# Patient Record
Sex: Female | Born: 1990
Health system: Southern US, Community
[De-identification: ages and names within clinical notes are randomized; demographics above are authoritative.]

## PROBLEM LIST (undated history)

## (undated) DIAGNOSIS — F329 Major depressive disorder, single episode, unspecified: Secondary | ICD-10-CM

## (undated) DIAGNOSIS — F32A Depression, unspecified: Secondary | ICD-10-CM

## (undated) DIAGNOSIS — G43909 Migraine, unspecified, not intractable, without status migrainosus: Secondary | ICD-10-CM

## (undated) DIAGNOSIS — R7303 Prediabetes: Secondary | ICD-10-CM

## (undated) DIAGNOSIS — F419 Anxiety disorder, unspecified: Secondary | ICD-10-CM

## (undated) DIAGNOSIS — E669 Obesity, unspecified: Secondary | ICD-10-CM

## (undated) DIAGNOSIS — K3184 Gastroparesis: Secondary | ICD-10-CM

## (undated) DIAGNOSIS — M797 Fibromyalgia: Secondary | ICD-10-CM

## (undated) HISTORY — DX: Gastroparesis: K31.84

## (undated) HISTORY — DX: Migraine, unspecified, not intractable, without status migrainosus: G43.909

## (undated) HISTORY — PX: WISDOM TOOTH EXTRACTION: SHX21

---
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
## 2008-07-13 ENCOUNTER — Other Ambulatory Visit: Admission: RE | Admit: 2008-07-13 | Discharge: 2008-07-13 | Payer: Self-pay | Admitting: Obstetrics and Gynecology

## 2010-09-21 ENCOUNTER — Emergency Department (HOSPITAL_COMMUNITY)
Admission: EM | Admit: 2010-09-21 | Discharge: 2010-09-21 | Disposition: A | Discharge status: Home / Self Care | Payer: BC Managed Care – PPO | Attending: Emergency Medicine | Admitting: Emergency Medicine

## 2010-09-21 DIAGNOSIS — S335XXA Sprain of ligaments of lumbar spine, initial encounter: Secondary | ICD-10-CM | POA: Insufficient documentation

## 2010-09-21 DIAGNOSIS — X58XXXA Exposure to other specified factors, initial encounter: Secondary | ICD-10-CM | POA: Insufficient documentation

## 2010-09-21 DIAGNOSIS — N390 Urinary tract infection, site not specified: Secondary | ICD-10-CM | POA: Insufficient documentation

## 2010-09-21 DIAGNOSIS — Y998 Other external cause status: Secondary | ICD-10-CM | POA: Insufficient documentation

## 2010-09-21 DIAGNOSIS — F17200 Nicotine dependence, unspecified, uncomplicated: Secondary | ICD-10-CM | POA: Insufficient documentation

## 2010-09-21 LAB — URINALYSIS, ROUTINE W REFLEX MICROSCOPIC
Bilirubin Urine: NEGATIVE
Hgb urine dipstick: NEGATIVE
Nitrite: NEGATIVE
Protein, ur: NEGATIVE mg/dL
Specific Gravity, Urine: 1.02 (ref 1.005–1.030)
Urobilinogen, UA: 0.2 mg/dL (ref 0.0–1.0)

## 2010-09-21 LAB — POCT PREGNANCY, URINE: Preg Test, Ur: NEGATIVE

## 2010-09-21 LAB — URINE MICROSCOPIC-ADD ON

## 2011-03-27 ENCOUNTER — Emergency Department (HOSPITAL_COMMUNITY)
Admission: EM | Admit: 2011-03-27 | Discharge: 2011-03-27 | Disposition: A | Discharge status: Home / Self Care | Payer: BC Managed Care – PPO | Attending: Emergency Medicine | Admitting: Emergency Medicine

## 2011-03-27 ENCOUNTER — Encounter: Payer: Self-pay | Admitting: Emergency Medicine

## 2011-03-27 DIAGNOSIS — H6690 Otitis media, unspecified, unspecified ear: Secondary | ICD-10-CM | POA: Insufficient documentation

## 2011-03-27 DIAGNOSIS — J3489 Other specified disorders of nose and nasal sinuses: Secondary | ICD-10-CM | POA: Insufficient documentation

## 2011-03-27 DIAGNOSIS — E669 Obesity, unspecified: Secondary | ICD-10-CM | POA: Insufficient documentation

## 2011-03-27 DIAGNOSIS — R51 Headache: Secondary | ICD-10-CM | POA: Insufficient documentation

## 2011-03-27 DIAGNOSIS — H9209 Otalgia, unspecified ear: Secondary | ICD-10-CM | POA: Insufficient documentation

## 2011-03-27 HISTORY — DX: Obesity, unspecified: E66.9

## 2011-03-27 HISTORY — DX: Anxiety disorder, unspecified: F41.9

## 2011-03-27 HISTORY — DX: Depression, unspecified: F32.A

## 2011-03-27 HISTORY — DX: Major depressive disorder, single episode, unspecified: F32.9

## 2011-03-27 MED ORDER — OXYCODONE-ACETAMINOPHEN 5-325 MG PO TABS
1.0000 | ORAL_TABLET | Freq: Once | ORAL | Status: AC
  Administered 2011-03-27: 1 via ORAL
  Filled 2011-03-27: qty 1

## 2011-03-27 MED ORDER — AMOXICILLIN-POT CLAVULANATE 875-125 MG PO TABS
1.0000 | ORAL_TABLET | ORAL | Status: AC
  Administered 2011-03-27: 1 via ORAL
  Filled 2011-03-27: qty 1

## 2011-03-27 MED ORDER — HYDROCODONE-ACETAMINOPHEN 5-325 MG PO TABS: 2.0000 | ORAL_TABLET | ORAL | Status: AC | PRN

## 2011-03-27 MED ORDER — ANTIPYRINE-BENZOCAINE 5.4-1.4 % OT SOLN
3.0000 [drp] | Freq: Once | OTIC | Status: AC
  Administered 2011-03-27: 4 [drp] via OTIC
  Filled 2011-03-27: qty 10

## 2011-03-27 MED ORDER — AMOXICILLIN-POT CLAVULANATE 875-125 MG PO TABS: 1.0000 | ORAL_TABLET | Freq: Two times a day (BID) | ORAL | Status: AC

## 2011-03-27 NOTE — ED Notes (Signed)
PT. REPORTS EAR ACHE THIS EVENING , NO DRAINAGE / DENIES INJURY , NO FEVER OR CHILLS.

## 2011-03-27 NOTE — ED Provider Notes (Signed)
Medical screening examination/treatment/procedure(s) were performed by non-physician practitioner and as supervising physician I was immediately available for consultation/collaboration.  Jamese Trauger M Corry Storie, MD 03/27/11 1749 

## 2011-03-27 NOTE — ED Notes (Signed)
NP at bedside.

## 2011-03-27 NOTE — ED Provider Notes (Signed)
History     CSN: [id number] Arrival date & time: 03/27/2011 12:58 AM   First MD Initiated Contact with Patient 03/27/11 0110      Chief Complaint  Patient presents with  . Otalgia    (Consider location/radiation/quality/duration/timing/severity/associated sxs/prior treatment) HPI Comments: Per the spouse.  She has chronic ear infections, reports she's had rhinitis for the past week, and about one hour ago, developed severe stabbing right ear pain.  She took 3 ibuprofen prior to arrival without any relief.  Crying in pain rocking holding her head  Patient is a 20 y.o. female presenting with ear pain. The history is provided by the patient.  Otalgia This is a new problem. The current episode started 1 to 2 hours ago. There is pain in the right ear. The problem occurs constantly. The problem has been gradually worsening. There has been no fever. The pain is at a severity of 10/10. The pain is severe. Associated symptoms include headaches and rhinorrhea. Pertinent negatives include no ear discharge, no hearing loss and no sore throat. Her past medical history is significant for chronic ear infection.    Past Medical History  Diagnosis Date  . Anxiety   . Depression   . Obesity     History reviewed. No pertinent past surgical history.  No family history on file.  History  Substance Use Topics  . Smoking status: Current Everyday Smoker  . Smokeless tobacco: Not on file  . Alcohol Use: No    OB History    Grav Para Term Preterm Abortions TAB SAB Ect Mult Living                  Review of Systems  Constitutional: Negative.  Negative for fever.  HENT: Positive for ear pain and rhinorrhea. Negative for hearing loss, sore throat, drooling and ear discharge.   Eyes: Negative.   Respiratory: Negative.   Cardiovascular: Negative.   Genitourinary: Negative.   Neurological: Positive for headaches.  Psychiatric/Behavioral: Negative.     Allergies  Sulfa antibiotics  Home  Medications   Current Outpatient Rx  Name Route Sig Dispense Refill  . BUPROPION HCL ER (XL) 150 MG PO TB24 Oral Take 150 mg by mouth daily.      Marland Kitchen CLONAZEPAM 1 MG PO TABS Oral Take 1 mg by mouth 3 (three) times daily as needed. For anxiety     . FLUOXETINE HCL 20 MG PO CAPS Oral Take 40 mg by mouth daily.      . AMOXICILLIN-POT CLAVULANATE 875-125 MG PO TABS Oral Take 1 tablet by mouth every 12 (twelve) hours. 14 tablet 0  . HYDROCODONE-ACETAMINOPHEN 5-325 MG PO TABS Oral Take 2 tablets by mouth every 4 (four) hours as needed for pain. 10 tablet 0    BP 156/101  Pulse 93  Temp(Src) 97.5 F (36.4 C) (Oral)  Resp 20  SpO2 95%  LMP 03/26/2011  Physical Exam  Constitutional: She is oriented to person, place, and time. She appears well-developed and well-nourished.  HENT:  Right Ear: There is swelling. No drainage or tenderness. No mastoid tenderness. Tympanic membrane is injected. A middle ear effusion is present. No hemotympanum.  Left Ear: Tympanic membrane normal.  Neck: Normal range of motion.  Cardiovascular: Normal rate.   Pulmonary/Chest: Effort normal.  Musculoskeletal: Normal range of motion.  Neurological: She is oriented to person, place, and time.  Skin: Skin is warm and dry.  Psychiatric: She has a normal mood and affect.    ED Course  Procedures (including critical care time)  Labs Reviewed - No data to display No results found.   1. Otitis media       MDM  URI with right middle ear effusion.  Pain.  Will treat with by mouth antibiotics, refer patient to ENT for followup        Arman Filter, NP 03/27/11 0138  Arman Filter, NP 03/27/11 0140

## 2014-09-03 ENCOUNTER — Encounter (INDEPENDENT_AMBULATORY_CARE_PROVIDER_SITE_OTHER): Payer: Self-pay

## 2014-09-03 ENCOUNTER — Encounter: Payer: Self-pay | Admitting: Primary Care

## 2014-09-03 ENCOUNTER — Ambulatory Visit (INDEPENDENT_AMBULATORY_CARE_PROVIDER_SITE_OTHER): Payer: BLUE CROSS/BLUE SHIELD | Admitting: Primary Care

## 2014-09-03 ENCOUNTER — Ambulatory Visit (INDEPENDENT_AMBULATORY_CARE_PROVIDER_SITE_OTHER)
Admission: RE | Admit: 2014-09-03 | Discharge: 2014-09-03 | Disposition: A | Discharge status: Home / Self Care | Payer: BLUE CROSS/BLUE SHIELD | Source: Ambulatory Visit | Attending: Primary Care | Admitting: Primary Care

## 2014-09-03 VITALS — BP 122/72 | HR 81 | Temp 98.5°F | Ht 67.5 in | Wt 190.1 lb

## 2014-09-03 DIAGNOSIS — F419 Anxiety disorder, unspecified: Secondary | ICD-10-CM

## 2014-09-03 DIAGNOSIS — G43001 Migraine without aura, not intractable, with status migrainosus: Secondary | ICD-10-CM

## 2014-09-03 DIAGNOSIS — M545 Low back pain, unspecified: Secondary | ICD-10-CM | POA: Insufficient documentation

## 2014-09-03 DIAGNOSIS — M5442 Lumbago with sciatica, left side: Secondary | ICD-10-CM

## 2014-09-03 DIAGNOSIS — M797 Fibromyalgia: Secondary | ICD-10-CM | POA: Insufficient documentation

## 2014-09-03 DIAGNOSIS — M791 Myalgia, unspecified site: Secondary | ICD-10-CM

## 2014-09-03 DIAGNOSIS — M5441 Lumbago with sciatica, right side: Secondary | ICD-10-CM

## 2014-09-03 DIAGNOSIS — F329 Major depressive disorder, single episode, unspecified: Secondary | ICD-10-CM

## 2014-09-03 DIAGNOSIS — M2550 Pain in unspecified joint: Secondary | ICD-10-CM | POA: Diagnosis not present

## 2014-09-03 DIAGNOSIS — F418 Other specified anxiety disorders: Secondary | ICD-10-CM

## 2014-09-03 DIAGNOSIS — G43809 Other migraine, not intractable, without status migrainosus: Secondary | ICD-10-CM

## 2014-09-03 DIAGNOSIS — G43909 Migraine, unspecified, not intractable, without status migrainosus: Secondary | ICD-10-CM | POA: Insufficient documentation

## 2014-09-03 DIAGNOSIS — F32A Depression, unspecified: Secondary | ICD-10-CM

## 2014-09-03 MED ORDER — AMITRIPTYLINE HCL 25 MG PO TABS: 25.0000 mg | ORAL_TABLET | Freq: Every day | ORAL | Status: DC

## 2014-09-03 MED ORDER — KETOROLAC TROMETHAMINE 60 MG/2ML IM SOLN
60.0000 mg | Freq: Once | INTRAMUSCULAR | Status: AC
  Administered 2014-09-03: 60 mg via INTRAMUSCULAR

## 2014-09-03 NOTE — Progress Notes (Signed)
Subjective:    Patient ID: Melissa Mendez, female    DOB: Nov 26, 1990, 26 y.o.   MRN: [id number]  HPI  Ms. Melissa Mendez is a 24 year old female who presents today to establish care and discuss the problems mentioned below. Will obtain old records.  1) Anxiety and Depression: Diagnosed in 2007. She was taking fluoxetine but has not had for several months. Follows with Dr. Toy Care and has an appointment next month. Denies SI/HI. She does not follow with therapist. Currently taking Xanax every night and sometimes twice-three times daily which is managed by Dr. Toy Care.  2) Migraines: Present for the past 4-5 years. She will get photophobia, phonophobia, and nausea; and will get them once-twice monthly.  She will take excedrin migraine will help if she catches the headache early enough. She Topomax in the past for prevention of headacehs without help, but had not tried TCA's or beta blockers.  3) Elevated blood pressure readings: History of, but lost 85 pounds and has not had any elevated readings since.  4) Joint pain/Muscle pain: Present to bilateral for the past 4-5 years. History of back pain in 2009 with diagnosis of bulging disc, but was not a candidate for surgery. Reports radiculopathy occasionally now, but more frequently when she weighed more. Back pain present from top of neck down to lumbar spine. She was seeing Dr. Nevada Crane in [place] who referred her to an orthopedist. She was last seen at [facility] 1 year ago and had a normal pelvic MRI. She has been taking gabapentin with some relief. Over the years her pain has worsened, especially over past few months. She has multiple pain/tender points present to her neck, back, hips. Denies history of inflammatory disorders, and has also never been tested.  Review of Systems  Constitutional: Positive for fatigue. Negative for unexpected weight change.  HENT: Negative for rhinorrhea.   Respiratory: Negative for cough and shortness of breath.     Cardiovascular: Negative for chest pain.  Gastrointestinal: Negative for diarrhea and constipation.  Genitourinary: Negative for dysuria and frequency.  Skin: Negative for rash.  Allergic/Immunologic: Positive for environmental allergies.  Neurological: Positive for headaches. Negative for dizziness.  Psychiatric/Behavioral:       See HPI       Past Medical History  Diagnosis Date  . Anxiety   . Depression   . Obesity   . Migraines     History   Social History  . Marital Status: Unknown    Spouse Name: N/A  . Number of Children: N/A  . Years of Education: N/A   Occupational History  . Not on file.   Social History Main Topics  . Smoking status: Current Every Day Smoker  . Smokeless tobacco: Not on file  . Alcohol Use: No  . Drug Use: No  . Sexual Activity: Not on file   Other Topics Concern  . Not on file   Social History Narrative   Single.   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   Enjoys painting, being with her friends and boyfriend.          Past Surgical History  Procedure Laterality Date  . Tonsillectomy and adenoidectomy  1993  . Wisdom tooth extraction      Family History  Problem Relation Age of Onset  . Hypertension Father     Allergies  Allergen Reactions  . Sulfa Antibiotics Other (See Comments)    unknown    Current Outpatient  Prescriptions on File Prior to Visit  Medication Sig Dispense Refill  . FLUoxetine (PROZAC) 20 MG capsule Take 40 mg by mouth daily.       No current facility-administered medications on file prior to visit.    BP 122/72 mmHg  Pulse 81  Temp(Src) 98.5 F (36.9 C) (Oral)  Ht 5' 7.5" (1.715 m)  Wt 190 lb 1.9 oz (86.238 kg)  BMI 29.32 kg/m2  SpO2 98%  LMP 08/19/2014    Objective:   Physical Exam  Constitutional: She is oriented to person, place, and time. She appears well-nourished.  HENT:  Right Ear: Tympanic membrane and ear canal normal.  Left Ear: Tympanic  membrane and ear canal normal.  Nose: Nose normal.  Mouth/Throat: Oropharynx is clear and moist.  Eyes: Conjunctivae and EOM are normal. Pupils are equal, round, and reactive to light.  Neck: Neck supple. No thyromegaly present.  Cardiovascular: Normal rate and regular rhythm.   Pulmonary/Chest: Effort normal and breath sounds normal.  Abdominal: Soft. Bowel sounds are normal. There is no tenderness.  Musculoskeletal:       Right hip: She exhibits tenderness.       Left hip: She exhibits tenderness.       Cervical back: She exhibits tenderness.       Thoracic back: She exhibits tenderness.       Lumbar back: She exhibits tenderness.  Multiple pain/tender points present to:  Posterior: neck bilaterally, mid thoracic bilaterally, lower back bilaterally, hips bilaterally. Anterior: hips bilaterally Total 11 painful/tender points.  Lymphadenopathy:    She has no cervical adenopathy.  Neurological: She is alert and oriented to person, place, and time.  Skin: Skin is warm and dry.  Psychiatric: She has a normal mood and affect.          Assessment & Plan:

## 2014-09-03 NOTE — Patient Instructions (Signed)
Complete lab work prior to leaving today. I will notify you of your results. Complete xray(s) prior to leaving today. I will contact you regarding your results. Stop Gabapentin.  Start Amitriptyline for muscle/suspected fibromyalgia pain. Take 1 tablet by mouth at bedtime. Follow up in 4 weeks for re-evaluation. It was a pleasure to meet you today! Please don't hesitate to call me with any questions. Welcome to [facility]!  Fibromyalgia Fibromyalgia is a disorder that is often misunderstood. It is associated with muscular pains and tenderness that comes and goes. It is often associated with fatigue and sleep disturbances. Though it tends to be long-lasting, fibromyalgia is not life-threatening. CAUSES  The exact cause of fibromyalgia is unknown. People with certain gene types are predisposed to developing fibromyalgia and other conditions. Certain factors can play a role as triggers, such as:  Spine disorders.  Arthritis.  Severe injury (trauma) and other physical stressors.  Emotional stressors. SYMPTOMS   The main symptom is pain and stiffness in the muscles and joints, which can vary over time.  Sleep and fatigue problems. Other related symptoms may include:  Bowel and bladder problems.  Headaches.  Visual problems.  Problems with odors and noises.  Depression or mood changes.  Painful periods (dysmenorrhea).  Dryness of the skin or eyes. DIAGNOSIS  There are no specific tests for diagnosing fibromyalgia. Patients can be diagnosed accurately from the specific symptoms they have. The diagnosis is made by determining that nothing else is causing the problems. TREATMENT  There is no cure. Management includes medicines and an active, healthy lifestyle. The goal is to enhance physical fitness, decrease pain, and improve sleep. HOME CARE INSTRUCTIONS   Only take over-the-counter or prescription medicines as directed by your caregiver. Sleeping pills, tranquilizers, and pain  medicines may make your problems worse.  Low-impact aerobic exercise is very important and advised for treatment. At first, it may seem to make pain worse. Gradually increasing your tolerance will overcome this feeling.  Learning relaxation techniques and how to control stress will help you. Biofeedback, visual imagery, hypnosis, muscle relaxation, yoga, and meditation are all options.  Anti-inflammatory medicines and physical therapy may provide short-term help.  Acupuncture or massage treatments may help.  Take muscle relaxant medicines as suggested by your caregiver.  Avoid stressful situations.  Plan a healthy lifestyle. This includes your diet, sleep, rest, exercise, and friends.  Find and practice a hobby you enjoy.  Join a fibromyalgia support group for interaction, ideas, and sharing advice. This may be helpful. SEEK MEDICAL CARE IF:  You are not having good results or improvement from your treatment. FOR MORE INFORMATION  National Fibromyalgia Association: www.fmaware.[place]: www.arthritis.org Document Released: 04/06/2005 Document Revised: 06/29/2011 Document Reviewed: 07/17/2009 [facility] Patient Information 2015 [place], [place]. This information is not intended to replace advice given to you by your health care provider. Make sure you discuss any questions you have with your health care provider.

## 2014-09-03 NOTE — Assessment & Plan Note (Signed)
Denies recent injury or trauma. 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 Will obtain xray of lumbar spine due to presentation of muscle/back pain (amongst other areas of pain) to rule out any changes.

## 2014-09-03 NOTE — Progress Notes (Signed)
Pre visit review using our clinic review tool, if applicable. No additional management support is needed unless otherwise documented below in the visit note. 

## 2014-09-03 NOTE — Assessment & Plan Note (Signed)
Present for the past 4-5 years.  +photobhobia, phonophobia, nausea. Will get once-three times monthly, takes Excedrin migraine with little help. Once trialed on Topamax without help. Start Amitriptyline for suspected fibromyalgia pain and also for migraine prevention. Follow up in 4 weeks.

## 2014-09-03 NOTE — Assessment & Plan Note (Signed)
Follows with Dr. Toy Care. Once on Fluoxitine but does not like medication and has not had for several months. Xanax PRN per Dr. Toy Care and will take 1-3 times daily. Next appointment is in June. Amitriptyline started today for fibromyalgia and migraine prevention.

## 2014-09-03 NOTE — Assessment & Plan Note (Signed)
Suspect fibromyalgia. 11 tender/painful points that qualify for a diagnosis. She's tried Cymbalta in the past which helped her pain, but also came with side effects. Pain continues despite loss of 85 pounds. Start Amitriptyline 25 mg tablets at bedtime for fibromyalgia pain and also migraine prevention.  Follow up in 4 weeks.

## 2014-09-04 LAB — CBC WITH DIFFERENTIAL/PLATELET
BASOS PCT: 0.8 % (ref 0.0–3.0)
Basophils Absolute: 0.1 10*3/uL (ref 0.0–0.1)
EOS PCT: 5.2 % — AB (ref 0.0–5.0)
Eosinophils Absolute: 0.3 10*3/uL (ref 0.0–0.7)
HCT: 40.1 % (ref 36.0–46.0)
Hemoglobin: 13.8 g/dL (ref 12.0–15.0)
LYMPHS PCT: 45.4 % (ref 12.0–46.0)
Lymphs Abs: 3 10*3/uL (ref 0.7–4.0)
MCHC: 34.4 g/dL (ref 30.0–36.0)
MCV: 86.1 fl (ref 78.0–100.0)
MONOS PCT: 6.5 % (ref 3.0–12.0)
Monocytes Absolute: 0.4 10*3/uL (ref 0.1–1.0)
NEUTROS PCT: 42.1 % — AB (ref 43.0–77.0)
Neutro Abs: 2.7 10*3/uL (ref 1.4–7.7)
Platelets: 219 10*3/uL (ref 150.0–400.0)
RBC: 4.65 Mil/uL (ref 3.87–5.11)
RDW: 12.2 % (ref 11.5–15.5)
WBC: 6.5 10*3/uL (ref 4.0–10.5)

## 2014-09-04 LAB — SEDIMENTATION RATE: SED RATE: 6 mm/h (ref 0–22)

## 2014-09-05 ENCOUNTER — Encounter: Payer: Self-pay | Admitting: *Deleted

## 2014-10-01 ENCOUNTER — Ambulatory Visit: Payer: BLUE CROSS/BLUE SHIELD | Admitting: Primary Care

## 2014-10-23 ENCOUNTER — Encounter: Payer: Self-pay | Admitting: Primary Care

## 2014-10-27 NOTE — Telephone Encounter (Signed)
looks like pt has appt on Tues with [place].

## 2014-10-29 ENCOUNTER — Other Ambulatory Visit: Payer: Self-pay | Admitting: Primary Care

## 2014-10-30 ENCOUNTER — Ambulatory Visit: Payer: BLUE CROSS/BLUE SHIELD | Admitting: Primary Care

## 2014-11-07 ENCOUNTER — Telehealth: Payer: Self-pay | Admitting: Primary Care

## 2014-11-07 NOTE — Telephone Encounter (Signed)
Pt called wanting to get a referral to a rheumatologist

## 2014-11-07 NOTE — Telephone Encounter (Signed)
Please notify patient that I'm happy to make the referral, however, I will need to see her in the office to do some preliminary work up. She was scheduled last week but had car trouble. I requested that she re-schedule. Please have her reschedule so we can discuss. Thanks.

## 2014-11-08 NOTE — Telephone Encounter (Signed)
Called and notified patient of Kate's comments. Patient verbalized understanding. Patient will call back and reschedule follow up after she return from the beach.

## 2014-11-28 ENCOUNTER — Encounter: Payer: Self-pay | Admitting: Primary Care

## 2014-11-28 ENCOUNTER — Other Ambulatory Visit: Payer: Self-pay | Admitting: Primary Care

## 2014-11-28 ENCOUNTER — Ambulatory Visit (INDEPENDENT_AMBULATORY_CARE_PROVIDER_SITE_OTHER): Payer: BLUE CROSS/BLUE SHIELD | Admitting: Primary Care

## 2014-11-28 VITALS — BP 116/72 | HR 88 | Temp 98.3°F | Ht 67.5 in | Wt 188.8 lb

## 2014-11-28 DIAGNOSIS — M797 Fibromyalgia: Secondary | ICD-10-CM

## 2014-11-28 DIAGNOSIS — F329 Major depressive disorder, single episode, unspecified: Secondary | ICD-10-CM

## 2014-11-28 DIAGNOSIS — F419 Anxiety disorder, unspecified: Secondary | ICD-10-CM

## 2014-11-28 DIAGNOSIS — F418 Other specified anxiety disorders: Secondary | ICD-10-CM | POA: Diagnosis not present

## 2014-11-28 DIAGNOSIS — M2550 Pain in unspecified joint: Secondary | ICD-10-CM

## 2014-11-28 MED ORDER — METHOCARBAMOL 500 MG PO TABS: 500.0000 mg | ORAL_TABLET | Freq: Three times a day (TID) | ORAL | Status: DC | PRN

## 2014-11-28 NOTE — Assessment & Plan Note (Addendum)
Symptoms continue. Was removed from amitriptyline from psych and re-initiated on Cymbalta again with some improvement. She would like a referral to a specialist for her joint and muscle aches. Will do rheumatoid work up and send her to rheumatology for further eval. Currently managed on neurontin, will add robaxin for pain.

## 2014-11-28 NOTE — Progress Notes (Signed)
Pre visit review using our clinic review tool, if applicable. No additional management support is needed unless otherwise documented below in the visit note. 

## 2014-11-28 NOTE — Patient Instructions (Addendum)
Complete lab work prior to leaving today. I will notify you of your results.  You may take Robaxin three times daily as needed for fibromyalgia pain.  Stop by the front and speak with Rosaria Ferries regarding your referral to rheumatology.  Follow up in 3 months for re-evaluation.  It was a pleasure to see you today!

## 2014-11-28 NOTE — Assessment & Plan Note (Signed)
Managed by psych, improvement in mood with cymbalta.

## 2014-11-28 NOTE — Progress Notes (Signed)
Subjective:    Patient ID: Melissa Mendez, female    DOB: 17-Nov-1990, 24 y.o.   MRN: [id number]  HPI  Ms. Melissa Mendez is a 24 year old female who presents today for follow up of fibromyalgia. Diagnosed with fibromyalgia last visit as she had 11 tender/painful points. She was initiated on amytriptlyine 25 mg at bedtime for fibromyalgia pain last visit and was told to follow up in 1 month for re-evaluation.  Since her last visit the pain and fatigue has caused her to lose her job due to inability to function at work. She will feel tired after a full night's sleep. Her pain is present to her mid back, bilateral lateral trunk, bilateral arms and hips. She was removed from the amitriptyline and placed on Cymbalta 60 mg twice daily by her psychiatrist. She was once managed on cymbalta before but reported during our initial visit that it didn't help. She does notice a small difference since the Cymbalta initiation.   She is requesting referral to a specialist for her fibromyalgia.   Review of Systems  Constitutional: Negative for fever and chills.  Respiratory: Negative for shortness of breath.   Cardiovascular: Negative for chest pain.  Musculoskeletal: Positive for myalgias, back pain and arthralgias. Negative for joint swelling.  Neurological: Negative for numbness.       Past Medical History  Diagnosis Date  . Anxiety   . Depression   . Obesity   . Migraines     Social History   Social History  . Marital Status: Unknown    Spouse Name: N/A  . Number of Children: N/A  . Years of Education: N/A   Occupational History  . Not on file.   Social History Main Topics  . Smoking status: Current Every Day Smoker  . Smokeless tobacco: Not on file  . Alcohol Use: No  . Drug Use: No  . Sexual Activity: Not on file   Other Topics Concern  . Not on file   Social History Narrative   Single.   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   Enjoys painting, being  with her friends and boyfriend.          Past Surgical History  Procedure Laterality Date  . Tonsillectomy and adenoidectomy  1993  . Wisdom tooth extraction      Family History  Problem Relation Age of Onset  . Hypertension Father     Allergies  Allergen Reactions  . Sulfa Antibiotics Other (See Comments)    unknown    Current Outpatient Prescriptions on File Prior to Visit  Medication Sig Dispense Refill  . ALPRAZolam (XANAX) 1 MG tablet Take 1 mg by mouth at bedtime as needed for anxiety.     No current facility-administered medications on file prior to visit.    BP 116/72 mmHg  Pulse 88  Temp(Src) 98.3 F (36.8 C) (Oral)  Ht 5' 7.5" (1.715 m)  Wt 188 lb 12.8 oz (85.639 kg)  BMI 29.12 kg/m2  SpO2 96%  LMP 11/10/2014    Objective:   Physical Exam  Constitutional: She is oriented to person, place, and time. She appears well-nourished.  Cardiovascular: Normal rate and regular rhythm.   Pulmonary/Chest: Effort normal and breath sounds normal.  Musculoskeletal: Normal range of motion.  Tender to thoracic and lumbar back, hips, and arms today.  Neurological: She is alert and oriented to person, place, and time.  Skin: Skin is warm and dry.  Psychiatric: She has a normal mood and affect.          Assessment & Plan:

## 2014-11-29 LAB — C-REACTIVE PROTEIN: CRP: 0.1 mg/dL — ABNORMAL LOW (ref 0.5–20.0)

## 2014-11-29 LAB — SEDIMENTATION RATE: SED RATE: 7 mm/h (ref 0–22)

## 2014-11-29 LAB — ANA: ANA: NEGATIVE

## 2014-11-29 LAB — RHEUMATOID FACTOR: Rhuematoid fact SerPl-aCnc: <10 IU/mL (ref <=14)

## 2015-01-03 ENCOUNTER — Telehealth: Payer: Self-pay | Admitting: Primary Care

## 2015-01-03 NOTE — Telephone Encounter (Signed)
Noted. I am happy to send her to pain management if she's open to that. I know she refused in the past.

## 2015-01-03 NOTE — Telephone Encounter (Signed)
Pt called and wanted to have a referral for fibromyalgia to Dr. Merlene Laughter.  He is with [facility] in [place].  Spoke with Dr. Freddie Apley office mgr Raquel Sarna and she states they are not accepting new patients for fibromyalgia or for pain management.  Dr. Merlene Laughter is only seeing Neurology and Sleep disorders.  Called pt back at [phone] and left message to inform / lt

## 2015-01-18 ENCOUNTER — Other Ambulatory Visit: Payer: Self-pay | Admitting: Primary Care

## 2015-01-18 ENCOUNTER — Encounter: Payer: Self-pay | Admitting: Primary Care

## 2015-01-18 DIAGNOSIS — M797 Fibromyalgia: Secondary | ICD-10-CM

## 2015-03-18 ENCOUNTER — Encounter: Payer: BLUE CROSS/BLUE SHIELD | Admitting: Primary Care

## 2015-03-18 ENCOUNTER — Encounter: Payer: Self-pay | Admitting: Primary Care

## 2015-03-18 ENCOUNTER — Ambulatory Visit (INDEPENDENT_AMBULATORY_CARE_PROVIDER_SITE_OTHER): Payer: BLUE CROSS/BLUE SHIELD | Admitting: Primary Care

## 2015-03-18 VITALS — BP 158/100 | HR 96 | Temp 98.1°F | Ht 67.5 in | Wt 192.4 lb

## 2015-03-18 DIAGNOSIS — R059 Cough, unspecified: Secondary | ICD-10-CM

## 2015-03-18 DIAGNOSIS — R05 Cough: Secondary | ICD-10-CM

## 2015-03-18 MED ORDER — AZITHROMYCIN 250 MG PO TABS: ORAL_TABLET | ORAL | Status: DC

## 2015-03-18 MED ORDER — HYDROCODONE-HOMATROPINE 5-1.5 MG/5ML PO SYRP: 5.0000 mL | ORAL_SOLUTION | Freq: Every evening | ORAL | Status: DC | PRN

## 2015-03-18 NOTE — Patient Instructions (Signed)
Start Azithromycin antibiotics for bronchitis. Take 2 tablets by mouth today, then 1 tablet daily for 4 additional days.   You may take the Hycodan at bedtime as needed for cough and rest. Try taking Delsym during the day for daytime cough.  Increase consumption of fluids. Rest.  It was a pleasure to see you today!

## 2015-03-18 NOTE — Progress Notes (Signed)
Subjective:    Patient ID: Melissa Mendez, female    DOB: [birth year], 24 y.o.   MRN: [id number]  HPI  Melissa Mendez is a 24 year old female who presents today with a chief complaint of cough. She also reports chest congestion, fevers, nasal congestion. Her symptoms have been present since Tuesday November 22nd. She started feeling better for 2 days days, then began coughing and feeling worse on Saturday this past weekend. She's taken Robitussin and Mucinex DM with temporary relief. Her cough is worse at night. Overall she's feeling tired, weak, and is irritated by the cough.  Review of Systems  Constitutional: Positive for fever, chills and fatigue.  HENT: Positive for congestion, ear pain and sore throat. Negative for sinus pressure.   Respiratory: Positive for cough. Negative for shortness of breath.   Cardiovascular: Negative for chest pain.  Gastrointestinal: Negative for nausea.  Musculoskeletal: Positive for myalgias.       Past Medical History  Diagnosis Date  . Anxiety   . Depression   . Obesity   . Migraines     Social History   Social History  . Marital Status: Unknown    Spouse Name: N/A  . Number of Children: N/A  . Years of Education: N/A   Occupational History  . Not on file.   Social History Main Topics  . Smoking status: Current Every Day Smoker  . Smokeless tobacco: Not on file  . Alcohol Use: No  . Drug Use: No  . Sexual Activity: Not on file   Other Topics Concern  . Not on file   Social History Narrative   Single.   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   Enjoys painting, being with her friends and boyfriend.          Past Surgical History  Procedure Laterality Date  . Tonsillectomy and adenoidectomy  1993  . Wisdom tooth extraction      Family History  Problem Relation Age of Onset  . Hypertension Father     Allergies  Allergen Reactions  . Sulfa Antibiotics Other (See Comments)    unknown     Current Outpatient Prescriptions on File Prior to Visit  Medication Sig Dispense Refill  . ALPRAZolam (XANAX) 1 MG tablet Take 1 mg by mouth at bedtime as needed for anxiety.    Marland Kitchen amphetamine-dextroamphetamine (ADDERALL) 20 MG tablet   0  . DULoxetine (CYMBALTA) 60 MG capsule TAKE 2 CAPSULES BY MOUTH EVERY DAY. WE ARE GRADUALLY COMING OFF PROZAC  12  . gabapentin (NEURONTIN) 600 MG tablet Take 600 mg by mouth 2 (two) times daily.   11   No current facility-administered medications on file prior to visit.    BP 158/100 mmHg  Pulse 96  Temp(Src) 98.1 F (36.7 C) (Oral)  Ht 5' 7.5" (1.715 m)  Wt 192 lb 6.4 oz (87.272 kg)  BMI 29.67 kg/m2  SpO2 98%  LMP 03/01/2015    Objective:   Physical Exam  Constitutional: She appears well-nourished. She appears ill.  HENT:  Right Ear: Tympanic membrane and ear canal normal.  Left Ear: Tympanic membrane and ear canal normal.  Nose: Nose normal.  Mouth/Throat: Oropharynx is clear and moist.  Eyes: Conjunctivae are normal. Pupils are equal, round, and reactive to light.  Neck: Neck supple.  Cardiovascular: Normal rate and regular rhythm.   Pulmonary/Chest: Effort normal and breath sounds normal.  Lymphadenopathy:    She has cervical adenopathy.  Skin: Skin is warm and dry.          Assessment & Plan:  Cough:  Present for 1 week, now worse with fatigue, fevers, increased cough. Temporary improvement with OTC's.  Lungs overall clear, ears with redness/irritation without bulging. Will provide Zpak today for symptoms as she appears ill. RX printed for Hycodan cough syrup to use HS prn. Delsym for daytime cough. Fluids, rest. Follow up PRN.

## 2015-03-21 NOTE — Progress Notes (Signed)
erroneous encounter, disregard

## 2015-06-10 ENCOUNTER — Emergency Department (HOSPITAL_COMMUNITY)
Admission: EM | Admit: 2015-06-10 | Discharge: 2015-06-10 | Disposition: A | Discharge status: Home / Self Care | Payer: BLUE CROSS/BLUE SHIELD | Attending: Emergency Medicine | Admitting: Emergency Medicine

## 2015-06-10 ENCOUNTER — Encounter (HOSPITAL_COMMUNITY): Payer: Self-pay | Admitting: Emergency Medicine

## 2015-06-10 DIAGNOSIS — L0201 Cutaneous abscess of face: Secondary | ICD-10-CM | POA: Insufficient documentation

## 2015-06-10 DIAGNOSIS — E669 Obesity, unspecified: Secondary | ICD-10-CM | POA: Diagnosis not present

## 2015-06-10 DIAGNOSIS — F419 Anxiety disorder, unspecified: Secondary | ICD-10-CM | POA: Diagnosis not present

## 2015-06-10 DIAGNOSIS — F329 Major depressive disorder, single episode, unspecified: Secondary | ICD-10-CM | POA: Insufficient documentation

## 2015-06-10 DIAGNOSIS — R51 Headache: Secondary | ICD-10-CM | POA: Diagnosis present

## 2015-06-10 DIAGNOSIS — L539 Erythematous condition, unspecified: Secondary | ICD-10-CM | POA: Insufficient documentation

## 2015-06-10 DIAGNOSIS — Z79899 Other long term (current) drug therapy: Secondary | ICD-10-CM | POA: Diagnosis not present

## 2015-06-10 DIAGNOSIS — F17200 Nicotine dependence, unspecified, uncomplicated: Secondary | ICD-10-CM | POA: Diagnosis not present

## 2015-06-10 DIAGNOSIS — G43909 Migraine, unspecified, not intractable, without status migrainosus: Secondary | ICD-10-CM | POA: Insufficient documentation

## 2015-06-10 MED ORDER — OXYCODONE-ACETAMINOPHEN 5-325 MG PO TABS: 2.0000 | ORAL_TABLET | ORAL | Status: DC | PRN

## 2015-06-10 MED ORDER — CLINDAMYCIN HCL 300 MG PO CAPS: 300.0000 mg | ORAL_CAPSULE | Freq: Three times a day (TID) | ORAL | Status: DC

## 2015-06-10 MED ORDER — HYDROCODONE-ACETAMINOPHEN 5-325 MG PO TABS: 2.0000 | ORAL_TABLET | ORAL | Status: DC | PRN

## 2015-06-10 MED ORDER — PREDNISONE 50 MG PO TABS
60.0000 mg | ORAL_TABLET | Freq: Once | ORAL | Status: AC
  Administered 2015-06-10: 60 mg via ORAL
  Filled 2015-06-10: qty 1

## 2015-06-10 MED ORDER — HYDROCODONE-ACETAMINOPHEN 5-325 MG PO TABS
1.0000 | ORAL_TABLET | Freq: Once | ORAL | Status: AC
  Administered 2015-06-10: 1 via ORAL
  Filled 2015-06-10: qty 1

## 2015-06-10 MED ORDER — CLINDAMYCIN HCL 150 MG PO CAPS
300.0000 mg | ORAL_CAPSULE | Freq: Once | ORAL | Status: AC
  Administered 2015-06-10: 300 mg via ORAL
  Filled 2015-06-10: qty 2

## 2015-06-10 NOTE — ED Provider Notes (Signed)
CSN: [id number]     Arrival date & time 06/10/15  1910 History  By signing my name below, I, Melissa Mendez, attest that this documentation has been prepared under the direction and in the presence of Tanna Furry, MD. Electronically Signed: Doran Mendez, ED Scribe. 06/10/2015. 9:45 PM.   Chief Complaint  Patient presents with  . Facial Pain   The history is provided by the patient. No language interpreter was used.   HPI Comments: Melissa Mendez is a 25 y.o. female with a PMHx of anxiety, depression, obesity and migraines who presents to the Emergency Department complaining of right facial pain, swelling and redness that began 3 days ago. Pt reports that her symptoms started as a painful pimple on her right check. However, this morning she had swelling up to her eye. Pt reports she has reduced swelling while here in the ED. She also states that the pimple had "poped today with green and yellow discharge".  Pt denies any fevers, chills, nausea, vomiting or any other symptoms at this time. Pt is allergic to sulfur.     Past Medical History  Diagnosis Date  . Anxiety   . Depression   . Obesity   . Migraines    Past Surgical History  Procedure Laterality Date  . Tonsillectomy and adenoidectomy  1993  . Wisdom tooth extraction     Family History  Problem Relation Age of Onset  . Hypertension Father    Social History  Substance Use Topics  . Smoking status: Current Every Day Smoker  . Smokeless tobacco: None  . Alcohol Use: No   OB History    No data available     Review of Systems  Constitutional: Negative for fever, chills, diaphoresis, appetite change and fatigue.  HENT: Negative for mouth sores, sore throat and trouble swallowing.   Eyes: Negative for visual disturbance.  Respiratory: Negative for cough, chest tightness, shortness of breath and wheezing.   Cardiovascular: Negative for chest pain.  Gastrointestinal: Negative for nausea, vomiting, abdominal pain, diarrhea  and abdominal distention.  Endocrine: Negative for polydipsia, polyphagia and polyuria.  Genitourinary: Negative for dysuria, frequency and hematuria.  Musculoskeletal: Negative for gait problem.  Skin: Positive for color change and wound. Negative for pallor and rash.  Neurological: Negative for dizziness, syncope, light-headedness and headaches.  Hematological: Does not bruise/bleed easily.  Psychiatric/Behavioral: Negative for behavioral problems and confusion.  All other systems reviewed and are negative.  Allergies  Sulfa antibiotics  Home Medications   Prior to Admission medications   Medication Sig Start Date End Date Taking? Authorizing Provider  ALPRAZolam Duanne Moron) 1 MG tablet Take 1 mg by mouth at bedtime as needed for anxiety.   Yes Historical Provider, MD  amphetamine-dextroamphetamine (ADDERALL) 20 MG tablet Take 20 mg by mouth daily as needed.  11/19/14  Yes Historical Provider, MD  DULoxetine (CYMBALTA) 60 MG capsule TAKE 2 CAPSULES BY MOUTH EVERY DAY. WE ARE GRADUALLY COMING OFF PROZAC 11/07/14  Yes Historical Provider, MD  gabapentin (NEURONTIN) 800 MG tablet Take 800 mg by mouth 4 (four) times daily.   Yes Historical Provider, MD  ibuprofen (ADVIL,MOTRIN) 200 MG tablet Take 800 mg by mouth every 6 (six) hours as needed for mild pain or moderate pain.   Yes Historical Provider, MD  medroxyPROGESTERone (DEPO-PROVERA) 150 MG/ML injection INJECT 1 MILLILITER (150 MG) BY INTRAMUSCULAR ROUTE EVERY 3 MONTHS 05/21/15  Yes Historical Provider, MD  clindamycin (CLEOCIN) 300 MG capsule Take 1 capsule (300 mg total) by mouth  3 (three) times daily. 06/10/15   Tanna Furry, MD  HYDROcodone-acetaminophen (NORCO/VICODIN) 5-325 MG tablet Take 2 tablets by mouth every 4 (four) hours as needed. 06/10/15   Tanna Furry, MD   BP 133/69 mmHg  Pulse 70  Temp(Src) 98.5 F (36.9 C) (Oral)  Resp 18  Ht 5\' 7"  (1.702 m)  Wt 195 lb (88.451 kg)  BMI 30.53 kg/m2  SpO2 100%  LMP 05/19/2015   Physical  Exam  Constitutional: She is oriented to person, place, and time. She appears well-developed and well-nourished. No distress.  HENT:  Head: Normocephalic.  6cm size erythema with central sinus, tense distension of tissue by manual.   Eyes: Conjunctivae are normal. Pupils are equal, round, and reactive to light. No scleral icterus.  Neck: Normal range of motion. Neck supple. No thyromegaly present.  right anterior cervical prominens   Cardiovascular: Normal rate and regular rhythm.  Exam reveals no gallop and no friction rub.   No murmur heard. Pulmonary/Chest: Effort normal and breath sounds normal. No respiratory distress. She has no wheezes. She has no rales.  Abdominal: Soft. Bowel sounds are normal. She exhibits no distension. There is no tenderness. There is no rebound.  Musculoskeletal: Normal range of motion.  Neurological: She is alert and oriented to person, place, and time.  Skin: Skin is warm and dry. No rash noted.  Psychiatric: She has a normal mood and affect. Her behavior is normal.  Nursing note and vitals reviewed.   ED Course  Procedures   DIAGNOSTIC STUDIES: Oxygen Saturation is 99% on room air, normal by my interpretation.    COORDINATION OF CARE: 9:33 PM Will give abx and pain medication. Discussed treatment plan with pt at bedside and pt agreed to plan.  Labs Review Labs Reviewed - No data to display   MDM   Final diagnoses:  Facial abscess   Bedside ultrasound does not show some trace fluid collection. Bimanual exam shows tense tissue, however no expression of fluid. Plan his antibiotics, single dose steroid, warm compresses. Recheck with any failure to improve or worsening.   I personally performed the services described in this documentation, which was scribed in my presence. The recorded information has been reviewed and is accurate.    Tanna Furry, MD 06/10/15 2245

## 2015-06-10 NOTE — ED Notes (Signed)
Pt reports swelling to her face that began as a pimple 3-4 days ago. She has mashed it and now it is reddened and swollen including her lower eyelid and cheek

## 2015-06-10 NOTE — Discharge Instructions (Signed)
Apply moist heat with warm washcloth/heating towel/heating pads 4 times per day. Gentle massage of the area towards the center. Return to ER with any worsening including worsening pain increasing size or worsening pain.    Abscess An abscess is an infected area that contains a collection of pus and debris.It can occur in almost any part of the body. An abscess is also known as a furuncle or boil. CAUSES  An abscess occurs when tissue gets infected. This can occur from blockage of oil or sweat glands, infection of hair follicles, or a minor injury to the skin. As the body tries to fight the infection, pus collects in the area and creates pressure under the skin. This pressure causes pain. People with weakened immune systems have difficulty fighting infections and get certain abscesses more often.  SYMPTOMS Usually an abscess develops on the skin and becomes a painful mass that is red, warm, and tender. If the abscess forms under the skin, you may feel a moveable soft area under the skin. Some abscesses break open (rupture) on their own, but most will continue to get worse without care. The infection can spread deeper into the body and eventually into the bloodstream, causing you to feel ill.  DIAGNOSIS  Your caregiver will take your medical history and perform a physical exam. A sample of fluid may also be taken from the abscess to determine what is causing your infection. TREATMENT  Your caregiver may prescribe antibiotic medicines to fight the infection. However, taking antibiotics alone usually does not cure an abscess. Your caregiver may need to make a small cut (incision) in the abscess to drain the pus. In some cases, gauze is packed into the abscess to reduce pain and to continue draining the area. HOME CARE INSTRUCTIONS   Only take over-the-counter or prescription medicines for pain, discomfort, or fever as directed by your caregiver.  If you were prescribed antibiotics, take them as  directed. Finish them even if you start to feel better.  If gauze is used, follow your caregiver's directions for changing the gauze.  To avoid spreading the infection:  Keep your draining abscess covered with a bandage.  Wash your hands well.  Do not share personal care items, towels, or whirlpools with others.  Avoid skin contact with others.  Keep your skin and clothes clean around the abscess.  Keep all follow-up appointments as directed by your caregiver. SEEK MEDICAL CARE IF:   You have increased pain, swelling, redness, fluid drainage, or bleeding.  You have muscle aches, chills, or a general ill feeling.  You have a fever. MAKE SURE YOU:   Understand these instructions.  Will watch your condition.  Will get help right away if you are not doing well or get worse.   This information is not intended to replace advice given to you by your health care provider. Make sure you discuss any questions you have with your health care provider.   Document Released: 01/14/2005 Document Revised: 10/06/2011 Document Reviewed: 06/19/2011 Elsevier Interactive Patient Education [employer].

## 2015-06-10 NOTE — ED Notes (Signed)
Pt c/o red, swelling and pain area to the rt cheek since 3 days.

## 2015-06-15 ENCOUNTER — Emergency Department (HOSPITAL_COMMUNITY)
Admission: EM | Admit: 2015-06-15 | Discharge: 2015-06-15 | Disposition: A | Discharge status: Home / Self Care | Payer: BLUE CROSS/BLUE SHIELD | Attending: Emergency Medicine | Admitting: Emergency Medicine

## 2015-06-15 ENCOUNTER — Encounter (HOSPITAL_COMMUNITY): Payer: Self-pay

## 2015-06-15 ENCOUNTER — Emergency Department (HOSPITAL_COMMUNITY): Payer: BLUE CROSS/BLUE SHIELD

## 2015-06-15 DIAGNOSIS — Y998 Other external cause status: Secondary | ICD-10-CM | POA: Diagnosis not present

## 2015-06-15 DIAGNOSIS — Z79899 Other long term (current) drug therapy: Secondary | ICD-10-CM | POA: Diagnosis not present

## 2015-06-15 DIAGNOSIS — F329 Major depressive disorder, single episode, unspecified: Secondary | ICD-10-CM | POA: Diagnosis not present

## 2015-06-15 DIAGNOSIS — Y9389 Activity, other specified: Secondary | ICD-10-CM | POA: Diagnosis not present

## 2015-06-15 DIAGNOSIS — S29001A Unspecified injury of muscle and tendon of front wall of thorax, initial encounter: Secondary | ICD-10-CM | POA: Insufficient documentation

## 2015-06-15 DIAGNOSIS — Y92410 Unspecified street and highway as the place of occurrence of the external cause: Secondary | ICD-10-CM | POA: Insufficient documentation

## 2015-06-15 DIAGNOSIS — G43909 Migraine, unspecified, not intractable, without status migrainosus: Secondary | ICD-10-CM | POA: Diagnosis not present

## 2015-06-15 DIAGNOSIS — E669 Obesity, unspecified: Secondary | ICD-10-CM | POA: Insufficient documentation

## 2015-06-15 DIAGNOSIS — S29002A Unspecified injury of muscle and tendon of back wall of thorax, initial encounter: Secondary | ICD-10-CM | POA: Insufficient documentation

## 2015-06-15 DIAGNOSIS — F419 Anxiety disorder, unspecified: Secondary | ICD-10-CM | POA: Insufficient documentation

## 2015-06-15 DIAGNOSIS — Z792 Long term (current) use of antibiotics: Secondary | ICD-10-CM | POA: Insufficient documentation

## 2015-06-15 DIAGNOSIS — S3992XA Unspecified injury of lower back, initial encounter: Secondary | ICD-10-CM | POA: Insufficient documentation

## 2015-06-15 DIAGNOSIS — G8929 Other chronic pain: Secondary | ICD-10-CM | POA: Diagnosis not present

## 2015-06-15 DIAGNOSIS — F17200 Nicotine dependence, unspecified, uncomplicated: Secondary | ICD-10-CM | POA: Insufficient documentation

## 2015-06-15 DIAGNOSIS — M545 Low back pain: Secondary | ICD-10-CM

## 2015-06-15 DIAGNOSIS — R079 Chest pain, unspecified: Secondary | ICD-10-CM
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
## 2015-06-15 MED ORDER — NAPROXEN 500 MG PO TABS: ORAL_TABLET | ORAL | Status: DC

## 2015-06-15 MED ORDER — DIAZEPAM 5 MG/ML IJ SOLN
5.0000 mg | Freq: Once | INTRAMUSCULAR | Status: AC
  Administered 2015-06-15: 5 mg via INTRAMUSCULAR
  Filled 2015-06-15: qty 2

## 2015-06-15 MED ORDER — KETOROLAC TROMETHAMINE 30 MG/ML IJ SOLN
60.0000 mg | Freq: Once | INTRAMUSCULAR | Status: AC
  Administered 2015-06-15: 60 mg via INTRAMUSCULAR
  Filled 2015-06-15: qty 2

## 2015-06-15 MED ORDER — CYCLOBENZAPRINE HCL 10 MG PO TABS: 10.0000 mg | ORAL_TABLET | Freq: Three times a day (TID) | ORAL | Status: DC | PRN
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
## 2015-06-17 MED FILL — AMPHETAMINE SALTS 20 MG TAB: 20 | 30 days supply | Qty: 120 | Fill #0

## 2015-07-17 MED FILL — AMPHETAMINE SALTS 20 MG TAB: 20 | 30 days supply | Qty: 120 | Fill #0

## 2015-11-05 ENCOUNTER — Encounter (HOSPITAL_COMMUNITY): Payer: Self-pay | Admitting: Nurse Practitioner

## 2015-11-05 ENCOUNTER — Emergency Department (HOSPITAL_COMMUNITY): Payer: Self-pay

## 2015-11-05 ENCOUNTER — Emergency Department (HOSPITAL_COMMUNITY)
Admission: EM | Admit: 2015-11-05 | Discharge: 2015-11-05 | Disposition: A | Discharge status: Home / Self Care | Payer: Self-pay | Attending: Emergency Medicine | Admitting: Emergency Medicine

## 2015-11-05 DIAGNOSIS — M7989 Other specified soft tissue disorders: Secondary | ICD-10-CM | POA: Insufficient documentation

## 2015-11-05 DIAGNOSIS — F17200 Nicotine dependence, unspecified, uncomplicated: Secondary | ICD-10-CM | POA: Insufficient documentation

## 2015-11-05 HISTORY — DX: Fibromyalgia: M79.7

## 2015-11-05 LAB — CBC WITH DIFFERENTIAL/PLATELET
BASOS PCT: 1 %
Basophils Absolute: 0.1 10*3/uL (ref 0.0–0.1)
EOS ABS: 0.6 10*3/uL (ref 0.0–0.7)
Eosinophils Relative: 8 %
HEMATOCRIT: 37 % (ref 36.0–46.0)
Hemoglobin: 12.3 g/dL (ref 12.0–15.0)
LYMPHS ABS: 3.3 10*3/uL (ref 0.7–4.0)
Lymphocytes Relative: 46 %
MCH: 29.1 pg (ref 26.0–34.0)
MCHC: 33.2 g/dL (ref 30.0–36.0)
MCV: 87.5 fL (ref 78.0–100.0)
MONO ABS: 0.4 10*3/uL (ref 0.1–1.0)
MONOS PCT: 6 %
NEUTROS ABS: 2.8 10*3/uL (ref 1.7–7.7)
Neutrophils Relative %: 39 %
Platelets: 198 10*3/uL (ref 150–400)
RBC: 4.23 MIL/uL (ref 3.87–5.11)
RDW: 12.1 % (ref 11.5–15.5)
WBC: 7.1 10*3/uL (ref 4.0–10.5)

## 2015-11-05 LAB — COMPREHENSIVE METABOLIC PANEL
ALBUMIN: 3.6 g/dL (ref 3.5–5.0)
ALK PHOS: 40 U/L (ref 38–126)
ALT: 16 U/L (ref 14–54)
ANION GAP: 5 (ref 5–15)
AST: 16 U/L (ref 15–41)
BILIRUBIN TOTAL: 0.5 mg/dL (ref 0.3–1.2)
BUN: 5 mg/dL — ABNORMAL LOW (ref 6–20)
CALCIUM: 8.9 mg/dL (ref 8.9–10.3)
CO2: 26 mmol/L (ref 22–32)
Chloride: 109 mmol/L (ref 101–111)
Creatinine, Ser: 0.96 mg/dL (ref 0.44–1.00)
GLUCOSE: 84 mg/dL (ref 65–99)
POTASSIUM: 4.3 mmol/L (ref 3.5–5.1)
Sodium: 140 mmol/L (ref 135–145)
TOTAL PROTEIN: 5.7 g/dL — AB (ref 6.5–8.1)

## 2015-11-05 LAB — TSH: TSH: 1.707 u[IU]/mL (ref 0.350–4.500)

## 2015-11-05 LAB — D-DIMER, QUANTITATIVE (NOT AT ARMC)

## 2015-11-05 LAB — POC URINE PREG, ED: Preg Test, Ur: NEGATIVE

## 2015-11-05 NOTE — ED Provider Notes (Signed)
CSN: [id number]     Arrival date & time 11/05/15  1705 History   First MD Initiated Contact with Patient 11/05/15 2034     Chief Complaint  Patient presents with  . Leg Swelling     (Consider location/radiation/quality/duration/timing/severity/associated sxs/prior Treatment) HPI Melissa Mendez is a 25 y.o. female with PMH significant for anxiety, depression, migraines, and fibromyalgia who presents with sudden onset, unchanging, constant, moderate lower extremity swelling.  She states she woke up this morning and both of her feet were swollen up just past her ankles.  She states that throughout the day, her left leg has seemed more swollen.  No aggravating or modifying factors.  Associated symptoms include pain.  Denies fever, CP, SOB, DOE, abdominal pain, N/V, numbness, weakness, or color changes.  She has taken ibuprofen with some relief.  Denies injury/trauma.  No hx of DVT/PE.  No OCP or recent immobilization.  No dietary changes.  Patient reports she had  UTI last week (dysuria) and took 4 unknown abx tablets and her symptoms have resolved.  Past Medical History  Diagnosis Date  . Anxiety   . Depression   . Obesity   . Migraines   . Fibromyalgia    Past Surgical History  Procedure Laterality Date  . Tonsillectomy and adenoidectomy  1993  . Wisdom tooth extraction     Family History  Problem Relation Age of Onset  . Hypertension Father    Social History  Substance Use Topics  . Smoking status: Current Every Day Smoker  . Smokeless tobacco: None  . Alcohol Use: No   OB History    No data available     Review of Systems All other systems negative unless otherwise stated in HPI    Allergies  Sulfa antibiotics  Home Medications   Prior to Admission medications   Medication Sig Start Date End Date Taking? Authorizing Provider  ALPRAZolam Duanne Moron) 1 MG tablet Take 1 mg by mouth at bedtime as needed for anxiety.   Yes Historical Provider, MD   amphetamine-dextroamphetamine (ADDERALL) 20 MG tablet Take 20 mg by mouth daily as needed.  11/19/14  Yes Historical Provider, MD  DULoxetine (CYMBALTA) 60 MG capsule TAKE 2 CAPSULES BY MOUTH EVERY DAY. WE ARE GRADUALLY COMING OFF PROZAC 11/07/14  Yes Historical Provider, MD  ibuprofen (ADVIL,MOTRIN) 200 MG tablet Take 800 mg by mouth every 6 (six) hours as needed for mild pain or moderate pain.   Yes Historical Provider, MD  medroxyPROGESTERone (DEPO-PROVERA) 150 MG/ML injection INJECT 1 MILLILITER (150 MG) BY INTRAMUSCULAR ROUTE EVERY 3 MONTHS 05/21/15  Yes Historical Provider, MD  clindamycin (CLEOCIN) 300 MG capsule Take 1 capsule (300 mg total) by mouth 3 (three) times daily. Patient not taking: Reported on 11/05/2015 06/10/15   Tanna Furry, MD  clindamycin (CLEOCIN) 300 MG capsule Take 1 capsule (300 mg total) by mouth 3 (three) times daily. Patient not taking: Reported on 11/05/2015 06/10/15   Tanna Furry, MD  cyclobenzaprine (FLEXERIL) 10 MG tablet Take 1 tablet (10 mg total) by mouth 3 (three) times daily as needed (muscle soreness). Patient not taking: Reported on 11/05/2015 06/15/15   Rolland Porter, MD  HYDROcodone-acetaminophen (NORCO/VICODIN) 5-325 MG tablet Take 2 tablets by mouth every 4 (four) hours as needed. Patient not taking: Reported on 11/05/2015 06/10/15   Tanna Furry, MD  naproxen (NAPROSYN) 500 MG tablet Take 1 po BID with food prn pain Patient not taking: Reported on 11/05/2015 06/15/15   Rolland Porter, MD  oxyCODONE-acetaminophen (PERCOCET/ROXICET) [id number]  MG tablet Take 2 tablets by mouth every 4 (four) hours as needed. Patient not taking: Reported on 11/05/2015 06/10/15   Tanna Furry, MD   BP 130/77 mmHg  Pulse 71  Temp(Src) 98.4 F (36.9 C) (Oral)  Resp 18  SpO2 98%  LMP 10/28/2015 Physical Exam  Constitutional: She is oriented to person, place, and time. She appears well-developed and well-nourished.  Non-toxic appearance. She does not have a sickly appearance. She does not appear ill.   HENT:  Head: Normocephalic and atraumatic.  Mouth/Throat: Oropharynx is clear and moist.  Eyes: Conjunctivae are normal.  Neck: Normal range of motion. Neck supple.  Cardiovascular: Normal rate, regular rhythm, normal heart sounds and intact distal pulses.   Pulses:      Dorsalis pedis pulses are 2+ on the right side, and 2+ on the left side.  Mild-moderate bilateral lower extremity non-pitting edema to mid tibia, L>R.  Capillary refill less than 3 seconds in BLE.   Pulmonary/Chest: Effort normal and breath sounds normal. No accessory muscle usage or stridor. No respiratory distress. She has no wheezes. She has no rhonchi. She has no rales.  Abdominal: Soft. Bowel sounds are normal. She exhibits no distension. There is no tenderness.  Musculoskeletal: Normal range of motion.  Lymphadenopathy:    She has no cervical adenopathy.  Neurological: She is alert and oriented to person, place, and time.  Strength and sensation intact throughout bilateral lower extremities. Gait normal.   Skin: Skin is warm and dry. No rash noted. No erythema.  Psychiatric: She has a normal mood and affect. Her behavior is normal.    ED Course  Procedures (including critical care time) Labs Review Labs Reviewed  COMPREHENSIVE METABOLIC PANEL - Abnormal; Notable for the following:    BUN 5 (*)    Total Protein 5.7 (*)    All other components within normal limits  CBC WITH DIFFERENTIAL/PLATELET  TSH  D-DIMER, QUANTITATIVE (NOT AT [facility])  URINALYSIS, ROUTINE W REFLEX MICROSCOPIC (NOT AT [facility])  POC URINE PREG, ED    Imaging Review Dg Chest 2 View  11/05/2015  CLINICAL DATA:  Lower extremity edema EXAM: CHEST  2 VIEW COMPARISON:  June 15, 2015 FINDINGS: Lungs are clear. Heart size and pulmonary vascularity are normal. No adenopathy. No bone lesions. IMPRESSION: No edema or consolidation. Electronically Signed   By: Lowella Grip III M.D.   On: 11/05/2015 19:53   I have personally reviewed and  evaluated these images and lab results as part of my medical decision-making.   EKG Interpretation None      MDM   Final diagnoses:  Bilateral swelling of feet   Patient presents with bilateral LE edema with L>R.  Neurovascularly intact.  No signs of infection.  No CP, SOB, DOE to suggest PE or CHF.  CXR normal.  DDx include hypothyroid, nephrotic syndrome, cirrhosis, DVT (Low risk Wells' criteria), venous stasis.  Will obtain CBC, CMP, TSH, and d-dimer for further evaluation. Lab work without acute abnormalities.  There does not appear to be an emergent or life threatening cause for the patient symptoms. Recommend plenty of PO hydration and compression stockings.  Follow up PCP.  Return precautions discussed.  Patient agrees and acknowledges the above plan for discharge.      Gloriann Loan, PA-C 11/05/15 2221  Julianne Rice, MD 11/07/15 [phone]

## 2015-11-05 NOTE — ED Notes (Addendum)
Urinalysis and POC urine pregnancy were not collected at 17:25, the pt went in bathroom to collect urine sample but states she can not void at this time, pt has urine cup for sample when she is able

## 2015-11-05 NOTE — ED Notes (Signed)
Pt c/o feet swelling and pain since she woke this morning. Bilateral nonpitting edema noted. No redness. Skin is w/d. She denies cp, sob, abd pain, n/v, fevers, cough. She is alert, breathing easily

## 2015-11-05 NOTE — Discharge Instructions (Signed)
Edema °Edema is an abnormal buildup of fluids in your body tissues. Edema is somewhat dependent on gravity to pull the fluid to the lowest place in your body. That makes the condition more common in the legs and thighs (lower extremities). Painless swelling of the feet and ankles is common and becomes more likely as you get older. It is also common in looser tissues, like around your eyes.  °When the affected area is squeezed, the fluid may move out of that spot and leave a dent for a few moments. This dent is called pitting.  °CAUSES  °There are many possible causes of edema. Eating too much salt and being on your feet or sitting for a long time can cause edema in your legs and ankles. Hot weather may make edema worse. Common medical causes of edema include: °· Heart failure. °· Liver disease. °· Kidney disease. °· Weak blood vessels in your legs. °· Cancer. °· An injury. °· Pregnancy. °· Some medications. °· Obesity.  °SYMPTOMS  °Edema is usually painless. Your skin may look swollen or shiny.  °DIAGNOSIS  °Your health care provider may be able to diagnose edema by asking about your medical history and doing a physical exam. You may need to have tests such as X-rays, an electrocardiogram, or blood tests to check for medical conditions that may cause edema.  °TREATMENT  °Edema treatment depends on the cause. If you have heart, liver, or kidney disease, you need the treatment appropriate for these conditions. General treatment may include: °· Elevation of the affected body part above the level of your heart. °· Compression of the affected body part. Pressure from elastic bandages or support stockings squeezes the tissues and forces fluid back into the blood vessels. This keeps fluid from entering the tissues. °· Restriction of fluid and salt intake. °· Use of a water pill (diuretic). These medications are appropriate only for some types of edema. They pull fluid out of your body and make you urinate more often. This  gets rid of fluid and reduces swelling, but diuretics can have side effects. Only use diuretics as directed by your health care provider. °HOME CARE INSTRUCTIONS  °· Keep the affected body part above the level of your heart when you are lying down.   °· Do not sit still or stand for prolonged periods.   °· Do not put anything directly under your knees when lying down. °· Do not wear constricting clothing or garters on your upper legs.   °· Exercise your legs to work the fluid back into your blood vessels. This may help the swelling go down.   °· Wear elastic bandages or support stockings to reduce ankle swelling as directed by your health care provider.   °· Eat a low-salt diet to reduce fluid if your health care provider recommends it.   °· Only take medicines as directed by your health care provider.  °SEEK MEDICAL CARE IF:  °· Your edema is not responding to treatment. °· You have heart, liver, or kidney disease and notice symptoms of edema. °· You have edema in your legs that does not improve after elevating them.   °· You have sudden and unexplained weight gain. °SEEK IMMEDIATE MEDICAL CARE IF:  °· You develop shortness of breath or chest pain.   °· You cannot breathe when you lie down. °· You develop pain, redness, or warmth in the swollen areas.   °· You have heart, liver, or kidney disease and suddenly get edema. °· You have a fever and your symptoms suddenly get worse. °MAKE SURE YOU:  °·   Understand these instructions. °· Will watch your condition. °· Will get help right away if you are not doing well or get worse. °  °This information is not intended to replace advice given to you by your health care provider. Make sure you discuss any questions you have with your health care provider. °  °Document Released: 04/06/2005 Document Revised: 04/27/2014 Document Reviewed: 01/27/2013 °Elsevier Interactive Patient Education ©2016 Elsevier Inc[employer] ° °

## 2016-02-17 ENCOUNTER — Ambulatory Visit
Admission: EM | Admit: 2016-02-17 | Discharge: 2016-02-17 | Disposition: A | Discharge status: Home / Self Care | Payer: BLUE CROSS/BLUE SHIELD | Attending: Family Medicine | Admitting: Family Medicine

## 2016-02-17 DIAGNOSIS — K120 Recurrent oral aphthae: Secondary | ICD-10-CM

## 2016-02-17 MED ORDER — DOXYCYCLINE HYCLATE 100 MG PO TABS: 100.0000 mg | ORAL_TABLET | Freq: Two times a day (BID) | ORAL | 0 refills | Status: DC

## 2016-02-17 NOTE — ED Triage Notes (Signed)
Pt c/o sore on her upper inside lip. She says it has been raw for the last 4 days and its swollen.

## 2016-02-17 NOTE — ED Provider Notes (Signed)
[facility]    CSN: [id number] Arrival date & time: 02/17/16  1149     History   Chief Complaint Chief Complaint  Patient presents with  . Mouth Lesions    HPI Melissa Mendez is a 25 y.o. female.   25 yo female with a 1 week h/o sore inside the upper lip which has worsened over the last 4 days. Denies any fevers, chills.    The history is provided by the patient.  Mouth Lesions    Past Medical History:  Diagnosis Date  . Anxiety   . Depression   . Fibromyalgia   . Fibromyalgia   . Migraines   . Obesity     Patient Active Problem List   Diagnosis Date Noted  . Fibromyalgia 09/03/2014  . Lumbago 09/03/2014  . Migraines 09/03/2014  . Anxiety and depression 09/03/2014    Past Surgical History:  Procedure Laterality Date  . TONSILLECTOMY AND ADENOIDECTOMY  1993  . WISDOM TOOTH EXTRACTION      OB History    No data available       Home Medications    Prior to Admission medications   Medication Sig Start Date End Date Taking? Authorizing Provider  ibuprofen (ADVIL,MOTRIN) 200 MG tablet Take 800 mg by mouth every 6 (six) hours as needed for mild pain or moderate pain.   Yes Historical Provider, MD  oxyCODONE-acetaminophen (PERCOCET/ROXICET) 5-325 MG tablet Take 2 tablets by mouth every 4 (four) hours as needed. 06/10/15  Yes Tanna Furry, MD  clindamycin (CLEOCIN) 300 MG capsule Take 1 capsule (300 mg total) by mouth 3 (three) times daily. Patient not taking: Reported on 11/05/2015 06/10/15   Tanna Furry, MD  clindamycin (CLEOCIN) 300 MG capsule Take 1 capsule (300 mg total) by mouth 3 (three) times daily. Patient not taking: Reported on 11/05/2015 06/10/15   Tanna Furry, MD  doxycycline (VIBRA-TABS) 100 MG tablet Take 1 tablet (100 mg total) by mouth 2 (two) times daily. 02/17/16   Norval Gable, MD  medroxyPROGESTERone (DEPO-PROVERA) 150 MG/ML injection INJECT 1 MILLILITER (150 MG) BY INTRAMUSCULAR ROUTE EVERY 3 MONTHS 05/21/15   Historical Provider, MD      Family History Family History  Problem Relation Age of Onset  . Hypertension Father     Social History Social History  Substance Use Topics  . Smoking status: Current Every Day Smoker  . Smokeless tobacco: Never Used  . Alcohol use No     Allergies   Sulfa antibiotics   Review of Systems Review of Systems  HENT: Positive for mouth sores.      Physical Exam Triage Vital Signs ED Triage Vitals  Enc Vitals Group     BP 02/17/16 1242 (!) 142/98     Pulse Rate 02/17/16 1242 67     Resp 02/17/16 1242 18     Temp 02/17/16 1242 98.2 F (36.8 C)     Temp Source 02/17/16 1242 Oral     SpO2 02/17/16 1242 100 %     Weight 02/17/16 1242 200 lb (90.7 kg)     Height 02/17/16 1242 5\' 8"  (1.727 m)     Head Circumference --      Peak Flow --      Pain Score 02/17/16 1246 9     Pain Loc --      Pain Edu? --      Excl. in [place]? --    No data found.   Updated Vital Signs BP (!) 142/98 (BP  Location: Left Arm)   Pulse 67   Temp 98.2 F (36.8 C) (Oral)   Resp 18   Ht 5\' 8"  (1.727 m)   Wt 200 lb (90.7 kg)   LMP 01/27/2016   SpO2 100%   BMI 30.41 kg/m   Visual Acuity Right Eye Distance:   Left Eye Distance:   Bilateral Distance:    Right Eye Near:   Left Eye Near:    Bilateral Near:     Physical Exam  Constitutional: She appears well-developed and well-nourished. No distress.  HENT:  Mouth/Throat: Oral lesions (inside upper lip superficial ulceration with slight drainage) present.  Skin: She is not diaphoretic.  Nursing note and vitals reviewed.    UC Treatments / Results  Labs (all labs ordered are listed, but only abnormal results are displayed) Labs Reviewed - No data to display  EKG  EKG Interpretation None       Radiology No results found.  Procedures Procedures (including critical care time)  Medications Ordered in UC Medications - No data to display   Initial Impression / Assessment and Plan / UC Course  I have reviewed the triage  vital signs and the nursing notes.  Pertinent labs & imaging results that were available during my care of the patient were reviewed by me and considered in my medical decision making (see chart for details).  Clinical Course      Final Clinical Impressions(s) / UC Diagnoses   Final diagnoses:  Oral aphthous ulcer   (with secondary infection)   New Prescriptions Discharge Medication List as of 02/17/2016  1:37 PM    START taking these medications   Details  doxycycline (VIBRA-TABS) 100 MG tablet Take 1 tablet (100 mg total) by mouth 2 (two) times daily., Starting Mon 02/17/2016, Normal       1. diagnosis reviewed with patient 2. rx as per orders above; reviewed possible side effects, interactions, risks and benefits  3. Recommend supportive treatment with otc mouthwash  4. Follow-up prn if symptoms worsen or don't improve   Norval Gable, MD 02/17/16 [phone]

## 2016-07-02 ENCOUNTER — Ambulatory Visit (INDEPENDENT_AMBULATORY_CARE_PROVIDER_SITE_OTHER): Payer: Medicaid Other | Admitting: Obstetrics and Gynecology

## 2016-07-02 VITALS — BP 142/77 | HR 90 | Ht 68.0 in | Wt 214.0 lb

## 2016-07-02 DIAGNOSIS — Z3A10 10 weeks gestation of pregnancy: Secondary | ICD-10-CM

## 2016-07-02 NOTE — Progress Notes (Signed)
Melissa Mendez presents for NOB nurse interview visit. Pregnancy confirmation done @ [facility].   G- 1.  P-0 . Pregnancy education material explained and given. ___ cats in the home. NOB labs ordered,. HIV labs and Drug screen were explained optional and she did not decline. Drug screen ordered. PNV encouraged. Genetic screening options discussed. Genetic testing. Pt may discuss with provider. Pt. To follow up with provider in _2_ weeks for NOB physical.  All questions answered.

## 2016-07-03 ENCOUNTER — Ambulatory Visit (INDEPENDENT_AMBULATORY_CARE_PROVIDER_SITE_OTHER): Payer: Medicaid Other

## 2016-07-03 ENCOUNTER — Telehealth: Payer: Self-pay

## 2016-07-03 ENCOUNTER — Ambulatory Visit (INDEPENDENT_AMBULATORY_CARE_PROVIDER_SITE_OTHER): Payer: Medicaid Other | Admitting: Certified Nurse Midwife

## 2016-07-03 ENCOUNTER — Other Ambulatory Visit: Payer: Self-pay | Admitting: Certified Nurse Midwife

## 2016-07-03 ENCOUNTER — Encounter: Payer: Self-pay | Admitting: Certified Nurse Midwife

## 2016-07-03 ENCOUNTER — Ambulatory Visit: Payer: Medicaid Other

## 2016-07-03 VITALS — BP 166/98 | HR 95 | Wt 214.1 lb

## 2016-07-03 DIAGNOSIS — Z3491 Encounter for supervision of normal pregnancy, unspecified, first trimester: Secondary | ICD-10-CM | POA: Diagnosis not present

## 2016-07-03 DIAGNOSIS — O209 Hemorrhage in early pregnancy, unspecified: Secondary | ICD-10-CM

## 2016-07-03 LAB — POCT URINALYSIS DIPSTICK
Bilirubin, UA: NEGATIVE
Glucose, UA: NEGATIVE
Ketones, UA: NEGATIVE
Leukocytes, UA: NEGATIVE
Nitrite, UA: NEGATIVE
PH UA: 5 (ref 5.0–8.0)
PROTEIN UA: NEGATIVE
SPEC GRAV UA: 1.025 (ref 1.030–1.035)
Urobilinogen, UA: NEGATIVE (ref ?–2.0)

## 2016-07-03 LAB — GC/CHLAMYDIA PROBE AMP
CHLAMYDIA, DNA PROBE: NEGATIVE
NEISSERIA GONORRHOEAE BY PCR: NEGATIVE

## 2016-07-03 NOTE — Patient Instructions (Signed)
Threatened Miscarriage A threatened miscarriage is when you have vaginal bleeding during your first 20 weeks of pregnancy but the pregnancy has not ended. Your doctor will do tests to make sure you are still pregnant. The cause of the bleeding may not be known. This condition does not mean your pregnancy will end. It does increase the risk of it ending (complete miscarriage). Follow these instructions at home:  Make sure you keep all your doctor visits for prenatal care.  Get plenty of rest.  Do not have sex or use tampons if you have vaginal bleeding.  Do not douche.  Do not smoke or use drugs.  Do not drink alcohol.  Avoid caffeine. Contact a doctor if:  You have light bleeding from your vagina.  You have belly pain or cramping.  You have a fever. Get help right away if:  You have heavy bleeding from your vagina.  You have clots of blood coming from your vagina.  You have bad pain or cramps in your low back or belly.  You have fever, chills, and bad belly pain. This information is not intended to replace advice given to you by your health care provider. Make sure you discuss any questions you have with your health care provider. Document Released: 03/19/2008 Document Revised: 09/12/2015 Document Reviewed: 01/31/2013 Elsevier Interactive Patient Education  2017 [employer].

## 2016-07-03 NOTE — Progress Notes (Signed)
Pt is here with c/o vag bleeding that is becoming more red in color. Has some slight cramping.

## 2016-07-03 NOTE — Progress Notes (Signed)
Subjective:   Melissa Mendez is a 26 y.o. G1P0 [redacted]w[redacted]d being seen today for work in probleml visit.  Patient reports vaginal bleeding since 07/01/2016.   On 07/01/2016, she noticed brown spotting with stringy clots when wiping. A Beta HCG level was added to her New OB lab work.   This morning when she went to the bathroom the bleeding from her vagina "turned the water red". Since that episode she reports darkish red blood with wiping and " a little cramping". There is no blood on her pad or in her underwear.   She endorses breast tenderness, but denies nausea, vomiting, diarrhea, and constipation.   [place] was seen yesterday for her New OB intake. Her pregnancy was confirmed by [facility]. She previously was on Depo for pregnancy prevention and is uncertain of her LMP.   Denies difficulty breathing or respiratory distress, chest pain, abdominal pain, and leg pain or swelling.   The following portions of the patient's history were reviewed and updated as appropriate: allergies, current medications, past family history, past medical history, past social history, past surgical history and problem list.   Objective:  BP (!) 166/98   Pulse 95   Wt 214 lb 1 oz (97.1 kg)   LMP 04/20/2016   BMI 32.55 kg/m    Alert and oriented x 4   FHT:  Not present   Uterine Size:  Unable to palpate    Abdomen:  soft, round,non-tender  Vaginal:  Discharge, bloody    Cervix: Visually closed   Results for orders placed or performed in visit on 07/03/16 (from the past 24 hour(s))  POCT urinalysis dipstick     Status: None   Collection Time: 07/03/16 11:15 AM  Result Value Ref Range   Color, UA reddish    Clarity, UA dark    Glucose, UA neg    Bilirubin, UA neg    Ketones, UA neg    Spec Grav, UA 1.025 1.030 - 1.035   Blood, UA large    pH, UA 5.0 5.0 - 8.0   Protein, UA neg    Urobilinogen, UA negative Negative - 2.0   Nitrite, UA neg    Leukocytes, UA Negative Negative   ULTRASOUND  REPORT  Location: [facility] Date of Service: 07/03/16  Indications: Dating and Viability for unknown LMP and vaginal bleeding Findings:  No fetal pole or obvious gestational sac is seen at this time. Beta HCG drawn yesterday was 3,957. Uterus appears WNL. Thickened endometrium. Dark red blood on EV probe at end of exam.  FHR: Not visualized.   Right Ovary measures 2.9 x 2.4 x 2.1 cm, and appears WNL. Left Ovary is only seen transabdominally and measures 4.5 x 1.6 x 2.2 cm. There is a cpl cyst noted. There is evidence of a corpus luteal cyst in the left ovary. Survey of the adnexa demonstrates no adnexal masses. There is no free peritoneal fluid in the cul de sac.  Impression: 1. No gestational sac or fetal pole is seen at this time. Patient was on Depo and is unsure of her last menstrual cycle. Beta HcG yesterday was 3,957. Both adnexa were visualized and surveyed and appear WNL. Both ovaries were seen and other than a probable left corpus luteal cyst, were normal. No obvious evidence of an ectopic pregnancy.  Assessment:   1. Prenatal care in first trimester  - POCT urinalysis dipstick - Beta HCG, Quant - US OB Comp Less 14 Wks; Future - US OB Transvaginal;  Future  2. Bleeding in early pregnancy - Beta HCG, Quant - US OB Comp Less 14 Wks; Future - US OB Transvaginal; Future - Beta HCG, Quant; Future  Plan:   Ultrasound for dating and viability (completed, results above)  Ultrasound findings reviewed with pt and condolences offered  Reviewed red flag symptoms and when to call  Repeat Beta HCG on Monday   Diona Fanti, CNM  A total of 30 minutes were spent face-to-face with the patient during the encounter with greater than 50% dealing with counseling and coordination of care.

## 2016-07-03 NOTE — Telephone Encounter (Signed)
Pt called stating that she was seen for her NOB intake yesterday. LMP: 04/20/2016 (unsure), EGA: 10.4 wks., pt states she had some dark discharge/bleeding yesterday but this am after going to [facility] pt noted commode filled with blood. She is having some "less than" period like cramping.  Pt appt today at 10:45am to see provider. Unable to come earlier.

## 2016-07-04 LAB — CBC WITH DIFFERENTIAL/PLATELET
BASOS: 1 %
Basophils Absolute: 0.1 10*3/uL (ref 0.0–0.2)
EOS (ABSOLUTE): 0.3 10*3/uL (ref 0.0–0.4)
EOS: 6 %
HEMATOCRIT: 41.5 % (ref 34.0–46.6)
HEMOGLOBIN: 13.8 g/dL (ref 11.1–15.9)
IMMATURE GRANS (ABS): 0 10*3/uL (ref 0.0–0.1)
Immature Granulocytes: 0 %
Lymphocytes Absolute: 2.1 10*3/uL (ref 0.7–3.1)
Lymphs: 36 %
MCH: 28.6 pg (ref 26.6–33.0)
MCHC: 33.3 g/dL (ref 31.5–35.7)
MCV: 86 fL (ref 79–97)
MONOCYTES: 5 %
Monocytes Absolute: 0.3 10*3/uL (ref 0.1–0.9)
NEUTROS ABS: 3 10*3/uL (ref 1.4–7.0)
Neutrophils: 52 %
Platelets: 272 10*3/uL (ref 150–379)
RBC: 4.82 x10E6/uL (ref 3.77–5.28)
RDW: 12.7 % (ref 12.3–15.4)
WBC: 5.7 10*3/uL (ref 3.4–10.8)

## 2016-07-04 LAB — ANTIBODY SCREEN: Antibody Screen: NEGATIVE

## 2016-07-04 LAB — ABO AND RH: RH TYPE: POSITIVE

## 2016-07-04 LAB — HIV ANTIBODY (ROUTINE TESTING W REFLEX): HIV SCREEN 4TH GENERATION: NONREACTIVE

## 2016-07-04 LAB — VARICELLA ZOSTER ANTIBODY, IGG: Varicella zoster IgG: 1040 index (ref >165)

## 2016-07-04 LAB — URINE CULTURE

## 2016-07-04 LAB — BETA HCG QUANT (REF LAB): hCG Quant: 3957 m[IU]/mL

## 2016-07-04 LAB — RPR: RPR: NONREACTIVE

## 2016-07-04 LAB — HEPATITIS B SURFACE ANTIGEN: Hepatitis B Surface Ag: NEGATIVE

## 2016-07-04 LAB — RUBELLA SCREEN: RUBELLA: 1.81 {index} (ref >0.99)

## 2016-07-09 ENCOUNTER — Other Ambulatory Visit: Payer: Self-pay | Admitting: Obstetrics and Gynecology

## 2016-07-09 DIAGNOSIS — O99320 Drug use complicating pregnancy, unspecified trimester: Principal | ICD-10-CM

## 2016-07-09 DIAGNOSIS — F1910 Other psychoactive substance abuse, uncomplicated: Secondary | ICD-10-CM | POA: Insufficient documentation

## 2016-07-09 LAB — MICROSCOPIC EXAMINATION: CASTS: NONE SEEN /LPF

## 2016-07-09 LAB — MONITOR DRUG PROFILE 14(MW)
Amphetamine Scrn, Ur: NEGATIVE ng/mL
BARBITURATE SCREEN URINE: NEGATIVE ng/mL
BENZODIAZEPINE SCREEN, URINE: NEGATIVE ng/mL
CREATININE(CRT), U: 263.3 mg/dL (ref 20.0–300.0)
Cocaine (Metab) Scrn, Ur: NEGATIVE ng/mL
Fentanyl, Urine: NEGATIVE pg/mL
MEPERIDINE SCREEN, URINE: NEGATIVE ng/mL
METHADONE SCREEN, URINE: NEGATIVE ng/mL
Ph of Urine: 5.8 (ref 4.5–8.9)
Phencyclidine Qn, Ur: NEGATIVE ng/mL
Propoxyphene Scrn, Ur: NEGATIVE ng/mL
SPECIFIC GRAVITY: 1.018
Tramadol Screen, Urine: NEGATIVE ng/mL

## 2016-07-09 LAB — URINALYSIS, ROUTINE W REFLEX MICROSCOPIC
Bilirubin, UA: NEGATIVE
GLUCOSE, UA: NEGATIVE
KETONES UA: NEGATIVE
Leukocytes, UA: NEGATIVE
NITRITE UA: NEGATIVE
Protein, UA: NEGATIVE
SPEC GRAV UA: 1.025 (ref 1.005–1.030)
UUROB: 0.2 mg/dL (ref 0.2–1.0)
pH, UA: 6 (ref 5.0–7.5)

## 2016-07-09 LAB — CANNABINOID (GC/MS), URINE: CANNABINOID UR: POSITIVE — AB

## 2016-07-09 LAB — OXYCODONE/OXYMORPHONE CONFIRM
OXYCODONE/OXYMORPH: POSITIVE — AB
OXYCODONE: 3490 ng/mL
OXYCODONE: POSITIVE — AB
OXYMORPHONE CONFIRM: 3470 ng/mL
OXYMORPHONE: POSITIVE — AB

## 2016-07-09 LAB — OPIATES CONFIRMATION, URINE: Opiates: NEGATIVE ng/mL

## 2016-07-09 LAB — BUPRENORPHINE CONFIRM, URINE
BUPRENORPHINE: POSITIVE — AB
Buprenorphine Confirm: 20 ng/mL
Buprenorphine: POSITIVE — AB
NORBUPRENORPHINE CONFIRM: 44 ng/mL
NORBUPRENORPHINE: POSITIVE — AB

## 2016-07-21 ENCOUNTER — Encounter: Payer: Self-pay | Admitting: Obstetrics and Gynecology

## 2016-08-11 MED FILL — SUBOXONE 8 MG-2 MG SL FILM: 8-2 | 7 days supply | Qty: 14 | Fill #0

## 2016-09-01 MED FILL — SUBOXONE 8 MG-2 MG SL FILM: 8-2 | 14 days supply | Qty: 35 | Fill #0

## 2016-09-15 MED FILL — SUBOXONE 8 MG-2 MG SL FILM: 8-2 | 7 days supply | Qty: 21 | Fill #0

## 2016-09-22 MED FILL — SUBOXONE 8 MG-2 MG SL FILM: 8-2 | 7 days supply | Qty: 21 | Fill #0

## 2016-09-29 MED FILL — SUBOXONE 8 MG-2 MG SL FILM: 8-2 | 14 days supply | Qty: 42 | Fill #0

## 2016-10-13 MED FILL — SUBOXONE 8 MG-2 MG SL FILM: 8-2 | 28 days supply | Qty: 84 | Fill #0

## 2016-11-10 MED FILL — SUBOXONE 8 MG-2 MG SL FILM: 8-2 | 14 days supply | Qty: 42 | Fill #0

## 2016-11-24 MED FILL — SUBOXONE 8 MG-2 MG SL FILM: 8-2 | 21 days supply | Qty: 63 | Fill #0

## 2017-04-19 ENCOUNTER — Encounter (HOSPITAL_COMMUNITY): Payer: Self-pay

## 2018-12-05 ENCOUNTER — Ambulatory Visit: Payer: Self-pay | Admitting: Maternal Newborn

## 2018-12-15 ENCOUNTER — Encounter: Payer: Self-pay | Admitting: Maternal Newborn

## 2018-12-15 ENCOUNTER — Ambulatory Visit (INDEPENDENT_AMBULATORY_CARE_PROVIDER_SITE_OTHER): Payer: Medicaid Other | Admitting: Maternal Newborn

## 2018-12-15 ENCOUNTER — Other Ambulatory Visit: Payer: Self-pay

## 2018-12-15 ENCOUNTER — Other Ambulatory Visit (HOSPITAL_COMMUNITY)
Admission: RE | Admit: 2018-12-15 | Discharge: 2018-12-15 | Disposition: A | Discharge status: Home / Self Care | Payer: Medicaid Other | Source: Ambulatory Visit | Attending: Maternal Newborn | Admitting: Maternal Newborn

## 2018-12-15 VITALS — BP 130/78 | HR 78 | Ht 68.0 in | Wt 210.8 lb

## 2018-12-15 DIAGNOSIS — Z1151 Encounter for screening for human papillomavirus (HPV): Secondary | ICD-10-CM | POA: Insufficient documentation

## 2018-12-15 DIAGNOSIS — R87610 Atypical squamous cells of undetermined significance on cytologic smear of cervix (ASC-US): Secondary | ICD-10-CM | POA: Insufficient documentation

## 2018-12-15 DIAGNOSIS — R8290 Unspecified abnormal findings in urine: Secondary | ICD-10-CM | POA: Diagnosis not present

## 2018-12-15 DIAGNOSIS — Z01419 Encounter for gynecological examination (general) (routine) without abnormal findings: Secondary | ICD-10-CM | POA: Diagnosis not present

## 2018-12-15 DIAGNOSIS — Z113 Encounter for screening for infections with a predominantly sexual mode of transmission: Secondary | ICD-10-CM | POA: Insufficient documentation

## 2018-12-15 DIAGNOSIS — Z716 Tobacco abuse counseling: Secondary | ICD-10-CM

## 2018-12-15 DIAGNOSIS — Z124 Encounter for screening for malignant neoplasm of cervix: Secondary | ICD-10-CM

## 2018-12-15 DIAGNOSIS — F17209 Nicotine dependence, unspecified, with unspecified nicotine-induced disorders: Secondary | ICD-10-CM

## 2018-12-15 LAB — POCT URINALYSIS DIPSTICK
Bilirubin, UA: NEGATIVE
Blood, UA: NEGATIVE
Glucose, UA: NEGATIVE
Leukocytes, UA: NEGATIVE
Nitrite, UA: POSITIVE
Protein, UA: POSITIVE — AB
Spec Grav, UA: 1.02 (ref 1.010–1.025)
Urobilinogen, UA: NEGATIVE E.U./dL — AB
pH, UA: 6 (ref 5.0–8.0)

## 2018-12-15 MED ORDER — CHANTIX STARTING MONTH PAK 0.5 MG X 11 & 1 MG X 42 PO TABS: ORAL_TABLET | ORAL | 0 refills | Status: DC

## 2018-12-15 NOTE — Patient Instructions (Signed)
Therapists/Counselors/Psychologists   Karen [place], [place]    [phone]        [place], [place] [address]        Peggye Ley, CSW [id number] [place], [place] [address]  Lady Deutscher, [place]        [phone]        [id number]      [place], [place] [address]        Bettey Mare, [place] [phone] [address] [place], [place] [address]   Dimas Aguas, LMFT       [phone]        [address] [place], [place] [address]        Miguel Dibble [phone] [address] [place], [place] [address]  Rhona Raider        [phone]        [address]       [place], [place] [address]        Sena Hitch [phone] [address] [place], [place] [address]  Lestine Box, [place]       [phone]        [address]       [place], [place] [address]        Marjie Skiff, [place] [phone] [address]  [place], [place] [address]   Einar Gip        [phone]        [address] [place], [place] [address]         [place] [phone] [email or web address]   Sation Konchella       [phone]        [address] [place], [place] [address]        [place] [phone] [email or web address]   ADD/ADHD Testing:   Haig Prophet, PhD [address] [place], [place] [address] [phone] [facility] in [place], [place], [place], [place] [address] Phone: [phone] Fax: [phone] [place] , [place], Medicaid   [employer] [place], [place] [address] [phone]    [place] [facility] in [place], [place]   ----------------------------------------------------------   Neuropsych Testing Dr Maryellen Pile [address] [place], [place]: [phone] F: [phone] [email or web address]    [facility]  [place] Therapist: [phone]     Eating Disorders   [facility]  [email or web address] [phone]  [facility] [phone], ext [phone]   Substance Abuse Treatment   [facility] [place], [place], [place], [place], [place], [place], [place], [place], [place], [place] Program:  [email or web address]

## 2018-12-15 NOTE — Progress Notes (Signed)
Gynecology Annual Exam  PCP: Pleas Koch, NP  Chief Complaint:  Chief Complaint  Patient presents with  . Gynecologic Exam    trying to conceive for 2 1/92yrs; random bruises; chantix rx; pee smells terrible; fibromyalgia - neurotins or ref; ref for derm.    History of Present Illness: Patient is a 28 y.o. G1P0 presenting for an annual exam.  LMP: Patient's last menstrual period was 11/28/2018 (approximate). Average Interval: regular, 30 days Duration of flow: several days Heavy Menses: no Clots: no Intermenstrual Bleeding: no Postcoital Bleeding: no Dysmenorrhea: mild cramping  The patient is sexually active. She currently uses none for contraception. She does not have dyspareunia.  The patient does perform self breast exams.  There is no notable family history of breast or ovarian cancer in her family.  The patient wears seatbelts: yes.   The patient has regular exercise: no.    The patient reports current symptoms of depression.  She states that this is related to her inability to conceive. She would like to a receive a list of therapists.  She reports that she has fibromyalgia and would like to start Neurontin again to help her symptoms.  Reports frequent bad odor of urine, especially at first morning void. Drinks a lot of coffee/tea and not much water.  She would like to quit smoking and requests a prescription for Chantix.  Domestic violence screening is negative.  Review of Systems  Constitutional: Negative.   HENT: Negative.   Eyes: Negative.   Respiratory: Negative for cough, shortness of breath and wheezing.   Cardiovascular: Negative for chest pain and palpitations.  Gastrointestinal: Negative for abdominal pain, constipation and diarrhea.  Genitourinary: Negative for dysuria and flank pain.       Frequent malodorous urine  Musculoskeletal: Positive for joint pain and myalgias.  Skin: Negative.   Neurological: Negative.   Endo/Heme/Allergies:  Bruises/bleeds easily.  Psychiatric/Behavioral: Positive for depression. The patient is nervous/anxious.   All other systems reviewed and are negative.   Past Medical History:  Past Medical History:  Diagnosis Date  . Anxiety   . Depression   . Fibromyalgia   . Fibromyalgia   . Migraines   . Obesity     Past Surgical History:  Past Surgical History:  Procedure Laterality Date  . TONSILLECTOMY AND ADENOIDECTOMY  1993  . WISDOM TOOTH EXTRACTION      Gynecologic History:  Patient's last menstrual period was 11/28/2018 (approximate). Contraception: none Last Pap: Several years ago. Results were: Normal per patient   Obstetric History: G1P0  Family History:  Family History  Problem Relation Age of Onset  . Hypertension Father     Social History:  Social History   Socioeconomic History  . Marital status: Single    Spouse name: Not on file  . Number of children: Not on file  . Years of education: Not on file  . Highest education level: Not on file  Occupational History  . Not on file  Social Needs  . Financial resource strain: Not on file  . Food insecurity    Worry: Not on file    Inability: Not on file  . Transportation needs    Medical: Not on file    Non-medical: Not on file  Tobacco Use  . Smoking status: Current Every Day Smoker  . Smokeless tobacco: Never Used  Substance and Sexual Activity  . Alcohol use: No    Alcohol/week: 0.0 standard drinks  . Drug use: No  .  Sexual activity: Yes    Birth control/protection: None  Lifestyle  . Physical activity    Days per week: Not on file    Minutes per session: Not on file  . Stress: Not on file  Relationships  . Social Herbalist on phone: Not on file    Gets together: Not on file    Attends religious service: Not on file    Active member of club or organization: Not on file    Attends meetings of clubs or organizations: Not on file    Relationship status: Not on file  . Intimate partner  violence    Fear of current or ex partner: Not on file    Emotionally abused: Not on file    Physically abused: Not on file    Forced sexual activity: Not on file  Other Topics Concern  . Not on file  Social History Narrative   Single.   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   Enjoys painting, being with her friends and boyfriend.       Allergies:  Allergies  Allergen Reactions  . Sulfa Antibiotics Other (See Comments)    Was told as a child that she is allergic to sulfa drugs; reaction unknown    Medications: Prior to Admission medications   Medication Sig Start Date End Date Taking? Authorizing Provider  Prenatal Vit-Fe Fumarate-FA (PRENATAL MULTIVITAMIN) TABS tablet Take 1 tablet by mouth daily at 12 noon.    [provider]    Physical Exam Vitals: Blood pressure 130/78, pulse 78, height 5\' 8"  (1.727 m), weight 210 lb 12.8 oz (95.6 kg), last menstrual period 11/28/2018, not currently breastfeeding.  General: NAD HEENT: normocephalic, anicteric Thyroid: no enlargement Pulmonary: No increased work of breathing, CTAB Cardiovascular: RRR, no murmurs, rubs, or gallops Breast: Breasts symmetrical, no tenderness, no palpable nodules or masses, no skin or nipple retraction present, no nipple discharge.  No axillary or supraclavicular lymphadenopathy. Abdomen: Soft, non-tender, non-distended.  Umbilicus without lesions.  No hepatomegaly, splenomegaly or masses palpable. No evidence of hernia  Genitourinary:  External: Normal external female genitalia.  Normal urethral  meatus, normal Bartholin's and Skene's glands.    Vagina: Normal vaginal mucosa, no evidence of prolapse.    Cervix: Grossly normal in appearance, no bleeding  Uterus: Non-enlarged, mobile, normal contour.  No CMT  Adnexa: ovaries non-enlarged, no adnexal masses  Rectal: deferred  Lymphatic: no evidence of inguinal lymphadenopathy Extremities: no edema, erythema, or  tenderness Neurologic: Grossly intact Psychiatric: mood appropriate, affect full  Assessment: 28 y.o. G1P0 annual exam  Plan: Problem List Items Addressed This Visit    None    Visit Diagnoses    Bad odor of urine    -  Primary   Relevant Orders   POCT urinalysis dipstick (Completed)   Women's annual routine gynecological examination       Abnormal finding on urinalysis       Relevant Orders   Urine Culture   Pap smear for cervical cancer screening       Relevant Orders   Cytology - PAP   Routine screening for STI (sexually transmitted infection)       Relevant Orders   Cytology - PAP   Encounter for smoking cessation counseling       Relevant Medications   varenicline (CHANTIX STARTING MONTH PAK) 0.5 MG X 11 & 1 MG X 42 tablet      1) STI  screening was offered and accepted  2) ASCCP guidelines and rationale discussed.  Patient opts for every 3 year screening interval  3) Contraception - She is trying to conceive. Had a pregnancy two years ago that ended in an early miscarriage. She asked about infertility workup/testing; discussed having partner go to his provider first and she will schedule a visit as needed following results  4) Routine healthcare maintenance including cholesterol, diabetes screening discussed: will have done at PCP  5) Advised seeing PCP for fibromyalgia symptoms/medication.  6) Encouraged smoking cessation. Rx sent for Chantix, refill as needed.  7) Nitrites on UA; urine culture sent. Advised increasing hydration with water.  8) List of therapists given, does not have current coverage under insurance but encouraged her to call to see if provider will accept self-pay with discount.  9) Follow up 1 year for an annual exam.  Avel Sensor, CNM 12/16/2018  3:25 PM

## 2018-12-17 LAB — URINE CULTURE

## 2018-12-21 ENCOUNTER — Other Ambulatory Visit: Payer: Self-pay | Admitting: Maternal Newborn

## 2018-12-21 DIAGNOSIS — N390 Urinary tract infection, site not specified: Secondary | ICD-10-CM

## 2018-12-21 LAB — CYTOLOGY - PAP
Chlamydia: NEGATIVE
Diagnosis: UNDETERMINED — AB
HPV: NOT DETECTED
Neisseria Gonorrhea: NEGATIVE
Trichomonas: NEGATIVE

## 2018-12-21 MED ORDER — NITROFURANTOIN MONOHYD MACRO 100 MG PO CAPS: 100.0000 mg | ORAL_CAPSULE | Freq: Two times a day (BID) | ORAL | 0 refills | Status: DC

## 2018-12-21 NOTE — Progress Notes (Signed)
Rx for Macrobid sent to [facility] in [place].

## 2019-03-03 ENCOUNTER — Other Ambulatory Visit (HOSPITAL_COMMUNITY)
Admission: RE | Admit: 2019-03-03 | Discharge: 2019-03-03 | Disposition: A | Discharge status: Home / Self Care | Payer: Medicaid Other | Source: Ambulatory Visit | Attending: Obstetrics & Gynecology | Admitting: Obstetrics & Gynecology

## 2019-03-03 ENCOUNTER — Other Ambulatory Visit: Payer: Medicaid Other

## 2019-03-03 ENCOUNTER — Ambulatory Visit (INDEPENDENT_AMBULATORY_CARE_PROVIDER_SITE_OTHER): Payer: Medicaid Other | Admitting: Obstetrics & Gynecology

## 2019-03-03 ENCOUNTER — Other Ambulatory Visit: Payer: Self-pay

## 2019-03-03 ENCOUNTER — Encounter: Payer: Self-pay | Admitting: Obstetrics & Gynecology

## 2019-03-03 ENCOUNTER — Encounter

## 2019-03-03 VITALS — BP 138/80 | Wt 198.0 lb

## 2019-03-03 DIAGNOSIS — Z3201 Encounter for pregnancy test, result positive: Secondary | ICD-10-CM

## 2019-03-03 DIAGNOSIS — Z113 Encounter for screening for infections with a predominantly sexual mode of transmission: Secondary | ICD-10-CM

## 2019-03-03 DIAGNOSIS — Z3481 Encounter for supervision of other normal pregnancy, first trimester: Secondary | ICD-10-CM

## 2019-03-03 DIAGNOSIS — N926 Irregular menstruation, unspecified: Secondary | ICD-10-CM

## 2019-03-03 DIAGNOSIS — Z3A08 8 weeks gestation of pregnancy: Secondary | ICD-10-CM

## 2019-03-03 LAB — POCT URINALYSIS DIPSTICK OB
Glucose, UA: NEGATIVE
POC,PROTEIN,UA: NEGATIVE

## 2019-03-03 LAB — POCT URINE PREGNANCY: Preg Test, Ur: POSITIVE — AB

## 2019-03-03 NOTE — Patient Instructions (Signed)
Commonly Asked Questions During Pregnancy  Cats: A parasite can be excreted in cat feces.  To avoid exposure you need to have another person empty the little box.  If you must empty the litter box you will need to wear gloves.  Wash your hands after handling your cat.  This parasite can also be found in raw or undercooked meat so this should also be avoided.  Colds, Sore Throats, Flu: Please check your medication sheet to see what you can take for symptoms.  If your symptoms are unrelieved by these medications please call the office.  Dental Work: Most any dental work your dentist recommends is permitted.  X-rays should only be taken during the first trimester if absolutely necessary.  Your abdomen should be shielded with a lead apron during all x-rays.  Please notify your provider prior to receiving any x-rays.  Novocaine is fine; gas is not recommended.  If your dentist requires a note from us prior to dental work please call the office and we will provide one for you.  Exercise: Exercise is an important part of staying healthy during your pregnancy.  You may continue most exercises you were accustomed to prior to pregnancy.  Later in your pregnancy you will most likely notice you have difficulty with activities requiring balance like riding a bicycle.  It is important that you listen to your body and avoid activities that put you at a higher risk of falling.  Adequate rest and staying well hydrated are a must!  If you have questions about the safety of specific activities ask your provider.    Exposure to Children with illness: Try to avoid obvious exposure; report any symptoms to us when noted,  If you have chicken pos, red measles or mumps, you should be immune to these diseases.   Please do not take any vaccines while pregnant unless you have checked with your OB provider.  Fetal Movement: After 28 weeks we recommend you do "kick counts" twice daily.  Lie or sit down in a calm quiet environment and  count your baby movements "kicks".  You should feel your baby at least 10 times per hour.  If you have not felt 10 kicks within the first hour get up, walk around and have something sweet to eat or drink then repeat for an additional hour.  If count remains less than 10 per hour notify your provider.  Fumigating: Follow your pest control agent's advice as to how long to stay out of your home.  Ventilate the area well before re-entering.  Hemorrhoids:   Most over-the-counter preparations can be used during pregnancy.  Check your medication to see what is safe to use.  It is important to use a stool softener or fiber in your diet and to drink lots of liquids.  If hemorrhoids seem to be getting worse please call the office.   Hot Tubs:  Hot tubs Jacuzzis and saunas are not recommended while pregnant.  These increase your internal body temperature and should be avoided.  Intercourse:  Sexual intercourse is safe during pregnancy as long as you are comfortable, unless otherwise advised by your provider.  Spotting may occur after intercourse; report any bright red bleeding that is heavier than spotting.  Labor:  If you know that you are in labor, please go to the hospital.  If you are unsure, please call the office and let us help you decide what to do.  Lifting, straining, etc:  If your job requires heavy   lifting or straining please check with your provider for any limitations.  Generally, you should not lift items heavier than that you can lift simply with your hands and arms (no back muscles)  Painting:  Paint fumes do not harm your pregnancy, but may make you ill and should be avoided if possible.  Latex or water based paints have less odor than oils.  Use adequate ventilation while painting.  Permanents & Hair Color:  Chemicals in hair dyes are not recommended as they cause increase hair dryness which can increase hair loss during pregnancy.  " Highlighting" and permanents are allowed.  Dye may be  absorbed differently and permanents may not hold as well during pregnancy.  Sunbathing:  Use a sunscreen, as skin burns easily during pregnancy.  Drink plenty of fluids; avoid over heating.  Tanning Beds:  Because their possible side effects are still unknown, tanning beds are not recommended.  Ultrasound Scans:  Routine ultrasounds are performed at approximately 20 weeks.  You will be able to see your baby's general anatomy an if you would like to know the gender this can usually be determined as well.  If it is questionable when you conceived you may also receive an ultrasound early in your pregnancy for dating purposes.  Otherwise ultrasound exams are not routinely performed unless there is a medical necessity.  Although you can request a scan we ask that you pay for it when conducted because insurance does not cover " patient request" scans.  Work: If your pregnancy proceeds without complications you may work until your due date, unless your physician or employer advises otherwise.  Round Ligament Pain/Pelvic Discomfort:  Sharp, shooting pains not associated with bleeding are fairly common, usually occurring in the second trimester of pregnancy.  They tend to be worse when standing up or when you remain standing for long periods of time.  These are the result of pressure of certain pelvic ligaments called "round ligaments".  Rest, Tylenol and heat seem to be the most effective relief.  As the womb and fetus grow, they rise out of the pelvis and the discomfort improves.  Please notify the office if your pain seems different than that described.  It may represent a more serious condition.  First Trimester of Pregnancy The first trimester of pregnancy is from week 1 until the end of week 13 (months 1 through 3). A week after a sperm fertilizes an egg, the egg will implant on the wall of the uterus. This embryo will begin to develop into a baby. Genes from you and your partner will form the baby. The  female genes will determine whether the baby will be a boy or a girl. At 6-8 weeks, the eyes and face will be formed, and the heartbeat can be seen on ultrasound. At the end of 12 weeks, all the baby's organs will be formed. Now that you are pregnant, you will want to do everything you can to have a healthy baby. Two of the most important things are to get good prenatal care and to follow your health care provider's instructions. Prenatal care is all the medical care you receive before the baby's birth. This care will help prevent, find, and treat any problems during the pregnancy and childbirth. Body changes during your first trimester Your body goes through many changes during pregnancy. The changes vary from woman to woman.  You may gain or lose a couple of pounds at first.  You may feel sick to your stomach (  nauseous) and you may throw up (vomit). If the vomiting is uncontrollable, call your health care provider.  You may tire easily.  You may develop headaches that can be relieved by medicines. All medicines should be approved by your health care provider.  You may urinate more often. Painful urination may mean you have a bladder infection.  You may develop heartburn as a result of your pregnancy.  You may develop constipation because certain hormones are causing the muscles that push stool through your intestines to slow down.  You may develop hemorrhoids or swollen veins (varicose veins).  Your breasts may begin to grow larger and become tender. Your nipples may stick out more, and the tissue that surrounds them (areola) may become darker.  Your gums may bleed and may be sensitive to brushing and flossing.  Dark spots or blotches (chloasma, mask of pregnancy) may develop on your face. This will likely fade after the baby is born.  Your menstrual periods will stop.  You may have a loss of appetite.  You may develop cravings for certain kinds of food.  You may have changes in your  emotions from day to day, such as being excited to be pregnant or being concerned that something may go wrong with the pregnancy and baby.  You may have more vivid and strange dreams.  You may have changes in your hair. These can include thickening of your hair, rapid growth, and changes in texture. Some women also have hair loss during or after pregnancy, or hair that feels dry or thin. Your hair will most likely return to normal after your baby is born. What to expect at prenatal visits During a routine prenatal visit:  You will be weighed to make sure you and the baby are growing normally.  Your blood pressure will be taken.  Your abdomen will be measured to track your baby's growth.  The fetal heartbeat will be listened to between weeks 10 and 14 of your pregnancy.  Test results from any previous visits will be discussed. Your health care provider may ask you:  How you are feeling.  If you are feeling the baby move.  If you have had any abnormal symptoms, such as leaking fluid, bleeding, severe headaches, or abdominal cramping.  If you are using any tobacco products, including cigarettes, chewing tobacco, and electronic cigarettes.  If you have any questions. Other tests that may be performed during your first trimester include:  Blood tests to find your blood type and to check for the presence of any previous infections. The tests will also be used to check for low iron levels (anemia) and protein on red blood cells (Rh antibodies). Depending on your risk factors, or if you previously had diabetes during pregnancy, you may have tests to check for high blood sugar that affects pregnant women (gestational diabetes).  Urine tests to check for infections, diabetes, or protein in the urine.  An ultrasound to confirm the proper growth and development of the baby.  Fetal screens for spinal cord problems (spina bifida) and Down syndrome.  HIV (human immunodeficiency virus) testing.  Routine prenatal testing includes screening for HIV, unless you choose not to have this test.  You may need other tests to make sure you and the baby are doing well. Follow these instructions at home: Medicines  Follow your health care provider's instructions regarding medicine use. Specific medicines may be either safe or unsafe to take during pregnancy.  Take a prenatal vitamin that contains at  least 600 micrograms (mcg) of folic acid.  If you develop constipation, try taking a stool softener if your health care provider approves. Eating and drinking   Eat a balanced diet that includes fresh fruits and vegetables, whole grains, good sources of protein such as meat, eggs, or tofu, and low-fat dairy. Your health care provider will help you determine the amount of weight gain that is right for you.  Avoid raw meat and uncooked cheese. These carry germs that can cause birth defects in the baby.  Eating four or five small meals rather than three large meals a day may help relieve nausea and vomiting. If you start to feel nauseous, eating a few soda crackers can be helpful. Drinking liquids between meals, instead of during meals, also seems to help ease nausea and vomiting.  Limit foods that are high in fat and processed sugars, such as fried and sweet foods.  To prevent constipation: ? Eat foods that are high in fiber, such as fresh fruits and vegetables, whole grains, and beans. ? Drink enough fluid to keep your urine clear or pale yellow. Activity  Exercise only as directed by your health care provider. Most women can continue their usual exercise routine during pregnancy. Try to exercise for 30 minutes at least 5 days a week. Exercising will help you: ? Control your weight. ? Stay in shape. ? Be prepared for labor and delivery.  Experiencing pain or cramping in the lower abdomen or lower back is a good sign that you should stop exercising. Check with your health care provider before  continuing with normal exercises.  Try to avoid standing for long periods of time. Move your legs often if you must stand in one place for a long time.  Avoid heavy lifting.  Wear low-heeled shoes and practice good posture.  You may continue to have sex unless your health care provider tells you not to. Relieving pain and discomfort  Wear a good support bra to relieve breast tenderness.  Take warm sitz baths to soothe any pain or discomfort caused by hemorrhoids. Use hemorrhoid cream if your health care provider approves.  Rest with your legs elevated if you have leg cramps or low back pain.  If you develop varicose veins in your legs, wear support hose. Elevate your feet for 15 minutes, 3-4 times a day. Limit salt in your diet. Prenatal care  Schedule your prenatal visits by the twelfth week of pregnancy. They are usually scheduled monthly at first, then more often in the last 2 months before delivery.  Write down your questions. Take them to your prenatal visits.  Keep all your prenatal visits as told by your health care provider. This is important. Safety  Wear your seat belt at all times when driving.  Make a list of emergency phone numbers, including numbers for family, friends, the hospital, and police and fire departments. General instructions  Ask your health care provider for a referral to a local prenatal education class. Begin classes no later than the beginning of month 6 of your pregnancy.  Ask for help if you have counseling or nutritional needs during pregnancy. Your health care provider can offer advice or refer you to specialists for help with various needs.  Do not use hot tubs, steam rooms, or saunas.  Do not douche or use tampons or scented sanitary pads.  Do not cross your legs for long periods of time.  Avoid cat litter boxes and soil used by cats. These carry germs  that can cause birth defects in the baby and possibly loss of the fetus by miscarriage  or stillbirth.  Avoid all smoking, herbs, alcohol, and medicines not prescribed by your health care provider. Chemicals in these products affect the formation and growth of the baby.  Do not use any products that contain nicotine or tobacco, such as cigarettes and e-cigarettes. If you need help quitting, ask your health care provider. You may receive counseling support and other resources to help you quit.  Schedule a dentist appointment. At home, brush your teeth with a soft toothbrush and be gentle when you floss. Contact a health care provider if:  You have dizziness.  You have mild pelvic cramps, pelvic pressure, or nagging pain in the abdominal area.  You have persistent nausea, vomiting, or diarrhea.  You have a bad smelling vaginal discharge.  You have pain when you urinate.  You notice increased swelling in your face, hands, legs, or ankles.  You are exposed to fifth disease or chickenpox.  You are exposed to [place] measles (rubella) and have never had it. Get help right away if:  You have a fever.  You are leaking fluid from your vagina.  You have spotting or bleeding from your vagina.  You have severe abdominal cramping or pain.  You have rapid weight gain or loss.  You vomit blood or material that looks like coffee grounds.  You develop a severe headache.  You have shortness of breath.  You have any kind of trauma, such as from a fall or a car accident. Summary  The first trimester of pregnancy is from week 1 until the end of week 13 (months 1 through 3).  Your body goes through many changes during pregnancy. The changes vary from woman to woman.  You will have routine prenatal visits. During those visits, your health care provider will examine you, discuss any test results you may have, and talk with you about how you are feeling. This information is not intended to replace advice given to you by your health care provider. Make sure you discuss any  questions you have with your health care provider. Document Released: 03/31/2001 Document Revised: 03/19/2017 Document Reviewed: 03/18/2016 Elsevier Patient Education  2020 [employer].

## 2019-03-03 NOTE — Progress Notes (Signed)
03/03/2019   Chief Complaint: Missed period  Transfer of Care Patient: no  History of Present Illness: Melissa Mendez is a 28 y.o. G2P0010 [redacted]w[redacted]d based on Patient's last menstrual period was 01/02/2019. with an Estimated Date of Delivery: 10/09/19, with the above CC.   Her periods were: missed period and does not recall well when her periods were She was using no method when she conceived.  She has Positive signs or symptoms of nausea/vomiting of pregnancy. She has Negative signs or symptoms of miscarriage or preterm labor She identifies Negative Zika risk factors for her and her partner On any different medications around the time she conceived/early pregnancy: No  History of varicella: Yes   ROS: A 12-point review of systems was performed and negative, except as stated in the above HPI.  OBGYN History: As per HPI. OB History  Gravida Para Term Preterm AB Living  2       1    SAB TAB Ectopic Multiple Live Births  1            # Outcome Date GA Lbr Len/2nd Weight Sex Delivery Anes PTL Lv  2 Current           1 SAB             Any issues with any prior pregnancies: no Any prior children are healthy, doing well, without any problems or issues: no History of pap smears: Yes. Last pap smear 11/2018. Abnormal: yes ASCUS, NEG HPV History of STIs: No   Past Medical History: Past Medical History:  Diagnosis Date  . Anxiety   . Depression   . Fibromyalgia   . Fibromyalgia   . Migraines   . Obesity     Past Surgical History: Past Surgical History:  Procedure Laterality Date  . TONSILLECTOMY AND ADENOIDECTOMY  1993  . WISDOM TOOTH EXTRACTION      Family History:  Family History  Problem Relation Age of Onset  . Hypertension Father    She denies any female cancers, bleeding or blood clotting disorders.  She denies any history of mental retardation, birth defects or genetic disorders in her or the FOB's history  Social History:  Social History   Socioeconomic History  .  Marital status: Single    Spouse name: Not on file  . Number of children: Not on file  . Years of education: Not on file  . Highest education level: Not on file  Occupational History  . Not on file  Social Needs  . Financial resource strain: Not on file  . Food insecurity    Worry: Not on file    Inability: Not on file  . Transportation needs    Medical: Not on file    Non-medical: Not on file  Tobacco Use  . Smoking status: Current Every Day Smoker  . Smokeless tobacco: Never Used  Substance and Sexual Activity  . Alcohol use: No    Alcohol/week: 0.0 standard drinks  . Drug use: No  . Sexual activity: Yes    Birth control/protection: None  Lifestyle  . Physical activity    Days per week: Not on file    Minutes per session: Not on file  . Stress: Not on file  Relationships  . Social Herbalist on phone: Not on file    Gets together: Not on file    Attends religious service: Not on file    Active member of club or organization: Not on file  Attends meetings of clubs or organizations: Not on file    Relationship status: Not on file  . Intimate partner violence    Fear of current or ex partner: Not on file    Emotionally abused: Not on file    Physically abused: Not on file    Forced sexual activity: Not on file  Other Topics Concern  . Not on file  Social History Narrative   Single.   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   Enjoys painting, being with her friends and boyfriend.      Any pets in the household: no  Allergy: Allergies  Allergen Reactions  . Sulfa Antibiotics Other (See Comments)    Was told as a child that she is allergic to sulfa drugs; reaction unknown    Current Outpatient Medications:  Current Outpatient Medications:  .  Prenatal Vit-Fe Fumarate-FA (PRENATAL MULTIVITAMIN) TABS tablet, Take 1 tablet by mouth daily at 12 noon., Disp: , Rfl:  .  nitrofurantoin, macrocrystal-monohydrate,  (MACROBID) 100 MG capsule, Take 1 capsule (100 mg total) by mouth 2 (two) times daily. (Patient not taking: Reported on 03/03/2019), Disp: 14 capsule, Rfl: 0 .  varenicline (CHANTIX STARTING MONTH PAK) 0.5 MG X 11 & 1 MG X 42 tablet, Take one 0.5 mg tablet by mouth once daily for 3 days, then increase to one 0.5 mg tablet twice daily for 4 days, then increase to one 1 mg tablet twice daily. (Patient not taking: Reported on 03/03/2019), Disp: 53 tablet, Rfl: 0   Physical Exam:   BP 138/80   Wt 198 lb (89.8 kg)   LMP 01/02/2019   BMI 30.11 kg/m  Body mass index is 30.11 kg/m. Constitutional: Well nourished, well developed female in no acute distress.  Neck:  Supple, normal appearance, and no thyromegaly  Cardiovascular: S1, S2 normal, no murmur, rub or gallop, regular rate and rhythm Respiratory:  Clear to auscultation bilateral. Normal respiratory effort Abdomen: positive bowel sounds and no masses, hernias; diffusely non tender to palpation, non distended Breasts: breasts appear normal, no suspicious masses, no skin or nipple changes or axillary nodes. Neuro/Psych:  Normal mood and affect.  Skin:  Warm and dry.  Lymphatic:  No inguinal lymphadenopathy.   Pelvic exam: is not limited by body habitus EGBUS: within normal limits, Vagina: within normal limits and with no blood in the vault, Cervix: normal appearing cervix without discharge or lesions, closed/long/high, Uterus:  enlarged: 6 weeks, and Adnexa:  normal adnexa  Assessment: Melissa Mendez is a 28 y.o. G2P0010 [redacted]w[redacted]d based on Patient's last menstrual period was 01/02/2019. with an Estimated Date of Delivery: 10/09/19,  for prenatal care.  Plan:  1) Avoid alcoholic beverages. 2) Patient encouraged not to smoke.  3) Discontinue the use of all non-medicinal drugs and chemicals.  4) Take prenatal vitamins daily.  5) Seatbelt use advised 6) Nutrition, food safety (fish, cheese advisories, and high nitrite foods) and exercise discussed. 7)  Hospital and practice style delivering at [facility] discussed  8) Patient is asked about travel to areas at risk for the [place] virus, and counseled to avoid travel and exposure to mosquitoes or sexual partners who may have themselves been exposed to the virus. Testing is discussed, and will be ordered as appropriate.  9) Childbirth classes at [facility] advised 10) Genetic Screening, such as with 1st Trimester Screening, cell free fetal DNA, AFP testing, and Ultrasound, as well as with amniocentesis and [facility] as appropriate, is discussed  with patient. She plans to consider genetic testing this pregnancy.  Labs and [place] nv as does not have ins/medicaid yet Desires paternity testing as FOB is concerned although she is not (only one partner).  Counseled this would need to be postpartum  [place] for dating as has uncertain LMP  Problem list reviewed and updated.  Barnett Applebaum, MD, [facility], [facility] 03/03/2019  2:36 PM

## 2019-03-04 LAB — BETA HCG QUANT (REF LAB): hCG Quant: 30013 m[IU]/mL

## 2019-03-06 ENCOUNTER — Telehealth: Payer: Self-pay | Admitting: Obstetrics & Gynecology

## 2019-03-06 NOTE — Telephone Encounter (Signed)
Patient states she missed a call from her provider regarding results. Same cb

## 2019-03-07 LAB — GC/CHLAMYDIA PROBE AMP (~~LOC~~) NOT AT ARMC
Chlamydia: NEGATIVE
Comment: NEGATIVE
Comment: NORMAL
Neisseria Gonorrhea: NEGATIVE

## 2019-03-07 MED FILL — BUPRENORPHINE HCL 8 MG SUBL: 8 | 7 days supply | Qty: 14 | Fill #0

## 2019-03-08 ENCOUNTER — Other Ambulatory Visit: Payer: Self-pay | Admitting: Obstetrics & Gynecology

## 2019-03-08 DIAGNOSIS — N390 Urinary tract infection, site not specified: Secondary | ICD-10-CM

## 2019-03-08 LAB — URINE CULTURE

## 2019-03-08 MED ORDER — NITROFURANTOIN MONOHYD MACRO 100 MG PO CAPS: 100.0000 mg | ORAL_CAPSULE | Freq: Two times a day (BID) | ORAL | 0 refills | Status: DC

## 2019-03-08 NOTE — Progress Notes (Signed)
Let her know UTI found on UA at NOB visit.  ABX called in

## 2019-03-08 NOTE — Progress Notes (Signed)
Left voice message to advise pt to pick up rx for uti

## 2019-03-10 ENCOUNTER — Telehealth: Payer: Self-pay

## 2019-03-10 NOTE — Telephone Encounter (Signed)
Unable to leave message. VM not set up yet. SMS notification sent with [facility] #. Message sent in My chart.

## 2019-03-10 NOTE — Telephone Encounter (Signed)
Patient reports she has had quite a bit of morning sickness, but this morning it was black. She is inquiring if this is normal. [phone]

## 2019-03-10 NOTE — Telephone Encounter (Signed)
Spoke w/patient. She states she doesn't vomit every day. Only every other or every few days. This morning she vomitted green the first time then black. She hasn't vomited since. She will monitor and report to ED for eval if it continues. She doesn't desire any n/v meds at this time as they made her too sleepy in the past. Advised can try OTC B6 and Unisom. She will contact us back if she has any further problems/questions/concerns.

## 2019-03-13 ENCOUNTER — Ambulatory Visit (INDEPENDENT_AMBULATORY_CARE_PROVIDER_SITE_OTHER): Payer: Self-pay

## 2019-03-13 ENCOUNTER — Other Ambulatory Visit: Payer: Self-pay

## 2019-03-13 ENCOUNTER — Ambulatory Visit (INDEPENDENT_AMBULATORY_CARE_PROVIDER_SITE_OTHER): Payer: Self-pay | Admitting: Certified Nurse Midwife

## 2019-03-13 VITALS — BP 150/90 | Wt 201.0 lb

## 2019-03-13 DIAGNOSIS — Z3687 Encounter for antenatal screening for uncertain dates: Secondary | ICD-10-CM

## 2019-03-13 DIAGNOSIS — N926 Irregular menstruation, unspecified: Secondary | ICD-10-CM

## 2019-03-13 DIAGNOSIS — O3680X0 Pregnancy with inconclusive fetal viability, not applicable or unspecified: Secondary | ICD-10-CM

## 2019-03-13 DIAGNOSIS — O0990 Supervision of high risk pregnancy, unspecified, unspecified trimester: Secondary | ICD-10-CM

## 2019-03-13 NOTE — Progress Notes (Signed)
Pt understandably upset at u/s results.  All questions were not asked to update the chart.rj

## 2019-03-16 ENCOUNTER — Encounter: Payer: Self-pay | Admitting: Certified Nurse Midwife

## 2019-03-16 DIAGNOSIS — Z3687 Encounter for antenatal screening for uncertain dates: Secondary | ICD-10-CM | POA: Insufficient documentation

## 2019-03-16 DIAGNOSIS — O0990 Supervision of high risk pregnancy, unspecified, unspecified trimester: Secondary | ICD-10-CM | POA: Insufficient documentation

## 2019-03-16 NOTE — Progress Notes (Signed)
ROB/dating scan today. : Very upset, was crying because no fetus seen on ultrasound today. Uncertain LMP. No vaginal bleeding or cramping  Ultrasound today: There is a single gestational sac seen within the uterus. There is a large yolk sac. No fetal pole nor heartbeat is seen today.  GS: 20.1 mm YS: 12.0 mm  Right Ovary is normal in appearance. Left Ovary is normal appearance. Corpus luteal cyst:  is not visualized Survey of the adnexa demonstrates no adnexal masses. There is no free peritoneal fluid in the cul de sac.  Impression: 1. There is a single 62mm gestational sac seen within the uterus. A Large yolk sac is seen. No fetal pole is seen. 2. Normal uterus and ovaries.   Recommendations: 1.Clinical correlation with the patient's History and Physical Exam. 2. These findings are suspicious for, but not diagnostic of, pregnancy failure.* 3.  Recommend follow up ultrasound in 7-11 days to reassess for increased size of yolk sac (> 25 mm would be diagnostic of pregnancy failure) or no fetal pole with cardiac activity 11 days after visualization of a gestational sac with a yolk sac.*   Discussed the above findings with the patient and advised that will need to repeat ultrasound in about 10 days  If there is a viable pregnancy, will need to get NOB labs at that time.   Dalia Heading, CNM

## 2019-03-21 MED FILL — BUPRENORPHINE HCL 8 MG SUBL: 8 | 7 days supply | Qty: 14 | Fill #0

## 2019-03-27 ENCOUNTER — Other Ambulatory Visit: Payer: Self-pay

## 2019-03-27 ENCOUNTER — Ambulatory Visit (INDEPENDENT_AMBULATORY_CARE_PROVIDER_SITE_OTHER): Payer: Medicaid Other

## 2019-03-27 ENCOUNTER — Ambulatory Visit (INDEPENDENT_AMBULATORY_CARE_PROVIDER_SITE_OTHER): Payer: Medicaid Other | Admitting: Obstetrics and Gynecology

## 2019-03-27 ENCOUNTER — Encounter: Payer: Self-pay | Admitting: Obstetrics and Gynecology

## 2019-03-27 VITALS — BP 148/94 | Wt 206.0 lb

## 2019-03-27 DIAGNOSIS — O3481 Maternal care for other abnormalities of pelvic organs, first trimester: Secondary | ICD-10-CM | POA: Diagnosis not present

## 2019-03-27 DIAGNOSIS — O021 Missed abortion: Secondary | ICD-10-CM

## 2019-03-27 DIAGNOSIS — Z3A01 Less than 8 weeks gestation of pregnancy: Secondary | ICD-10-CM

## 2019-03-27 DIAGNOSIS — Z3687 Encounter for antenatal screening for uncertain dates: Secondary | ICD-10-CM

## 2019-03-27 DIAGNOSIS — N8312 Corpus luteum cyst of left ovary: Secondary | ICD-10-CM | POA: Diagnosis not present

## 2019-03-27 MED ORDER — ONDANSETRON 4 MG PO TBDP: 4.0000 mg | ORAL_TABLET | Freq: Four times a day (QID) | ORAL | 0 refills | Status: DC | PRN

## 2019-03-27 MED ORDER — MISOPROSTOL 200 MCG PO TABS: 800.0000 ug | ORAL_TABLET | Freq: Once | ORAL | 0 refills | Status: DC

## 2019-03-27 MED ORDER — HYDROCODONE-ACETAMINOPHEN 5-325 MG PO TABS: 1.0000 | ORAL_TABLET | Freq: Four times a day (QID) | ORAL | 0 refills | Status: DC | PRN

## 2019-03-27 NOTE — Patient Instructions (Signed)
Misoprostol for Nonviable Pregnancy- Instructions for Patients  You have opted to use medical management in the treatment of your miscarriage. Other options incllude surgical evacuation of your uterus and expectant management. Medical management is 80-90% effective when used correctly. Studies have shown that women who use medical management are highly satisfied because they feel that they have some control of the process. They are able to avoid a surgical procedure, and they can experience this difficult time in private presumably your provider has already explained your options and the procedure to you. The information below is meant to clarify any questions that you may have, provide instructions on when you should seek help, and give you contact information. At some point during this process your provider will have checked your bloodtype and you should receive medication if your blood type is negative.  24-hour contact information All hours: Call [phone]  Immediate emergency: Call 911 in cases of very heavy bleeding or other immediate emergency.  When to call the clinic / doctor on call . If you bleed heavily and you soak more than 2 maxi-pads in an hour for 2 hours in a row, please call the clinic at any hour of the day or night. This does not mean you will definitely need to come to the hospital. . If you have fever or chills more than 24 hours after misoprostol. Low grade fevers can be normal only in the hours after using misoprostol. . If you have foul smelling vaginal discharge or increasing abdominal pain after you pass the pregnancy  . 24-72 hours after misoprostol to report your symptoms and schedule any necessary follow-up before your final appointment in 2 weeks. . Make sure that you clarify your blood type with your provider so that you can receive theappropriate medication if your blood type is negative.   When to go to an emergency room If think you are bleeding very  heavily and/or you are feeling dizzy, weak or light headed. If you are bleeding heavily but are not sure if it is too much, you may call the emergency telephone numbers above and discuss it with a health professional. You might be directed to an emergency room. If you cannot reach a doctor and you are concerned, go to the emergency room. The day you use misoprostol As the provider or the clinic staff explained in the clinic, you will place the misoprostol pills in your vagina at home.  Preparing for your misoprostol insertion 1. You should have a phone, a bathroom and your support person nearby for the day. You also should have your misoprostol tablets, pain medication, and maxi-pads. 2. If you are hungry, eat a light meal before you start and drink plenty of water during the day. 3. Since the process may take 5-7 hours or more, we recommend placing the tablets in the morning or early afternoon.  How to place the misoprostol in your vagina Wash your hands. While lying down or standing, hold 1 pill between your first and second fingers (your point and your middle finger) and push the pill high up into your vagina with one of the fingers.  Repeat this procedure with all 4 pills. You may do regular activities around the house after placing the misoprostol.  Do not put anything in your vagina from the time you put in the misoprostol untill you stop bleeding. (This means no sex or tampons, until you stop bleeding which may take 2 weeks or more.)  If you have no response  to the initial dose of misoprostol, you may repeat it 24 hours after your initial dose. You should follow the same instructions as with the first dose. If you have no response to the misoprostol after 2 doses, call your provider who will then schedule an evacuation procedure.  What to expect after the misoprostol Abdominal cramping and pain and moderate to heavy bleeding are normal parts of the process during the hours when your uterus  passes the pregnancy. Most women pass blood clots.  1. Cramping usually starts between 30 minutes to 7 hours after you place the misoprostol in your vagina. 2. Bleeding usually starts between 30 minutes to 10 hours after you place the misoprostol in your vagina. Although most women bleed for about 2 weeks, some bleed for a shorter period, and others bleed for a longer time (about 10% of women bleed lightly for one month or more). These variations are normal. Some women will have a "second wave" of heavier bleeding after the bleeding slows down, but bleeding should not be heavier than a period for more than a few days.  3. Heavy bleeding and strong cramps usually last from 1 to 4 hours. Call if you soak through more than 2 large maxi pads per hour for 2 hours in a row. 4. For pain: Resting and using a heating pad or hot water bottle may help. Ibuprofen usually helps with the pain. If your pain is not manageable with ibuprofen, use a stronger medication (Vicodin or Tylenol with codeine) as prescribed. Ibuprofen (Motrin, Advil, etc.) you may take 800 mg of ibuprofen (4 over-the-counter tablets) every 6 to 8 hours. You may take the first pills as soon as you feel you need something for pain. Ibuprofen usually does not cause side effects such as sleepiness, nausea or dizziness. 5. Other misoprostol side effects: These side effects are common and usually only last 1 to 4 hours. If any of these side effects make you very uncomfortable, you may treat your symptoms with overthe-counter medicines. Call if over-the-counter medications do not relieve your symptoms.  a. Nausea or vomiting: Over the counter treatment: Benadryl 25 mg to 50 mg every 6 hours as needed, or Dramamine          25-50 mg every 6 hours as needed.  b. Diarrhea: Over the counter treatment: Imodium 2 tablets (4 mg) once.  c. Fever, chills in the first 24 hours: Over the counter treatment: Tylenol 500 to 1000 mg every 4 hours, Ibuprofen up 800   mg  (4 over-the-counter tablets) every 6 hours. If you have already taken Tylenol of Ibuprofen for pain, do not take   more of the same medication for fever or chills.  Call the clinic if your fever is higher than 101 F (or 39 C) after you take Tylenol or Ibuprofen.  How to tell if you passed the pregnancy Most women have bleeding and painful cramping. As you pass the pregnancy the bleeding is usually heavy and the cramping very strong. This usually lasts 1-4 hours. Most women pass some blood clots into the toilet, and often the pregnancy is in one of those clots. After the pregnancy passes, the cramps decrease and the bleeding slows down quite a bit. After passing the pregnancy, within a few hours, cramps and bleeding should be much improved. The last part of the process is to confirm with a health professional within 24-72 hours that you passed the pregnancy. You will be instructed regarding a follow-up appointment as needed.**  **  There is often a small amount of pregnancy tissue (mostly uterine lining) that continues to pass as long as you have bleeding. You may even have a "second wave" of heavy bleeding after your final visit. You may use the same criteria to call the clinic. Some of the "failures" with misoprostol are when a woman passes the pregnancy but needs still needs a uterine evacuation due to continued bleeding.

## 2019-03-27 NOTE — Progress Notes (Signed)
Routine Prenatal Care Visit  Subjective  Melissa Mendez is a 28 y.o. G2P0010 at [redacted]w[redacted]d being seen today for ongoing prenatal care.  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  ----------------------------------------------------------------------------------- Patient reports some vaginal spotting. Denies cramping.    . Vag. Bleeding: None.   . Leaking Fluid denies.  U/S shows gestational sac with MSD 25.6 mm, yolk sac is 16 mm. No fetal pole visualized. ----------------------------------------------------------------------------------- The following portions of the patient's history were reviewed and updated as appropriate: allergies, current medications, past family history, past medical history, past social history, past surgical history and problem list. Problem list updated.  Objective  Blood pressure (!) 148/94, weight 206 lb (93.4 kg), last menstrual period 01/02/2019. Pregravid weight 198 lb (89.8 kg) Total Weight Gain 8 lb (3.629 kg) Urinalysis: Urine Protein    Urine Glucose    Fetal Status:           General:  Alert, oriented and cooperative. Patient is in no acute distress.  Skin: Skin is warm and dry. No rash noted.   Cardiovascular: Normal heart rate noted  Respiratory: Normal respiratory effort, no problems with respiration noted  Abdomen: Soft, gravid, appropriate for gestational age. Pain/Pressure: Absent     Pelvic:  Cervical exam deferred        Extremities: Normal range of motion.     Mental Status: Normal mood and affect. Normal behavior. Normal judgment and thought content.   Imaging Results US Ob Comp Less 14 Wks  Result Date: 03/27/2019 Patient Name: ROHA POND DOB: 1990-08-23 MRN:  [id number] ULTRASOUND REPORT Location: [facility] Date of Service: 03/27/2019 Indications:dating Findings: Nelda [place]se[place] intrauterine pregnancy is visualized with a by gestational sac measurement is consistent with [redacted]w[redacted]d gestation, giving an (U/S) EDD of 11/16/2019. Mean sac diameter is 25.6 mm. Yolk sac is visualized and appears abnormal (enlarged at 16 mm). Amnion: not visualized Right Ovary is normal in appearance. Left Ovary is normal appearance. Corpus luteal cyst:  Left ovary Survey of the adnexa demonstrates no adnexal masses. There is no free peritoneal fluid in the cul de sac. Impression: 1. [redacted]w[redacted]d by gestational sac size only that confirms a Intrauterine pregnancy by U/S. 2. No fetal pole seen at this time. 3 .Minimal change from  prior U/S that was preformed on 03/13/2019. 4. Findings meet criteria for Pregnancy Failure.* Jenine M. Alessi     RDMS *Criteria from the Society of Radiologists in [place] on Early First Trimester Diagnosis of Miscarriage and Exclusion of a Viable Intrauterine Pregnancy, October 2012. The ultrasound images and findings were reviewed by me and I agree with the above report. Prentice Docker, MD, [facility], [facility] 03/27/2019 9:26 AM       Assessment   28 y.o. G2P0010 at [redacted]w[redacted]d by  10/09/2019, by Last Menstrual Period presenting for routine prenatal visit  Plan     ICD-10-CM   1. Missed abortion  O02.1 misoprostol (CYTOTEC) 200 MCG tablet    ondansetron (ZOFRAN ODT) 4 MG disintegrating tablet    HYDROcodone-acetaminophen (NORCO/VICODIN) 5-325 MG tablet   Condolences were offered to the patient.  I stressed that while emotionally difficult, that this did not occur because of an actions or inactions by the patient.  Somewhere between  10-20% of identified first trimester pregnancies will unfortunately end in miscarriage.  Given this relatively high incidence rate, further diagnostic testing such as chromosome  analysis is generally not clinically relevant nor recommended.  Although the chromosomal abnormalities have been implicated at rates as high as 70% in some studies, these are generally random and do not infer and increased risk of recurrence with subsequent pregnancies.  However, 3 or more consecutive first trimester losses are relatively uncommon, and these patient generally do benefit from additional work up to determine a potential modifiable etiology.   We briefly discussed management options including expectant management, medical management, and surgical management as well as their relative success rates and complications. Approximately 80% of first trimester miscarriages will pass successfully but may require a time frame of up to 8 weeks ([place] 2000 May 2015 "Early Pregnancy Loss").  Medical management using 852mcg of misoprostil administered every 3-hrs as needed for up to 2 doses speeds up the time frame to completion significantly, has literature supporting its use up to 63 days or [redacted]w[redacted]d gestation and results in a passage rate of 84-85% (ACOG Practice Bulletin 143 March 2014 "Medical Management of First-Trimester Abortion").  Dilation and curettage has the highest rate of uterine evacuation, but carries with is operative cost, surgical and anesthetic risk.  While these risk are relatively small they nevertheless include infection, bleeding, uterine perforation, formation of uterine synechia, and in rare cases death.   We discussed repeat ultrasound and or trending HCG levels if the patient wishes to pursue these prior to making her decision.  Clinically I am confident of the diagnosis, but I do not want any doubts in the patient's mind regarding the plan of management she chooses to adopt.  She did not want time to digest this information,as she already thought this would be the outcome of the ultrasound. She elects medication management.  Rx for misoprostol and instructions for use  provided.  She was instructed to let me know, if no tissue passed after second dosing of misoprostol.  She was given precautions regarding too much bleeding (filling > 2 pads per hour for > 2 hours).  Will discuss workup for recurrent pregnancy loss once this has resolved. I arranged follow up for 2 weeks to ensure she completes passage of all POC.   The patient is Rh positive and rhogam is therefore no indicated.      Return in about 2 weeks (around 04/10/2019) for as needed, if still have positive pregnancy test.  Prentice Docker, MD, [place], [facility] 03/27/2019 4:49 PM

## 2019-03-29 ENCOUNTER — Telehealth: Payer: Self-pay

## 2019-03-29 NOTE — Telephone Encounter (Signed)
Pt calling triage crying in pain, she took the medication to help pass the fetus, she had a u/s and there was no heartbeat.  today she is in the worst pain she's ever felt, she's out of the pain med's. Wanting to know if she can have a refill. She's aware SDJ is not in the office. I would need to ask another provider,

## 2019-03-29 NOTE — Telephone Encounter (Signed)
Can you call pt and let her know we cannot send in any pain meds and if shes in that much pain she can go to the ER. Please advise since SDJ not in the office today

## 2019-03-29 NOTE — Telephone Encounter (Signed)
Pt aware via vm 

## 2019-03-29 NOTE — Telephone Encounter (Signed)
no

## 2019-08-08 ENCOUNTER — Ambulatory Visit: Payer: Self-pay | Admitting: Family Medicine

## 2019-08-17 ENCOUNTER — Encounter: Payer: Self-pay | Admitting: Family Medicine

## 2019-08-17 ENCOUNTER — Ambulatory Visit (INDEPENDENT_AMBULATORY_CARE_PROVIDER_SITE_OTHER): Payer: PRIVATE HEALTH INSURANCE | Admitting: Family Medicine

## 2019-08-17 ENCOUNTER — Other Ambulatory Visit: Payer: Self-pay

## 2019-08-17 VITALS — BP 116/84 | HR 103 | Temp 98.9°F | Ht 67.5 in | Wt 231.0 lb

## 2019-08-17 DIAGNOSIS — R252 Cramp and spasm: Secondary | ICD-10-CM | POA: Diagnosis not present

## 2019-08-17 DIAGNOSIS — F419 Anxiety disorder, unspecified: Secondary | ICD-10-CM

## 2019-08-17 DIAGNOSIS — M797 Fibromyalgia: Secondary | ICD-10-CM

## 2019-08-17 DIAGNOSIS — R1013 Epigastric pain: Secondary | ICD-10-CM | POA: Diagnosis not present

## 2019-08-17 DIAGNOSIS — Z7689 Persons encountering health services in other specified circumstances: Secondary | ICD-10-CM

## 2019-08-17 DIAGNOSIS — F329 Major depressive disorder, single episode, unspecified: Secondary | ICD-10-CM

## 2019-08-17 MED ORDER — SUCRALFATE 1 G PO TABS: 1.0000 g | ORAL_TABLET | Freq: Three times a day (TID) | ORAL | 0 refills | Status: DC

## 2019-08-17 MED ORDER — CYCLOBENZAPRINE HCL 10 MG PO TABS
10.0000 mg | ORAL_TABLET | Freq: Three times a day (TID) | ORAL | 0 refills | Status: DC | PRN
Start: 2019-08-17 — End: 2019-10-18

## 2019-08-17 MED ORDER — GABAPENTIN 600 MG PO TABS
600.0000 mg | ORAL_TABLET | Freq: Three times a day (TID) | ORAL | 2 refills | Status: DC
Start: 2019-08-17 — End: 2019-11-09

## 2019-08-17 MED ORDER — PANTOPRAZOLE SODIUM 40 MG PO TBEC: 40.0000 mg | DELAYED_RELEASE_TABLET | Freq: Every day | ORAL | 1 refills | Status: DC

## 2019-08-17 NOTE — Progress Notes (Signed)
BP 116/84   Pulse (!) 103   Temp 98.9 F (37.2 C) (Oral)   Ht 5' 7.5" (1.715 m)   Wt 231 lb (104.8 kg)   LMP 01/02/2019   SpO2 98%   Breastfeeding Unknown   BMI 35.65 kg/m    Subjective:    Patient ID: Melissa Mendez, female    DOB: March 15, 1991, 29 y.o.   MRN: [id number]  HPI: Melissa Mendez is a 29 y.o. female  Chief Complaint  Patient presents with  . Establish Care    pt wants to discuss about body cramps  . Emesis    x the past 2-3 weeks. not eating well  . Gastroesophageal Reflux  . Fibromyalgia   Here today to establish care.   Esophageal burning the past few weeks, N/V at times. States she's always dealt with reflux and sometimes will wake up throwing up and then go back to sleep. Belching often as well. Doesn't seem to be better or worse with eating. Denies melena, hematemesis. Does admit to taking ibuprofen 3-4 times daily for her chronic pain sxs.   Fibromyalgia, diagnosed about 7-8 years ago. Was on pain medication for quite some time but became dependent and is now off of them and does not wish to be back  - used to be on gabapentin and did very well on that regimen. Currently also dealing with muscle cramping in addition to her joint pains. Tries to be well hydrated and eat healthy.   Taking cymablta for depression and fibromyalgia, tolerating well.   Depression screen PHQ 2/9 08/17/2019  Decreased Interest 1  Down, Depressed, Hopeless 1  PHQ - 2 Score 2  Altered sleeping 2  Tired, decreased energy 2  Change in appetite 2  Feeling bad or failure about yourself  0  Trouble concentrating 0  Moving slowly or fidgety/restless 0  Suicidal thoughts 1  PHQ-9 Score 9   GAD 7 : Generalized Anxiety Score 08/17/2019  Nervous, Anxious, on Edge 1  Control/stop worrying 0  Worry too much - different things 0  Trouble relaxing 0  Restless 0  Easily annoyed or irritable 0  Afraid - awful might happen 0  Total GAD 7 Score 1     Relevant past medical, surgical,  family and social history reviewed and updated as indicated. Interim medical history since our last visit reviewed. Allergies and medications reviewed and updated.  Review of Systems  Per HPI unless specifically indicated above     Objective:    BP 116/84   Pulse (!) 103   Temp 98.9 F (37.2 C) (Oral)   Ht 5' 7.5" (1.715 m)   Wt 231 lb (104.8 kg)   LMP 01/02/2019   SpO2 98%   Breastfeeding Unknown   BMI 35.65 kg/m   Wt Readings from Last 3 Encounters:  08/17/19 231 lb (104.8 kg)  03/27/19 206 lb (93.4 kg)  03/13/19 201 lb (91.2 kg)    Physical Exam Vitals and nursing note reviewed.  Constitutional:      Appearance: Normal appearance. She is not ill-appearing.  HENT:     Head: Atraumatic.  Eyes:     Extraocular Movements: Extraocular movements intact.     Conjunctiva/sclera: Conjunctivae normal.  Cardiovascular:     Rate and Rhythm: Normal rate and regular rhythm.     Heart sounds: Normal heart sounds.  Pulmonary:     Effort: Pulmonary effort is normal.     Breath sounds: Normal breath sounds.  Abdominal:  General: Bowel sounds are normal. There is no distension.     Palpations: Abdomen is soft.     Tenderness: There is abdominal tenderness (epigastric ttp). There is no right CVA tenderness, left CVA tenderness or guarding.  Musculoskeletal:        General: Normal range of motion.     Cervical back: Normal range of motion and neck supple.  Skin:    General: Skin is warm and dry.  Neurological:     Mental Status: She is alert and oriented to person, place, and time.  Psychiatric:        Mood and Affect: Mood normal.        Thought Content: Thought content normal.        Judgment: Judgment normal.     Results for orders placed or performed in visit on 08/17/19  Comprehensive metabolic panel  Result Value Ref Range   Glucose 87 65 - 99 mg/dL   BUN 7 6 - 20 mg/dL   Creatinine, Ser 0.89 0.57 - 1.00 mg/dL   GFR calc non Af Amer 87 >59 mL/min/1.73   GFR  calc Af Amer 101 >59 mL/min/1.73   BUN/Creatinine Ratio 8 (L) 9 - 23   Sodium 139 134 - 144 mmol/L   Potassium 4.5 3.5 - 5.2 mmol/L   Chloride 105 96 - 106 mmol/L   CO2 23 20 - 29 mmol/L   Calcium 8.8 8.7 - 10.2 mg/dL   Total Protein 6.6 6.0 - 8.5 g/dL   Albumin 4.0 3.9 - 5.0 g/dL   Globulin, Total 2.6 1.5 - 4.5 g/dL   Albumin/Globulin Ratio 1.5 1.2 - 2.2   Bilirubin Total 0.3 0.0 - 1.2 mg/dL   Alkaline Phosphatase 54 39 - 117 IU/L   AST 16 0 - 40 IU/L   ALT 16 0 - 32 IU/L  Magnesium  Result Value Ref Range   Magnesium 1.9 1.6 - 2.3 mg/dL      Assessment & Plan:   Problem List Items Addressed This Visit      Other   Fibromyalgia    Restart gabapentin, flexeril prn. Continue to monitor for better pain control      Relevant Medications   DULoxetine (CYMBALTA) 30 MG capsule   ibuprofen (ADVIL) 400 MG tablet   gabapentin (NEURONTIN) 600 MG tablet   cyclobenzaprine (FLEXERIL) 10 MG tablet   Anxiety and depression    Stable and under fairly good control, continue current regimen      Relevant Medications   DULoxetine (CYMBALTA) 30 MG capsule    Other Visit Diagnoses    Epigastric abdominal pain    -  Primary   Suspect gastritis from frequent NSAID use and stress. Tx with protonix, carafate, and d/c of NSAIDs. F/u if sxs not improving   Encounter to establish care       Leg cramp       Relevant Orders   Comprehensive metabolic panel (Completed)   Magnesium (Completed)       Follow up plan: Return in about 3 months (around 11/16/2019) for CPE.

## 2019-08-18 LAB — COMPREHENSIVE METABOLIC PANEL
ALT: 16 IU/L (ref 0–32)
AST: 16 IU/L (ref 0–40)
Albumin/Globulin Ratio: 1.5 (ref 1.2–2.2)
Albumin: 4 g/dL (ref 3.9–5.0)
Alkaline Phosphatase: 54 IU/L (ref 39–117)
BUN/Creatinine Ratio: 8 — ABNORMAL LOW (ref 9–23)
BUN: 7 mg/dL (ref 6–20)
Bilirubin Total: 0.3 mg/dL (ref 0.0–1.2)
CO2: 23 mmol/L (ref 20–29)
Calcium: 8.8 mg/dL (ref 8.7–10.2)
Chloride: 105 mmol/L (ref 96–106)
Creatinine, Ser: 0.89 mg/dL (ref 0.57–1.00)
GFR calc Af Amer: 101 mL/min/{1.73_m2} (ref >59)
GFR calc non Af Amer: 87 mL/min/{1.73_m2} (ref >59)
Globulin, Total: 2.6 g/dL (ref 1.5–4.5)
Glucose: 87 mg/dL (ref 65–99)
Potassium: 4.5 mmol/L (ref 3.5–5.2)
Sodium: 139 mmol/L (ref 134–144)
Total Protein: 6.6 g/dL (ref 6.0–8.5)

## 2019-08-18 LAB — MAGNESIUM: Magnesium: 1.9 mg/dL (ref 1.6–2.3)

## 2019-08-18 NOTE — Assessment & Plan Note (Signed)
Restart gabapentin, flexeril prn. Continue to monitor for better pain control

## 2019-08-18 NOTE — Assessment & Plan Note (Signed)
Stable and under fairly good control, continue current regimen

## 2019-09-12 ENCOUNTER — Telehealth: Payer: Self-pay | Admitting: Family Medicine

## 2019-09-12 NOTE — Telephone Encounter (Signed)
Copied from [place] [id number]. Topic: General - Other >> Sep 12, 2019  2:31 PM Celene Kras wrote: Reason for CRM: Pt called stating that she has been getting sick lately. Pt states that she is needing to have a note for work saying that she is having episodes of vomiting and an autoimmune disease. Pt is needing to have proof to be excused from work due to her incidents. Please advise.

## 2019-09-13 NOTE — Telephone Encounter (Signed)
Called pt to schedule appt, no answer, left vm 

## 2019-09-13 NOTE — Telephone Encounter (Signed)
Needs appt

## 2019-09-15 ENCOUNTER — Ambulatory Visit (INDEPENDENT_AMBULATORY_CARE_PROVIDER_SITE_OTHER): Payer: PRIVATE HEALTH INSURANCE | Admitting: Family Medicine

## 2019-09-15 ENCOUNTER — Encounter: Payer: Self-pay | Admitting: Family Medicine

## 2019-09-15 VITALS — Wt 230.0 lb

## 2019-09-15 DIAGNOSIS — R1013 Epigastric pain: Secondary | ICD-10-CM | POA: Diagnosis not present

## 2019-09-15 MED ORDER — PANTOPRAZOLE SODIUM 40 MG PO TBEC
40.0000 mg | DELAYED_RELEASE_TABLET | Freq: Two times a day (BID) | ORAL | 2 refills | Status: DC
Start: 2019-09-15 — End: 2020-01-08

## 2019-09-15 NOTE — Progress Notes (Signed)
Wt 230 lb (104.3 kg)   LMP 01/02/2019   BMI 35.49 kg/m    Subjective:    Patient ID: Melissa Mendez, female    DOB: 13-May-1990, 29 y.o.   MRN: [id number]  HPI: Melissa Mendez is a 29 y.o. female  Chief Complaint  Patient presents with  . Nausea    x a few months  . Emesis     . This visit was completed via telephone due to the restrictions of the COVID-19 pandemic. All issues as above were discussed and addressed. Physical exam was done as above through visual confirmation on telephone. If it was felt that the patient should be evaluated in the office, they were directed there. The patient verbally consented to this visit. . Location of the patient: home . Location of the provider: work . Those involved with this call:  . Provider: Merrie Roof, PA-C . CMA: Lesle Chris, [place] . Front Desk/Registration: Jill Side  . Time spent on call: 22 minutes on the phone discussing health concerns. 5 minutes total spent in review of patient's record and preparation of their chart.  I verified patient identity using two factors (patient name and date of birth). Patient consents verbally to being seen via telemedicine visit today.   Doing some better with her epigastric pain and reflux sxs with the carafate, protonix regimen and not taking NSAIDs on empty stomach but still having several days per week of N/V and pain that keeps her from working. Denies bloody emesis, stool changes including dark tarry stools, melena, fevers, unintended weight loss, chills. Has not tried keeping food log or eliminating certain foods from diet. Needing note for work stating she can return without restrictions.   Relevant past medical, surgical, family and social history reviewed and updated as indicated. Interim medical history since our last visit reviewed. Allergies and medications reviewed and updated.  Review of Systems  Per HPI unless specifically indicated above     Objective:    Wt 230 lb (104.3  kg)   LMP 01/02/2019   BMI 35.49 kg/m   Wt Readings from Last 3 Encounters:  09/15/19 230 lb (104.3 kg)  08/17/19 231 lb (104.8 kg)  03/27/19 206 lb (93.4 kg)    Physical Exam  Unable to perform PE due to patient lack of access to video technology for visit.   Results for orders placed or performed in visit on 08/17/19  Comprehensive metabolic panel  Result Value Ref Range   Glucose 87 65 - 99 mg/dL   BUN 7 6 - 20 mg/dL   Creatinine, Ser 0.89 0.57 - 1.00 mg/dL   GFR calc non Af Amer 87 >59 mL/min/1.73   GFR calc Af Amer 101 >59 mL/min/1.73   BUN/Creatinine Ratio 8 (L) 9 - 23   Sodium 139 134 - 144 mmol/L   Potassium 4.5 3.5 - 5.2 mmol/L   Chloride 105 96 - 106 mmol/L   CO2 23 20 - 29 mmol/L   Calcium 8.8 8.7 - 10.2 mg/dL   Total Protein 6.6 6.0 - 8.5 g/dL   Albumin 4.0 3.9 - 5.0 g/dL   Globulin, Total 2.6 1.5 - 4.5 g/dL   Albumin/Globulin Ratio 1.5 1.2 - 2.2   Bilirubin Total 0.3 0.0 - 1.2 mg/dL   Alkaline Phosphatase 54 39 - 117 IU/L   AST 16 0 - 40 IU/L   ALT 16 0 - 32 IU/L  Magnesium  Result Value Ref Range   Magnesium 1.9 1.6 - 2.3  mg/dL      Assessment & Plan:   Problem List Items Addressed This Visit      Other   Epigastric abdominal pain - Primary    Work note provided stating she can return to work, referral placed to GI for further eval given ongoing sxs. In meantime will increase protonix to BID, work on diet elimination trials for dairy, gluten, etc and continue avoidance of NSAIDs. Return precautions reviewed      Relevant Orders   Ambulatory referral to Gastroenterology       Follow up plan: No follow-ups on file.

## 2019-09-15 NOTE — Assessment & Plan Note (Signed)
Work note provided stating she can return to work, referral placed to GI for further eval given ongoing sxs. In meantime will increase protonix to BID, work on diet elimination trials for dairy, gluten, etc and continue avoidance of NSAIDs. Return precautions reviewed

## 2019-10-17 ENCOUNTER — Other Ambulatory Visit: Payer: Self-pay | Admitting: Family Medicine

## 2019-10-17 NOTE — Telephone Encounter (Signed)
Requested medications are due for refill today?  Yes  Requested medications are on active medication list? Yes  Last Refill:   08/17/2019  # 30 with no refills   Future visit scheduled?  No   Notes to Clinic:  This medication refill cannot be delegated.

## 2019-10-18 NOTE — Telephone Encounter (Signed)
LOV 09/15/19

## 2019-11-09 ENCOUNTER — Other Ambulatory Visit: Payer: Self-pay | Admitting: Family Medicine

## 2019-11-09 NOTE — Telephone Encounter (Signed)
Requested  medications are  due for refill today yes  Requested medications are on the active medication list yes  Last refill 6/24  Last visit 5/28 (recheck from 4/29)  Future visit scheduled NO, office note states return around 7/29 to assess pain control  Notes to clinic Unsure if this was to be  continued.

## 2019-11-22 ENCOUNTER — Ambulatory Visit: Payer: PRIVATE HEALTH INSURANCE | Admitting: Family Medicine

## 2019-11-23 ENCOUNTER — Ambulatory Visit (INDEPENDENT_AMBULATORY_CARE_PROVIDER_SITE_OTHER): Payer: PRIVATE HEALTH INSURANCE | Admitting: Unknown Physician Specialty

## 2019-11-23 ENCOUNTER — Other Ambulatory Visit: Payer: Self-pay

## 2019-11-23 ENCOUNTER — Ambulatory Visit: Payer: PRIVATE HEALTH INSURANCE | Admitting: Unknown Physician Specialty

## 2019-11-23 ENCOUNTER — Encounter: Payer: Self-pay | Admitting: Family Medicine

## 2019-11-23 ENCOUNTER — Encounter: Payer: Self-pay | Admitting: Unknown Physician Specialty

## 2019-11-23 VITALS — BP 139/95 | HR 84 | Temp 99.3°F | Wt 234.2 lb

## 2019-11-23 DIAGNOSIS — G4710 Hypersomnia, unspecified: Secondary | ICD-10-CM

## 2019-11-23 DIAGNOSIS — F329 Major depressive disorder, single episode, unspecified: Secondary | ICD-10-CM

## 2019-11-23 DIAGNOSIS — R5382 Chronic fatigue, unspecified: Secondary | ICD-10-CM | POA: Diagnosis not present

## 2019-11-23 DIAGNOSIS — F419 Anxiety disorder, unspecified: Secondary | ICD-10-CM | POA: Diagnosis not present

## 2019-11-23 DIAGNOSIS — M797 Fibromyalgia: Secondary | ICD-10-CM

## 2019-11-23 DIAGNOSIS — R6889 Other general symptoms and signs: Secondary | ICD-10-CM

## 2019-11-23 DIAGNOSIS — E875 Hyperkalemia: Secondary | ICD-10-CM

## 2019-11-23 MED ORDER — DULOXETINE HCL 30 MG PO CPEP
60.0000 mg | ORAL_CAPSULE | Freq: Every day | ORAL | 3 refills | Status: DC
Start: 2019-11-23 — End: 2020-07-15

## 2019-11-23 NOTE — Assessment & Plan Note (Signed)
Will restart Cymbalta at 30 mg for 2 weeks then increase to 60 mg.

## 2019-11-23 NOTE — Assessment & Plan Note (Signed)
Pt with worsening symptoms.  Has had fertility issues and miscarriages.

## 2019-11-23 NOTE — Progress Notes (Signed)
BP (!) 139/95 (BP Location: Left Arm, Patient Position: Sitting, Cuff Size: Normal)   Pulse 84   Temp 99.3 F (37.4 C) (Oral)   Wt 234 lb 3.2 oz (106.2 kg)   LMP 11/19/2019   SpO2 100%   BMI 36.14 kg/m    Subjective:    Patient ID: Melissa Mendez, female    DOB: 1990/05/28, 29 y.o.   MRN: [id number]  HPI: Melissa Mendez is a 29 y.o. female  Chief Complaint  Patient presents with  . Referral    sleep study  . Depression   Depression Pt states she has wanted to do nothing but sleep.  She was taking Cymbalta and had done better but ran out 2 weeks ago.  States she sleeps 12-14 hours and will fall asleep during the day given a short break.  She does not feel rested.   Does not snore but sighs a lot at night.  She had been getting Subutex and Cymbalta in [place].  She will have to stop ging to him.     Depression screen [facility] 2/9 11/23/2019 08/17/2019  Decreased Interest 2 1  Down, Depressed, Hopeless 2 1  PHQ - 2 Score 4 2  Altered sleeping 2 2  Tired, decreased energy 2 2  Change in appetite 2 2  Feeling bad or failure about yourself  2 0  Trouble concentrating - 0  Moving slowly or fidgety/restless 1 0  Suicidal thoughts 1 1  PHQ-9 Score 14 9   Temperature changes Pt states she has frequent body temperature changes where she is hot and cold all the time.  This keeps her from being comfortable much of the time.    ADHD Was on Adderall for years and planning on going back to school.  She is nervous about getting through classes.    Relevant past medical, surgical, family and social history reviewed and updated as indicated. Interim medical history since our last visit reviewed. Allergies and medications reviewed and updated.  Review of Systems  Per HPI unless specifically indicated above     Objective:    BP (!) 139/95 (BP Location: Left Arm, Patient Position: Sitting, Cuff Size: Normal)   Pulse 84   Temp 99.3 F (37.4 C) (Oral)   Wt 234 lb 3.2 oz (106.2 kg)    LMP 11/19/2019   SpO2 100%   BMI 36.14 kg/m   Wt Readings from Last 3 Encounters:  11/23/19 234 lb 3.2 oz (106.2 kg)  09/15/19 230 lb (104.3 kg)  08/17/19 231 lb (104.8 kg)    Physical Exam Constitutional:      General: She is not in acute distress.    Appearance: Normal appearance. She is well-developed.  HENT:     Head: Normocephalic and atraumatic.  Eyes:     General: Lids are normal. No scleral icterus.       Right eye: No discharge.        Left eye: No discharge.     Conjunctiva/sclera: Conjunctivae normal.  Cardiovascular:     Rate and Rhythm: Normal rate.  Pulmonary:     Effort: Pulmonary effort is normal.  Abdominal:     Palpations: There is no hepatomegaly or splenomegaly.  Musculoskeletal:        General: Normal range of motion.  Skin:    Coloration: Skin is not pale.     Findings: No rash.  Neurological:     Mental Status: She is alert and oriented to person, place, and  time.  Psychiatric:        Behavior: Behavior normal.        Thought Content: Thought content normal.        Judgment: Judgment normal.     Results for orders placed or performed in visit on 08/17/19  Comprehensive metabolic panel  Result Value Ref Range   Glucose 87 65 - 99 mg/dL   BUN 7 6 - 20 mg/dL   Creatinine, Ser 0.89 0.57 - 1.00 mg/dL   GFR calc non Af Amer 87 >59 mL/min/1.73   GFR calc Af Amer 101 >59 mL/min/1.73   BUN/Creatinine Ratio 8 (L) 9 - 23   Sodium 139 134 - 144 mmol/L   Potassium 4.5 3.5 - 5.2 mmol/L   Chloride 105 96 - 106 mmol/L   CO2 23 20 - 29 mmol/L   Calcium 8.8 8.7 - 10.2 mg/dL   Total Protein 6.6 6.0 - 8.5 g/dL   Albumin 4.0 3.9 - 5.0 g/dL   Globulin, Total 2.6 1.5 - 4.5 g/dL   Albumin/Globulin Ratio 1.5 1.2 - 2.2   Bilirubin Total 0.3 0.0 - 1.2 mg/dL   Alkaline Phosphatase 54 39 - 117 IU/L   AST 16 0 - 40 IU/L   ALT 16 0 - 32 IU/L  Magnesium  Result Value Ref Range   Magnesium 1.9 1.6 - 2.3 mg/dL      Assessment & Plan:   Problem List Items  Addressed This Visit      Unprioritized   Anxiety and depression    Will restart Cymbalta at 30 mg for 2 weeks then increase to 60 mg.        Relevant Medications   DULoxetine (CYMBALTA) 30 MG capsule   Other Relevant Orders   TSH   Fibromyalgia    Pt with worsening symptoms.  Has had fertility issues and miscarriages.        Relevant Medications   buprenorphine (SUBUTEX) 8 MG SUBL SL tablet   DULoxetine (CYMBALTA) 30 MG capsule   Other Relevant Orders   ANA w/Reflex    Other Visit Diagnoses    Hypersomnia    -  Primary   Relevant Orders   PSG Sleep Study   Chronic fatigue       Relevant Orders   Comprehensive metabolic panel   CBC with Differential/Platelet   Physiologic disturbance of temperature regulation       Probably related to stopping Cymbalta   Relevant Orders   C-reactive protein   Sed Rate (ESR)       Follow up plan: Return in about 4 weeks (around 12/21/2019), or if symptoms worsen or fail to improve.

## 2019-11-24 ENCOUNTER — Telehealth: Payer: Self-pay | Admitting: Family Medicine

## 2019-11-24 LAB — COMPREHENSIVE METABOLIC PANEL
ALT: 11 IU/L (ref 0–32)
AST: 14 IU/L (ref 0–40)
Albumin/Globulin Ratio: 1.8 (ref 1.2–2.2)
Albumin: 4.9 g/dL (ref 3.9–5.0)
Alkaline Phosphatase: 65 IU/L (ref 48–121)
BUN/Creatinine Ratio: 10 (ref 9–23)
BUN: 8 mg/dL (ref 6–20)
Bilirubin Total: 0.3 mg/dL (ref 0.0–1.2)
CO2: 22 mmol/L (ref 20–29)
Calcium: 10.4 mg/dL — ABNORMAL HIGH (ref 8.7–10.2)
Chloride: 103 mmol/L (ref 96–106)
Creatinine, Ser: 0.83 mg/dL (ref 0.57–1.00)
GFR calc Af Amer: 109 mL/min/{1.73_m2} (ref >59)
GFR calc non Af Amer: 95 mL/min/{1.73_m2} (ref >59)
Globulin, Total: 2.7 g/dL (ref 1.5–4.5)
Glucose: 98 mg/dL (ref 65–99)
Potassium: 6.2 mmol/L — ABNORMAL HIGH (ref 3.5–5.2)
Sodium: 139 mmol/L (ref 134–144)
Total Protein: 7.6 g/dL (ref 6.0–8.5)

## 2019-11-24 LAB — CBC WITH DIFFERENTIAL/PLATELET
Basophils Absolute: 0.1 10*3/uL (ref 0.0–0.2)
Basos: 1 %
EOS (ABSOLUTE): 0.3 10*3/uL (ref 0.0–0.4)
Eos: 5 %
Hematocrit: 44.2 % (ref 34.0–46.6)
Hemoglobin: 14.2 g/dL (ref 11.1–15.9)
Immature Grans (Abs): 0 10*3/uL (ref 0.0–0.1)
Immature Granulocytes: 0 %
Lymphocytes Absolute: 1.9 10*3/uL (ref 0.7–3.1)
Lymphs: 29 %
MCH: 27.6 pg (ref 26.6–33.0)
MCHC: 32.1 g/dL (ref 31.5–35.7)
MCV: 86 fL (ref 79–97)
Monocytes Absolute: 0.3 10*3/uL (ref 0.1–0.9)
Monocytes: 5 %
Neutrophils Absolute: 3.8 10*3/uL (ref 1.4–7.0)
Neutrophils: 60 %
Platelets: 309 10*3/uL (ref 150–450)
RBC: 5.14 x10E6/uL (ref 3.77–5.28)
RDW: 13 % (ref 11.7–15.4)
WBC: 6.4 10*3/uL (ref 3.4–10.8)

## 2019-11-24 LAB — ANA W/REFLEX: Anti Nuclear Antibody (ANA): NEGATIVE

## 2019-11-24 LAB — C-REACTIVE PROTEIN: CRP: 2 mg/L (ref 0–10)

## 2019-11-24 LAB — SEDIMENTATION RATE: Sed Rate: 13 mm/hr (ref 0–32)

## 2019-11-24 LAB — TSH: TSH: 0.573 u[IU]/mL (ref 0.450–4.500)

## 2019-11-24 NOTE — Addendum Note (Signed)
Addended by: Kathrine Haddock on: 11/24/2019 09:32 AM   Modules accepted: Orders

## 2019-11-24 NOTE — Telephone Encounter (Signed)
Copied from [place] [phone]. Topic: General - Other >> Nov 24, 2019 10:43 AM Leward Quan A wrote: Reason for CRM: Patient called to say that she would like a call back when test results are ready also states that she need the referral for the sleep study by the end of this month but may not be able to do it because insurance will not cover it. States that her mom have a machine which she may be able to use would like to discuss more with Dr please call Ph# [phone]

## 2019-11-27 NOTE — Telephone Encounter (Signed)
Looks like Malachy Mood saw patient last and messaged with results, can you relay this message to patient from [employer] are normal except K and CA is high.  Can you come in today and have redrawn for just a lab visit? Order is entered".  Also looks like Malachy Mood entered a sleep study can we check on this please.  Thank you.

## 2019-11-28 NOTE — Telephone Encounter (Signed)
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 The order that was entered for the sleep study was not entered correctly. Can you resend in the referral for the patient to have a sleep study please?

## 2019-11-29 ENCOUNTER — Telehealth: Payer: Self-pay | Admitting: Family Medicine

## 2019-11-29 NOTE — Telephone Encounter (Signed)
Copied from [place] [id number]. Topic: General - Inquiry >> Nov 29, 2019 11:22 AM Gillis Ends D wrote: Reason for CRM: patient called and wants somebody to call her back and give her the test results and go over them with her. She sees them on my chart but wasn't sure what it meant and needs some clarity. When you call if no answer please leave a message with the results per patient. She's at work and may not be able to answer. Please advise

## 2019-11-29 NOTE — Telephone Encounter (Signed)
See other phone encounter on this.

## 2019-11-29 NOTE — Telephone Encounter (Signed)
Called and notified patient of results. States she will plan to come by in the morning for repeat labs. Patient states that for the sleep study, she would like to do an in home sleep study as she suspects this will be more affordable for her.

## 2019-11-30 NOTE — Telephone Encounter (Signed)
I am happy to order the home sleep study.  Can someone let me know who we are referring to for the home tests?  Also, please let me know how I ordered the sleep study incorrectly?

## 2019-12-01 ENCOUNTER — Other Ambulatory Visit: Payer: PRIVATE HEALTH INSURANCE

## 2019-12-01 ENCOUNTER — Other Ambulatory Visit: Payer: Self-pay

## 2019-12-01 DIAGNOSIS — E875 Hyperkalemia: Secondary | ICD-10-CM

## 2019-12-02 LAB — PTH, INTACT AND CALCIUM: PTH: 20 pg/mL (ref 15–65)

## 2019-12-02 LAB — COMPREHENSIVE METABOLIC PANEL
ALT: 10 IU/L (ref 0–32)
AST: 17 IU/L (ref 0–40)
Albumin/Globulin Ratio: 1.7 (ref 1.2–2.2)
Albumin: 4.4 g/dL (ref 3.9–5.0)
Alkaline Phosphatase: 57 IU/L (ref 48–121)
BUN/Creatinine Ratio: 9 (ref 9–23)
BUN: 9 mg/dL (ref 6–20)
Bilirubin Total: <0.2 mg/dL (ref 0.0–1.2)
CO2: 23 mmol/L (ref 20–29)
Calcium: 9.4 mg/dL (ref 8.7–10.2)
Chloride: 105 mmol/L (ref 96–106)
Creatinine, Ser: 0.98 mg/dL (ref 0.57–1.00)
GFR calc Af Amer: 90 mL/min/{1.73_m2} (ref >59)
GFR calc non Af Amer: 78 mL/min/{1.73_m2} (ref >59)
Globulin, Total: 2.6 g/dL (ref 1.5–4.5)
Glucose: 91 mg/dL (ref 65–99)
Potassium: 5.7 mmol/L — ABNORMAL HIGH (ref 3.5–5.2)
Sodium: 139 mmol/L (ref 134–144)
Total Protein: 7 g/dL (ref 6.0–8.5)

## 2019-12-04 ENCOUNTER — Other Ambulatory Visit: Payer: Self-pay | Admitting: Nurse Practitioner

## 2019-12-04 DIAGNOSIS — E875 Hyperkalemia: Secondary | ICD-10-CM

## 2019-12-04 NOTE — Progress Notes (Signed)
Contacted via MyChart  Good afternoon Melissa Mendez.  Your labs have returned: - PTH is within normal range and calcium has trended down to normal limit - Potassium remains slightly elevated, although improved from previous check.  Recommend heavy focus on limiting potassium rich foods in diet over next few weeks, these include bananas, sweet potatoes, black beans, white beans, watermelon, spinach, potatoes, butternut squash, beets, and even tomato paste.  Would like to recheck this level in a few weeks via outpatient labs to ensure it continues to trend down.  Also hold all supplements you may take and increase water intake.  Any questions?  Please schedule outpatient lab visit with front office staff.  Thank you:)

## 2019-12-04 NOTE — Telephone Encounter (Signed)
If you order it under referral sleep studies.  We refer to [facility] or [facility] locally.  Whoever insurance will cover.

## 2019-12-05 ENCOUNTER — Encounter: Payer: Self-pay | Admitting: Unknown Physician Specialty

## 2019-12-07 ENCOUNTER — Other Ambulatory Visit: Payer: Self-pay | Admitting: Unknown Physician Specialty

## 2019-12-07 DIAGNOSIS — R5382 Chronic fatigue, unspecified: Secondary | ICD-10-CM

## 2019-12-21 ENCOUNTER — Ambulatory Visit: Payer: PRIVATE HEALTH INSURANCE | Admitting: Unknown Physician Specialty

## 2019-12-21 ENCOUNTER — Encounter: Payer: Self-pay | Admitting: Unknown Physician Specialty

## 2019-12-21 ENCOUNTER — Telehealth (INDEPENDENT_AMBULATORY_CARE_PROVIDER_SITE_OTHER): Payer: PRIVATE HEALTH INSURANCE | Admitting: Unknown Physician Specialty

## 2019-12-21 DIAGNOSIS — E875 Hyperkalemia: Secondary | ICD-10-CM

## 2019-12-21 DIAGNOSIS — M797 Fibromyalgia: Secondary | ICD-10-CM | POA: Diagnosis not present

## 2019-12-21 DIAGNOSIS — F329 Major depressive disorder, single episode, unspecified: Secondary | ICD-10-CM

## 2019-12-21 DIAGNOSIS — F1290 Cannabis use, unspecified, uncomplicated: Secondary | ICD-10-CM | POA: Insufficient documentation

## 2019-12-21 DIAGNOSIS — F419 Anxiety disorder, unspecified: Secondary | ICD-10-CM

## 2019-12-21 MED ORDER — GABAPENTIN 800 MG PO TABS
800.0000 mg | ORAL_TABLET | Freq: Three times a day (TID) | ORAL | 1 refills | Status: DC
Start: 2019-12-21 — End: 2020-02-22

## 2019-12-21 NOTE — Assessment & Plan Note (Signed)
May be related to intermittent hyperemesis

## 2019-12-21 NOTE — Assessment & Plan Note (Addendum)
Increase Gabapentin to 800 mg TID.  Start Vit D3 at 2,000 IUs/day.  Awaiting sleep study

## 2019-12-21 NOTE — Assessment & Plan Note (Signed)
Mild improvement at 20-30%.  May not have achieved maximum improvement.  Will Continue Cymbalta at 60 mg and recheck in 4 week.

## 2019-12-21 NOTE — Progress Notes (Addendum)
LMP 01/02/2019    Subjective:    Patient ID: Melissa Mendez, female    DOB: Jun 05, 1990, 29 y.o.   MRN: [id number]  HPI: Melissa Mendez is a 29 y.o. female  Chief Complaint  Patient presents with  . Anxiety  . Depression  . Emesis    off and on for about a month   This visit was completed via telephone due to the restrictions of the COVID-19 pandemic. All issues as above were discussed and addressed but no physical exam was performed. If it was felt that the patient should be evaluated in the office, they were directed there. The patient verbally consented to this visit. Patient was unable to complete an audio/visual visit due to Lack of equipment. . Location of the patient: home . Location of the provider: work . Those involved with this call:  . Provider: Kathrine Haddock, DNP . CMA: Yvonna Alanis, CMA . Front Desk/Registration: Jill Side  . Time spent on call: 15 minutes on the phone discussing health concerns. 20 minutes total spent in review of patient's record and preparation of their chart.  I verified patient identity using two factors (patient name and date of birth). Patient consents verbally to being seen via telemedicine visit today.    Following up on starting Cymbalta last visit.  She is having trouble with unrestful sleep, fatigue, depression, and anxiety.  States she feels depression is 30% improved with Cymbalta. Unable to do a PHQ9 over the phone.   Titrated up to 60 mg for the last 2 weeks.  Sleep study is pending.  Fibromyalgia: Muscle pain and fatigue better but maybe because of job change. Planning on going back to work.     Continuing to have intermittent vomiting.  This is not new and ongoing.  Pt wondering if it is related to thyroid.  No change in BMs and no nausea.  Does smoke Marijuana on a daily basis.  Doesn't notice any relation.     Hyperkalemia: Denies SOB or palpitations.  Denies K rich diet.    Relevant past medical, surgical, family and  social history reviewed and updated as indicated. Interim medical history since our last visit reviewed. Allergies and medications reviewed and updated.  Review of Systems  Per HPI unless specifically indicated above     Objective:    LMP 01/02/2019   Wt Readings from Last 3 Encounters:  11/23/19 234 lb 3.2 oz (106.2 kg)  09/15/19 230 lb (104.3 kg)  08/17/19 231 lb (104.8 kg)    Physical Exam Neurological:     Mental Status: She is alert.  Psychiatric:        Mood and Affect: Mood normal.     Results for orders placed or performed in visit on 12/01/19  PTH, Intact and Calcium  Result Value Ref Range   PTH 20 15 - 65 pg/mL   PTH Interp Comment   Comprehensive metabolic panel  Result Value Ref Range   Glucose 91 65 - 99 mg/dL   BUN 9 6 - 20 mg/dL   Creatinine, Ser 0.98 0.57 - 1.00 mg/dL   GFR calc non Af Amer 78 >59 mL/min/1.73   GFR calc Af Amer 90 >59 mL/min/1.73   BUN/Creatinine Ratio 9 9 - 23   Sodium 139 134 - 144 mmol/L   Potassium 5.7 (H) 3.5 - 5.2 mmol/L   Chloride 105 96 - 106 mmol/L   CO2 23 20 - 29 mmol/L   Calcium 9.4 8.7 - 10.2  mg/dL   Total Protein 7.0 6.0 - 8.5 g/dL   Albumin 4.4 3.9 - 5.0 g/dL   Globulin, Total 2.6 1.5 - 4.5 g/dL   Albumin/Globulin Ratio 1.7 1.2 - 2.2   Bilirubin Total <0.2 0.0 - 1.2 mg/dL   Alkaline Phosphatase 57 48 - 121 IU/L   AST 17 0 - 40 IU/L   ALT 10 0 - 32 IU/L      Assessment & Plan:   Problem List Items Addressed This Visit      Unprioritized   Anxiety and depression    Mild improvement at 20-30%.  May not have achieved maximum improvement.  Will Continue Cymbalta at 60 mg and recheck in 4 week.        Fibromyalgia    Increase Gabapentin to 800 mg TID.  Start Vit D3 at 2,000 IUs/day.  Awaiting sleep study      Relevant Medications   gabapentin (NEURONTIN) 800 MG tablet   Marijuana smoker    May be related to intermittent hyperemesis       Other Visit Diagnoses    Hyperkalemia    -  Primary   Trending  down, but last visit it was 5.7.  Order entered to recheck   Relevant Orders   Basic Metabolic Panel (BMET)        Follow up plan: Return in about 4 weeks (around 01/18/2020).

## 2020-01-08 ENCOUNTER — Other Ambulatory Visit: Payer: Self-pay | Admitting: Family Medicine

## 2020-01-26 ENCOUNTER — Telehealth: Payer: Self-pay

## 2020-01-26 NOTE — Telephone Encounter (Signed)
Called pt to schedule lab appt, no answer, no vm

## 2020-01-26 NOTE — Telephone Encounter (Signed)
-----   Message from [place], [place] sent at 01/25/2020  4:33 PM EDT ----- Regarding: FW: BMP  ----- Message ----- From: Kathrine Haddock, NP Sent: 01/25/2020   8:10 AM EDT To: [facility] Subject: BMP                                            Pt needs BMP which she never came in for

## 2020-01-30 NOTE — Telephone Encounter (Signed)
Called pt no answer no vm 

## 2020-02-01 NOTE — Telephone Encounter (Signed)
Called pt no answer left vm 

## 2020-02-15 ENCOUNTER — Encounter: Payer: Self-pay | Admitting: Unknown Physician Specialty

## 2020-02-15 ENCOUNTER — Other Ambulatory Visit: Payer: Self-pay

## 2020-02-15 ENCOUNTER — Ambulatory Visit (INDEPENDENT_AMBULATORY_CARE_PROVIDER_SITE_OTHER): Payer: PRIVATE HEALTH INSURANCE | Admitting: Unknown Physician Specialty

## 2020-02-15 VITALS — BP 151/102 | HR 68 | Temp 98.1°F | Resp 16 | Wt 243.0 lb

## 2020-02-15 DIAGNOSIS — F419 Anxiety disorder, unspecified: Secondary | ICD-10-CM | POA: Diagnosis not present

## 2020-02-15 DIAGNOSIS — F32A Depression, unspecified: Secondary | ICD-10-CM

## 2020-02-15 DIAGNOSIS — K121 Other forms of stomatitis: Secondary | ICD-10-CM | POA: Diagnosis not present

## 2020-02-15 DIAGNOSIS — Z7251 High risk heterosexual behavior: Secondary | ICD-10-CM | POA: Diagnosis not present

## 2020-02-15 MED ORDER — VALACYCLOVIR HCL 1 G PO TABS
2000.0000 mg | ORAL_TABLET | Freq: Two times a day (BID) | ORAL | 0 refills | Status: AC
Start: 2020-02-15 — End: 2020-02-17

## 2020-02-15 NOTE — Progress Notes (Signed)
BP (!) 151/102   Pulse 68   Temp 98.1 F (36.7 C) (Oral)   Resp 16   Wt 243 lb (110.2 kg)   LMP 02/07/2020 (Exact Date)   SpO2 96%   BMI 37.50 kg/m    Subjective:    Patient ID: Melissa Mendez, female    DOB: [birth year], 29 y.o.   MRN: [id number]  HPI: Melissa Mendez is a 29 y.o. female  Chief Complaint  Patient presents with   . Anxiety    Presents with 4 week f/u. Denies anxious mood. Verbalizes symptoms are improved. Using medications without difficulty.   . Depression    Patient returns to clinic today for 4 week follow up last office visit was 12/21/19, at last visit patient report mild symptom relief and was advised to continue Cymbalta 60mg . Patient reports good compliance and symptom control on medication. Patient denies depressed mood or difficulty concentrating, she does report symptoms still of fatigue.  . Exposure to STD    Patient would like to be screened for sexualy transmitted disease she states that she was with a new partner a month ago. Patient reports finding two bumps near her labia she describes them as painful and denies that it has grown in size  . Oral Pain    Patent reports white film on the inner of her lips that has been present for over one year but she states that it keeps reappearing and describes it as a burning sensation, she describes skin as peeling.                              Depression screen [facility] 2/9 02/15/2020 12/21/2019 11/23/2019 08/17/2019  Decreased Interest 3 2 2 1   Down, Depressed, Hopeless 1 2 2 1   PHQ - 2 Score 4 4 4 2   Altered sleeping 3 2 2 2   Tired, decreased energy 3 2 2 2   Change in appetite 0 1 2 2   Feeling bad or failure about yourself  1 1 2  0  Trouble concentrating 2 2 - 0  Moving slowly or fidgety/restless 0 0 1 0  Suicidal thoughts 0 1 1 1   PHQ-9 Score 13 13 14 9   Difficult doing work/chores Somewhat difficult Somewhat difficult - -   GAD 7 : Generalized Anxiety Score 02/15/2020 12/21/2019 08/17/2019  Nervous,  Anxious, on Edge 1 0 1  Control/stop worrying 1 1 0  Worry too much - different things 0 1 0  Trouble relaxing 0 0 0  Restless 0 0 0  Easily annoyed or irritable 1 1 0  Afraid - awful might happen 0 0 0  Total GAD 7 Score 3 3 1   Anxiety Difficulty Somewhat difficult Somewhat difficult -    Relevant past medical, surgical, family and social history reviewed and updated as indicated. Interim medical history since our last visit reviewed. Allergies and medications reviewed and updated.  Review of Systems  Per HPI unless specifically indicated above     Objective:    BP (!) 151/102   Pulse 68   Temp 98.1 F (36.7 C) (Oral)   Resp 16   Wt 243 lb (110.2 kg)   LMP 02/07/2020 (Exact Date)   SpO2 96%   BMI 37.50 kg/m   Wt Readings from Last 3 Encounters:  02/15/20 243 lb (110.2 kg)  11/23/19 234 lb 3.2 oz (106.2 kg)  09/15/19 230 lb (104.3 kg)    Physical Exam  Constitutional:      General: She is not in acute distress.    Appearance: Normal appearance. She is well-developed.  HENT:     Head: Normocephalic and atraumatic.  Eyes:     General: Lids are normal. No scleral icterus.       Right eye: No discharge.        Left eye: No discharge.     Conjunctiva/sclera: Conjunctivae normal.  Neck:     Vascular: No carotid bruit or JVD.  Cardiovascular:     Rate and Rhythm: Normal rate and regular rhythm.     Heart sounds: Normal heart sounds.  Pulmonary:     Effort: Pulmonary effort is normal.     Breath sounds: Normal breath sounds.  Abdominal:     Palpations: There is no hepatomegaly or splenomegaly.  Genitourinary:    Comments: Lower labia with healing blister type lesions Musculoskeletal:        General: Normal range of motion.     Cervical back: Normal range of motion and neck supple.  Skin:    General: Skin is warm and dry.     Coloration: Skin is not pale.     Findings: No rash.  Neurological:     Mental Status: She is alert and oriented to person, place, and  time.  Psychiatric:        Behavior: Behavior normal.        Thought Content: Thought content normal.        Judgment: Judgment normal.     Results for orders placed or performed in visit on 12/01/19  PTH, Intact and Calcium  Result Value Ref Range   PTH 20 15 - 65 pg/mL   PTH Interp Comment   Comprehensive metabolic panel  Result Value Ref Range   Glucose 91 65 - 99 mg/dL   BUN 9 6 - 20 mg/dL   Creatinine, Ser 0.98 0.57 - 1.00 mg/dL   GFR calc non Af Amer 78 >59 mL/min/1.73   GFR calc Af Amer 90 >59 mL/min/1.73   BUN/Creatinine Ratio 9 9 - 23   Sodium 139 134 - 144 mmol/L   Potassium 5.7 (H) 3.5 - 5.2 mmol/L   Chloride 105 96 - 106 mmol/L   CO2 23 20 - 29 mmol/L   Calcium 9.4 8.7 - 10.2 mg/dL   Total Protein 7.0 6.0 - 8.5 g/dL   Albumin 4.4 3.9 - 5.0 g/dL   Globulin, Total 2.6 1.5 - 4.5 g/dL   Albumin/Globulin Ratio 1.7 1.2 - 2.2   Bilirubin Total <0.2 0.0 - 1.2 mg/dL   Alkaline Phosphatase 57 48 - 121 IU/L   AST 17 0 - 40 IU/L   ALT 10 0 - 32 IU/L      Assessment & Plan:   Problem List Items Addressed This Visit      Unprioritized   Anxiety and depression    PHQ9 & GAD score are same from last visit. Patient states she has improved since last visit. Will continue Cymbalta 60mg . RTC in 3 mths.        Other Visit Diagnoses    High risk heterosexual behavior    -  Primary   STD check inclucing HSV I/II   Relevant Orders   HSV(herpes simplex vrs) 1+2 ab-IgG   RPR   C. trachomatis/N. gonorrhoeae RNA   HIV Antibody (routine testing w rflx)   Oral ulcer       Suspect HSV I acitivated due to stress. Antiviral of  2 grams BID for 2 days with refills prn       Follow up plan: Return in about 3 months (around 05/17/2020) for Anxiety/Depression.

## 2020-02-15 NOTE — Assessment & Plan Note (Signed)
PHQ9 & GAD score are same from last visit. Patient states she has improved since last visit. Will continue Cymbalta 60mg . RTC in 3 mths.

## 2020-02-17 LAB — HIV ANTIBODY (ROUTINE TESTING W REFLEX): HIV Screen 4th Generation wRfx: NONREACTIVE

## 2020-02-17 LAB — RPR: RPR Ser Ql: NONREACTIVE

## 2020-02-17 LAB — HSV(HERPES SIMPLEX VRS) I + II AB-IGG
HSV 1 Glycoprotein G Ab, IgG: <0.91 index (ref 0.00–0.90)
HSV 2 IgG, Type Spec: 11.8 index — ABNORMAL HIGH (ref 0.00–0.90)

## 2020-02-21 ENCOUNTER — Telehealth: Payer: Self-pay

## 2020-02-21 NOTE — Telephone Encounter (Signed)
Called pt she would like to discuss treatment options with Malachy Mood. She states that she was informed before but wanted to be clear since she was with her father and wasn't able to provide her full attention.    Copied from [place] [phone]. Topic: Quick Communication - See Telephone Encounter >> Feb 21, 2020 12:03 PM Loma Boston wrote: CRM for notification. See Telephone encounter for: 02/21/20. Pt wants a cb re last wk appt with Malachy Mood. FU @ [phone]

## 2020-02-22 ENCOUNTER — Other Ambulatory Visit: Payer: Self-pay | Admitting: Unknown Physician Specialty

## 2020-03-13 NOTE — Telephone Encounter (Signed)
Tried calling back and no answer

## 2020-03-20 ENCOUNTER — Other Ambulatory Visit: Payer: Self-pay | Admitting: Family Medicine

## 2020-04-15 ENCOUNTER — Telehealth: Payer: Self-pay

## 2020-04-15 ENCOUNTER — Other Ambulatory Visit: Payer: Self-pay | Admitting: Nurse Practitioner

## 2020-04-15 MED ORDER — VALACYCLOVIR HCL 500 MG PO TABS: 500.0000 mg | ORAL_TABLET | Freq: Every day | ORAL | 4 refills | Status: DC

## 2020-04-15 NOTE — Telephone Encounter (Signed)
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 I did schedule her for 04/16/2020 incase apt was needed.Please call If medication can be sent in per [facility] NP lab response note.

## 2020-04-15 NOTE — Telephone Encounter (Signed)
Routing to provider to advise.  

## 2020-04-15 NOTE — Telephone Encounter (Signed)
Appointment is not necessary, please cancel

## 2020-04-15 NOTE — Telephone Encounter (Signed)
Have sent in Valtrex suppressive therapy for her.

## 2020-04-16 ENCOUNTER — Ambulatory Visit: Payer: PRIVATE HEALTH INSURANCE | Admitting: Family Medicine

## 2020-05-09 ENCOUNTER — Ambulatory Visit: Payer: PRIVATE HEALTH INSURANCE | Admitting: Family Medicine

## 2020-05-09 NOTE — Progress Notes (Deleted)
    SUBJECTIVE:   CHIEF COMPLAINT / HPI:   Patient Active Problem List   Diagnosis Date Noted  . Marijuana smoker 12/21/2019  . Epigastric abdominal pain 09/15/2019  . Supervision of high risk pregnancy, antepartum 03/16/2019  . Unsure of LMP (last menstrual period) as reason for ultrasound scan 03/16/2019  . 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 Fibromyalgia 09/03/2014  . Lumbago 09/03/2014  . Migraines 09/03/2014  . Anxiety and depression 09/03/2014   ABDOMINAL ISSUES - previously with h/o epigastric pain, referred to GI***  Duration: {Blank single:19197::"chronic","days","weeks","months"} Nature: {Blank multiple:19196::"bloating","sharp","dull","aching","burning","cramping","ill-defined","itchy","pressure-like","pulling","shooting","sore","stabbing","tender","tearing","throbbing"} Location: {Blank multiple:19196::"diffuse","vague","LUQ","RUQ","epigastric","peri-umbilical","LLQ","RLQ","suprapubic". "lower abdominal quadrants"}  Severity: {Blank single:19197::"mild","moderate","severe","1/10","2/10","3/10","4/10","5/10","6/10","7/10","8/10","9/10","10/10"}  Radiation: {Blank single:19197::"yes","no"} Episode duration: Frequency: {Blank single:19197::"constant","intermittent","occasional","rare","every few minutes","a few times a hour","a few times a day","a few times a week","a few times a month","a few times a year"} Alleviating factors: Aggravating factors: Treatments attempted: {Blank multiple:19196::"none","antacids","PPI","H2 Blocker","laxatives"} Constipation: {Blank single:19197::"yes","no","intermittent"} Diarrhea: {Blank single:19197::"yes","no"} Episodes of diarrhea/day: Mucous in the stool: {Blank single:19197::"yes","no"} Heartburn: {Blank single:19197::"yes","no"} Bloating:{Blank single:19197::"yes","no"} Flatulence: {Blank single:19197::"yes","no"} Nausea: {Blank single:19197::"yes","no"} Vomiting: {Blank single:19197::"yes","no"} Episodes of  vomit/day: Melena or hematochezia: {Blank single:19197::"yes","no"} Rash: {Blank single:19197::"yes","no"} Jaundice: {Blank single:19197::"yes","no"} Fever: {Blank single:19197::"yes","no"} Weight loss: {Blank single:19197::"yes","no"}    OBJECTIVE:   There were no vitals taken for this visit.  ***  ASSESSMENT/PLAN:   No problem-specific Assessment & Plan notes found for this encounter.     Myles Gip, DO [place]

## 2020-05-24 ENCOUNTER — Ambulatory Visit: Payer: PRIVATE HEALTH INSURANCE | Admitting: Nurse Practitioner

## 2020-06-07 NOTE — Progress Notes (Deleted)
   There were no vitals taken for this visit.   Subjective:    Patient ID: Melissa Mendez, female    DOB: Oct 22, 1990, 30 y.o.   MRN: [id number]  HPI: Melissa Mendez is a 30 y.o. female  No chief complaint on file.  DEPRESSION AND ANXIETY  FIBROMYALGIA Pain status: {Blank single:19197::"controlled","uncontrolled","better","worse","exacerbated","stable"} Satisfied with current treatment?: {Blank single:19197::"yes","no"} Medication side effects: {Blank single:19197::"yes","no"} Medication compliance: {Blank single:19197::"excellent compliance","good compliance","fair compliance","poor compliance"} Duration:  Location:  Quality: {Blank multiple:19196::"sharp","dull","aching","burning","cramping","ill-defined","itchy","pressure-like","pulling","shooting","sore","stabbing","tender","tearing","throbbing"} Current pain level: {Blank single:19197::"mild","moderate","severe","1/10","2/10","3/10","4/10","5/10","6/10","7/10","8/10","9/10","10/10"} Previous pain level: {Blank single:19197::"mild","moderate","severe","1/10","2/10","3/10","4/10","5/10","6/10","7/10","8/10","9/10","10/10"} Aggravating factors: {Blank multiple:19196::"none","lifting","movement","walking","laying","bending","prolonged sitting","coughing","valsalva","Pain increased with coughing/valsalva"} Alleviating factors: {Blank multiple:19196::"nothing","rest","ice","heat","laying","NSAIDs","APAP","narcotics","muscle relaxer"} Previous pain specialty evaluation: {Blank single:19197::"yes","no"} Non-narcotic analgesic meds: {Blank single:19197::"yes","no"} Narcotic contract:{Blank single:19197::"yes","no"} Treatments attempted: {Blank multiple:19196::"none","rest","ice","heat","APAP","ibuprofen","aleve","physical therapy","HEP","OMM"}    Relevant past medical, surgical, family and social history reviewed and updated as indicated. Interim medical history since our last visit reviewed. Allergies and medications reviewed and  updated.  Review of Systems  Per HPI unless specifically indicated above     Objective:    There were no vitals taken for this visit.  Wt Readings from Last 3 Encounters:  02/15/20 243 lb (110.2 kg)  11/23/19 234 lb 3.2 oz (106.2 kg)  09/15/19 230 lb (104.3 kg)    Physical Exam  Results for orders placed or performed in visit on 02/15/20  HSV(herpes simplex vrs) 1+2 ab-IgG  Result Value Ref Range   HSV 1 Glycoprotein G Ab, IgG <0.91 0.00 - 0.90 index   HSV 2 IgG, Type Spec 11.80 (H) 0.00 - 0.90 index  RPR  Result Value Ref Range   RPR Ser Ql Non Reactive Non Reactive  HIV Antibody (routine testing w rflx)  Result Value Ref Range   HIV Screen 4th Generation wRfx Non Reactive Non Reactive      Assessment & Plan:   Problem List Items Addressed This Visit   None      Follow up plan: No follow-ups on file.

## 2020-06-10 ENCOUNTER — Telehealth (INDEPENDENT_AMBULATORY_CARE_PROVIDER_SITE_OTHER): Payer: Medicaid Other | Admitting: Nurse Practitioner

## 2020-06-10 ENCOUNTER — Encounter: Payer: Self-pay | Admitting: Nurse Practitioner

## 2020-06-10 ENCOUNTER — Other Ambulatory Visit: Payer: Self-pay

## 2020-06-10 ENCOUNTER — Ambulatory Visit: Payer: PRIVATE HEALTH INSURANCE | Admitting: Nurse Practitioner

## 2020-06-10 DIAGNOSIS — K219 Gastro-esophageal reflux disease without esophagitis: Secondary | ICD-10-CM

## 2020-06-10 DIAGNOSIS — M797 Fibromyalgia: Secondary | ICD-10-CM

## 2020-06-10 DIAGNOSIS — F32A Depression, unspecified: Secondary | ICD-10-CM | POA: Diagnosis not present

## 2020-06-10 DIAGNOSIS — F419 Anxiety disorder, unspecified: Secondary | ICD-10-CM | POA: Diagnosis not present

## 2020-06-10 MED ORDER — GABAPENTIN 800 MG PO TABS: ORAL_TABLET | ORAL | 1 refills | Status: DC

## 2020-06-10 MED ORDER — ESCITALOPRAM OXALATE 10 MG PO TABS: 10.0000 mg | ORAL_TABLET | Freq: Every day | ORAL | 0 refills | Status: DC

## 2020-06-10 NOTE — Assessment & Plan Note (Addendum)
Resume Duloxetine.  After resuming Duloxetine for 1 week, recommend beginning Lexapro daily to help with depression and anxiety.  Follow up in one month.

## 2020-06-10 NOTE — Progress Notes (Addendum)
There were no vitals taken for this visit.   Subjective:    Patient ID: Melissa Mendez, female    DOB: 12/23/1990, 30 y.o.   MRN: [id number]  HPI: Melissa Mendez is a 30 y.o. female  Chief Complaint  Patient presents with  . Anxiety  . Referral    Vomiting multiple times a week, has trouble swallowing.    GI CONCERNS Patient states that she feels like she has food stuck in her esophagus.  Patient also throws up 2-3 times a week.  Patient would like a referral to GI.  She has not been taking the protonix due to her insurance issues.  DEPRESSION AND ANXIETY Patient was having trouble with her insurance and unable to get her medications.  She was out of the medication for 1-2 months.  Patient has not restarted her Duloxetine.  Patient has taken Lexapro in the past.   Denies SI/HI.  Patient states she is constantly worrying.  FIBROMYALGIA Patient has been experiencing a lot of pain.  She has been out of her medication. 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   Relevant past medical, surgical, family and social history reviewed and updated as indicated. Interim medical history since our last visit reviewed. Allergies and medications reviewed and updated.  Review of Systems  Gastrointestinal: Positive for vomiting.       Feels like something is stuck in esophagus. Heart burn.  Musculoskeletal: Positive for myalgias.  Psychiatric/Behavioral: Positive for dysphoric mood. The patient is nervous/anxious.     Per HPI unless specifically indicated above     Objective:    There were no vitals taken for this visit.  Wt Readings from Last 3 Encounters:  02/15/20 243 lb (110.2 kg)  11/23/19 234 lb 3.2 oz (106.2 kg)  09/15/19 230 lb (104.3 kg)    Physical Exam Vitals and nursing note reviewed.  Pulmonary:     Effort: Pulmonary effort is normal. No respiratory distress.  Neurological:     Mental Status: She is alert.  Psychiatric:        Mood  and Affect: Mood normal.        Behavior: Behavior normal.        Thought Content: Thought content normal.        Judgment: Judgment normal.     Results for orders placed or performed in visit on 02/15/20  HSV(herpes simplex vrs) 1+2 ab-IgG  Result Value Ref Range   HSV 1 Glycoprotein G Ab, IgG <0.91 0.00 - 0.90 index   HSV 2 IgG, Type Spec 11.80 (H) 0.00 - 0.90 index  RPR  Result Value Ref Range   RPR Ser Ql Non Reactive Non Reactive  HIV Antibody (routine testing w rflx)  Result Value Ref Range   HIV Screen 4th Generation wRfx Non Reactive Non Reactive      Assessment & Plan:   Problem List Items Addressed This Visit      Other   Fibromyalgia    Resume taking the Gabapentin.  Recommend spreading out the doses to take last dose at the end of her work shift or once she gets home to help with pain.      Relevant Medications   escitalopram (LEXAPRO) 10 MG tablet   gabapentin (NEURONTIN) 800 MG tablet   Anxiety and depression - Primary    Resume Duloxetine.  After resuming Duloxetine for 1 week, recommend beginning Lexapro daily to help with  depression and anxiety.  Follow up in one month.      Relevant Medications   escitalopram (LEXAPRO) 10 MG tablet   gabapentin (NEURONTIN) 800 MG tablet    Other Visit Diagnoses    Gastroesophageal reflux disease, unspecified whether esophagitis present       Referral placed for GI.  Recommend patient take the Protonix to help with reflux.   Relevant Orders   Ambulatory referral to Gastroenterology       Follow up plan: Return in about 1 month (around 07/08/2020) for Depression/Anxiety FU.    This visit was completed via MyChart due to the restrictions of the COVID-19 pandemic. All issues as above were discussed and addressed. Physical exam was done as above through visual confirmation on MyChart. If it was felt that the patient should be evaluated in the office, they were directed there. The patient verbally consented to this  visit. 1. Location of the patient: Work 2. Location of the provider: Office 3. Those involved with this call:  ? Provider: Jon Billings, NP ? CMA: Tiffany Reel, CMA ? Front Desk/Registration: Jill Side 4. Time spent on call: 21 minutes with patient  via phone conference. More than 50% of this time was spent in counseling and coordination of care. 30 minutes total spent in review of patient's record and preparation of their chart.

## 2020-06-10 NOTE — Assessment & Plan Note (Signed)
Resume taking the Gabapentin.  Recommend spreading out the doses to take last dose at the end of her work shift or once she gets home to help with pain.

## 2020-06-14 ENCOUNTER — Telehealth: Payer: Self-pay

## 2020-06-14 NOTE — Telephone Encounter (Signed)
Patient notified

## 2020-06-14 NOTE — Telephone Encounter (Signed)
Called and spoke with patient, she states that she would like a note explaining that's he has fibromyalgia and gi issues that she is dealing with. They are wanting her to work open to close 5 days in a row and she states that it is way to hard on her body with the fibromyalgia to work open to close, she usually has Friday afternoon/ night off to rest and they are wanting her to work tonight as well, she usually rests on Friday night

## 2020-06-14 NOTE — Telephone Encounter (Signed)
Copied from [place] [phone]. Topic: General - Other >> Jun 14, 2020  9:51 AM Leward Quan A wrote: Reason for CRM: Patient called in to request a letter for her employer that would help her with getting limitations on her job. Asking for a call back today Please call Ph# [phone]

## 2020-06-14 NOTE — Telephone Encounter (Signed)
Letter sent to patient via mychart. Will you please let her know.

## 2020-06-25 ENCOUNTER — Encounter: Payer: Self-pay | Admitting: *Deleted

## 2020-07-08 ENCOUNTER — Encounter: Payer: Self-pay | Admitting: Nurse Practitioner

## 2020-07-08 ENCOUNTER — Other Ambulatory Visit: Payer: Self-pay

## 2020-07-08 ENCOUNTER — Ambulatory Visit (INDEPENDENT_AMBULATORY_CARE_PROVIDER_SITE_OTHER): Payer: Medicaid Other | Admitting: Nurse Practitioner

## 2020-07-08 VITALS — BP 124/88 | HR 76 | Temp 98.6°F | Wt 278.1 lb

## 2020-07-08 DIAGNOSIS — F419 Anxiety disorder, unspecified: Secondary | ICD-10-CM

## 2020-07-08 DIAGNOSIS — F32A Depression, unspecified: Secondary | ICD-10-CM | POA: Diagnosis not present

## 2020-07-08 DIAGNOSIS — M797 Fibromyalgia: Secondary | ICD-10-CM | POA: Diagnosis not present

## 2020-07-08 DIAGNOSIS — R5382 Chronic fatigue, unspecified: Secondary | ICD-10-CM

## 2020-07-08 NOTE — Progress Notes (Signed)
BP 124/88   Pulse 76   Temp 98.6 F (37 C)   Wt 278 lb 2 oz (126.2 kg)   SpO2 97%   BMI 42.92 kg/m    Subjective:    Patient ID: Melissa Mendez, female    DOB: [birth year], 30 y.o.   MRN: [id number]  HPI: Melissa Mendez is a 30 y.o. female  Chief Complaint  Patient presents with  . Anxiety  . Depression  . Facial Swelling    When she eats, right side of face  . ADD   GI CONCERNS Patient states that she feels like she has food stuck in her esophagus.  Patient also throws up 2-3 times a week.  Patient would like a referral to GI.  Patient has resumed taking her Protonix   DEPRESSION AND ANXIETY Patient was having trouble with her insurance and unable to get her medications.  She was out of the medication for 1-2 months.  Patient has not restarted her Duloxetine.  Patient has taken Lexapro in the past.   Denies SI/HI.  Patient states she is constantly worrying.  Patient overall just hasn't been feeling well.  Has gained weight and concerned she has a thyroid problem.   FIBROMYALGIA Patient has been experiencing a lot of pain.  She has been out of her medication. 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 However, she does feel like the current medication regimen is helping to manage her symptoms.    Relevant past medical, surgical, family and social history reviewed and updated as indicated. Interim medical history since our last visit reviewed.  Allergies and medications reviewed and updated.  Review of Systems  Gastrointestinal: Positive for vomiting.       Feels like something is stuck in esophagus. Heart burn.  Musculoskeletal: Positive for myalgias.  Psychiatric/Behavioral: Positive for dysphoric mood. The patient is nervous/anxious.     Per HPI unless specifically indicated above     Objective:    BP 124/88   Pulse 76   Temp 98.6 F (37 C)   Wt 278 lb 2 oz (126.2 kg)   SpO2 97%   BMI 42.92 kg/m   Wt Readings from Last  3 Encounters:  07/08/20 278 lb 2 oz (126.2 kg)  02/15/20 243 lb (110.2 kg)  11/23/19 234 lb 3.2 oz (106.2 kg)    Physical Exam Vitals and nursing note reviewed.  Constitutional:      General: She is not in acute distress.    Appearance: Normal appearance. She is normal weight. She is not ill-appearing, toxic-appearing or diaphoretic.  HENT:     Head: Normocephalic.     Right Ear: External ear normal.     Left Ear: External ear normal.     Nose: Nose normal.     Mouth/Throat:     Mouth: Mucous membranes are moist.     Pharynx: Oropharynx is clear.  Eyes:     General:        Right eye: No discharge.        Left eye: No discharge.     Extraocular Movements: Extraocular movements intact.     Conjunctiva/sclera: Conjunctivae normal.     Pupils: Pupils are equal, round, and reactive to light.  Cardiovascular:     Rate and Rhythm: Normal rate and regular rhythm.     Heart sounds: No murmur heard.   Pulmonary:     Effort: Pulmonary effort is normal.  No respiratory distress.     Breath sounds: Normal breath sounds. No wheezing or rales.  Musculoskeletal:     Cervical back: Normal range of motion and neck supple.  Skin:    General: Skin is warm and dry.     Capillary Refill: Capillary refill takes less than 2 seconds.  Neurological:     General: No focal deficit present.     Mental Status: She is alert and oriented to person, place, and time. Mental status is at baseline.  Psychiatric:        Mood and Affect: Mood normal.        Behavior: Behavior normal.        Thought Content: Thought content normal.        Judgment: Judgment normal.     Results for orders placed or performed in visit on 02/15/20  HSV(herpes simplex vrs) 1+2 ab-IgG  Result Value Ref Range   HSV 1 Glycoprotein G Ab, IgG <0.91 0.00 - 0.90 index   HSV 2 IgG, Type Spec 11.80 (H) 0.00 - 0.90 index  RPR  Result Value Ref Range   RPR Ser Ql Non Reactive Non Reactive  HIV Antibody (routine testing w rflx)   Result Value Ref Range   HIV Screen 4th Generation wRfx Non Reactive Non Reactive      Assessment & Plan:   Problem List Items Addressed This Visit      Other   Fibromyalgia    Chronic.  Ongoing.  Continue with current medication regimen.  Return to clinic if symptoms worsen.       Anxiety and depression - Primary    Chronic.  Uncontrolled.  Recommend patient see psychiatry due to ongoing depression and anxiety. Patient has recently started taking the Lexapro after giving the Duloxetine time work.  Patient would also like to be evaluated for ADD.  She took Adderrall as a child but feels like she would benefit from it now.  Due to ongoing depression and anxiety as well as wanting to be evaluated, I think it would be best if patient see's psychiatry for further evaluation and treatment. Continue with current regimen until able to see specialist.       Relevant Orders   Ambulatory referral to Psychiatry    Other Visit Diagnoses    Chronic fatigue       Will make recommendations based on lab results.    Relevant Orders   TSH   T4       Follow up plan: Return in about 2 months (around 09/07/2020) for Depression/Anxiety FU.

## 2020-07-08 NOTE — Assessment & Plan Note (Signed)
Chronic.  Uncontrolled.  Recommend patient see psychiatry due to ongoing depression and anxiety. Patient has recently started taking the Lexapro after giving the Duloxetine time work.  Patient would also like to be evaluated for ADD.  She took Adderrall as a child but feels like she would benefit from it now.  Due to ongoing depression and anxiety as well as wanting to be evaluated, I think it would be best if patient see's psychiatry for further evaluation and treatment. Continue with current regimen until able to see specialist.

## 2020-07-08 NOTE — Assessment & Plan Note (Signed)
Chronic.  Ongoing.  Continue with current medication regimen.  Return to clinic if symptoms worsen.

## 2020-07-09 LAB — T4: T4, Total: 7.3 ug/dL (ref 4.5–12.0)

## 2020-07-09 LAB — TSH: TSH: 1.26 u[IU]/mL (ref 0.450–4.500)

## 2020-07-09 NOTE — Progress Notes (Signed)
Hi Chani.  Your thyroid labs are within normal limits.  Follow up as discussed.

## 2020-07-15 ENCOUNTER — Other Ambulatory Visit: Payer: Self-pay | Admitting: Unknown Physician Specialty

## 2020-07-18 ENCOUNTER — Ambulatory Visit: Payer: Self-pay | Admitting: Gastroenterology

## 2020-07-18 ENCOUNTER — Encounter: Payer: Self-pay | Admitting: Gastroenterology

## 2020-07-25 ENCOUNTER — Telehealth: Payer: Self-pay

## 2020-07-25 NOTE — Telephone Encounter (Signed)
Copied from [place] [phone]. Topic: Referral - Status >> Jul 25, 2020 11:36 AM Alanda Slim E wrote: Reason for CRM: Pt called and stated the place she was referred to is not accepting new pts and they referred her to [facility] / They need a referral to see her but they wont be able to get her in until late May, possibly June/ Pt asked if there is a place that will see her sooner/ if so she'd like a referral there but f not she will like the referral to [facility] / please advise

## 2020-07-25 NOTE — Telephone Encounter (Signed)
Called and left a voicemail to let patient know that she should stay with [facility] because of insurance.

## 2020-07-30 ENCOUNTER — Other Ambulatory Visit: Payer: Self-pay | Admitting: Unknown Physician Specialty

## 2020-07-30 ENCOUNTER — Other Ambulatory Visit: Payer: Self-pay | Admitting: Nurse Practitioner

## 2020-07-30 DIAGNOSIS — M797 Fibromyalgia: Secondary | ICD-10-CM

## 2020-07-30 DIAGNOSIS — F419 Anxiety disorder, unspecified: Secondary | ICD-10-CM

## 2020-07-30 DIAGNOSIS — F32A Depression, unspecified: Secondary | ICD-10-CM

## 2020-08-03 ENCOUNTER — Other Ambulatory Visit: Payer: Self-pay | Admitting: Unknown Physician Specialty

## 2020-08-03 DIAGNOSIS — F419 Anxiety disorder, unspecified: Secondary | ICD-10-CM

## 2020-08-03 DIAGNOSIS — M797 Fibromyalgia: Secondary | ICD-10-CM

## 2020-08-16 ENCOUNTER — Telehealth: Payer: Self-pay | Admitting: Nurse Practitioner

## 2020-08-16 MED ORDER — ONDANSETRON HCL 4 MG PO TABS: 4.0000 mg | ORAL_TABLET | Freq: Three times a day (TID) | ORAL | 0 refills | Status: DC | PRN

## 2020-08-16 NOTE — Telephone Encounter (Signed)
Medication sent to the pharmacy.

## 2020-08-16 NOTE — Telephone Encounter (Signed)
Patient aware of rx sent to pharmacy.

## 2020-08-16 NOTE — Telephone Encounter (Signed)
Pt has an appt with GI specialist as new pt on 08-22-2020. Pt has been having nausea and would like to know if karen will prescribe some nausea medication until she can see GI specialist . [facility] in [place] [address] phone number [phone]

## 2020-08-20 NOTE — Telephone Encounter (Signed)
Pt is requesting for a referral to be sent to [facility]  Please advise [facility] [phone]

## 2020-08-21 NOTE — Telephone Encounter (Signed)
Referral has already been placed.

## 2020-08-22 ENCOUNTER — Encounter: Payer: Self-pay | Admitting: Gastroenterology

## 2020-08-22 ENCOUNTER — Ambulatory Visit (INDEPENDENT_AMBULATORY_CARE_PROVIDER_SITE_OTHER): Payer: Medicaid Other | Admitting: Gastroenterology

## 2020-08-22 ENCOUNTER — Other Ambulatory Visit: Payer: Self-pay

## 2020-08-22 VITALS — BP 149/92 | HR 86 | Temp 97.7°F | Ht 68.0 in | Wt 294.8 lb

## 2020-08-22 DIAGNOSIS — R112 Nausea with vomiting, unspecified: Secondary | ICD-10-CM | POA: Diagnosis not present

## 2020-08-22 DIAGNOSIS — K219 Gastro-esophageal reflux disease without esophagitis: Secondary | ICD-10-CM

## 2020-08-22 DIAGNOSIS — R110 Nausea: Secondary | ICD-10-CM

## 2020-08-22 MED ORDER — LINACLOTIDE 290 MCG PO CAPS: 290.0000 ug | ORAL_CAPSULE | Freq: Every day | ORAL | 1 refills | Status: DC

## 2020-08-22 NOTE — Progress Notes (Signed)
Melissa Mendez [address]  [place]  [place], [place] [address]  Main: [phone]  Fax: [phone]   Gastroenterology Consultation  Referring Provider:     Jon Billings, NP Primary Care Physician:  Jon Billings, NP Reason for Consultation:     GERD        HPI:    Chief Complaint  Patient presents with  . Gastroesophageal Reflux    Melissa Mendez is a 30 y.o. y/o female referred for consultation & management  by Dr. Jon Billings, NP.  Patient reports 1 year history of nausea vomiting, almost daily.  No dysphagia.  Reports weight gain but not weight loss.  Does smoke marijuana, but has been doing this for years.  Also had heartburn and with PPI, this has improved, but nausea vomiting has not.  Patient is frustrated with her symptoms, it is affecting her job quality of life.  No family history of GI malignancy.  No prior EGD or colonoscopy.  Past Medical History:  Diagnosis Date  . Anxiety   . Depression   . Fibromyalgia   . Fibromyalgia   . Migraines   . Obesity     Past Surgical History:  Procedure Laterality Date  . TONSILLECTOMY AND ADENOIDECTOMY  1993  . WISDOM TOOTH EXTRACTION      Prior to Admission medications   Medication Sig Start Date End Date Taking? Authorizing Provider  DULoxetine (CYMBALTA) 30 MG capsule TAKE 2 CAPSULES(60 MG) BY MOUTH DAILY 07/15/20  Yes Jon Billings, NP  escitalopram (LEXAPRO) 10 MG tablet TAKE 1 TABLET(10 MG) BY MOUTH DAILY 07/30/20  Yes Jon Billings, NP  gabapentin (NEURONTIN) 800 MG tablet TAKE 1 TABLET(800 MG) BY MOUTH THREE TIMES DAILY 08/03/20  Yes Jon Billings, NP  pantoprazole (PROTONIX) 40 MG tablet TAKE 1 TABLET(40 MG) BY MOUTH TWICE DAILY BEFORE A MEAL 07/30/20  Yes Jon Billings, NP  Thiamine HCl (VITAMIN B-1) 250 MG tablet Take 250 mg by mouth daily.   Yes [provider]  cyclobenzaprine (FLEXERIL) 10 MG tablet TAKE 1 TABLET(10 MG) BY MOUTH THREE TIMES DAILY AS NEEDED  FOR MUSCLE SPASMS. DO NOT DRIVE WHILE TAKING THIS MEDICATION Patient not taking: Reported on 08/22/2020 10/18/19   Volney American, PA-C  ondansetron (ZOFRAN) 4 MG tablet Take 1 tablet (4 mg total) by mouth every 8 (eight) hours as needed for nausea or vomiting. Patient not taking: Reported on 08/22/2020 08/16/20   Jon Billings, NP    Family History  Problem Relation Age of Onset  . Hypertension Father   . Multiple sclerosis Maternal Grandmother   . Cancer Maternal Grandfather      Social History   Tobacco Use  . Smoking status: Current Every Day Smoker    Packs/day: 1.00    Types: Cigarettes  . Smokeless tobacco: Never Used  Vaping Use  . Vaping Use: Never used  Substance Use Topics  . Alcohol use: No    Alcohol/week: 0.0 standard drinks  . Drug use: No    Allergies as of 08/22/2020 - Review Complete 08/22/2020  Allergen Reaction Noted  . Sulfa antibiotics Other (See Comments) 03/27/2011    Review of Systems:    All systems reviewed and negative except where noted in HPI.   Physical Exam:  BP (!) 149/92   Pulse 86   Temp 97.7 F (36.5 C) (Oral)   Ht 5\' 8"  (1.727 m)   Wt 294 lb 12.8 oz (133.7 kg)   BMI 44.82 kg/m  No  LMP recorded. Psych:  Alert and cooperative. Normal mood and affect. General:   Alert,  Well-developed, well-nourished, pleasant and cooperative in NAD Head:  Normocephalic and atraumatic. Eyes:  Sclera clear, no icterus.   Conjunctiva pink. Ears:  Normal auditory acuity. Nose:  No deformity, discharge, or lesions. Mouth:  No deformity or lesions,oropharynx pink & moist. Neck:  Supple; no masses or thyromegaly. Abdomen:  Normal bowel sounds.  No bruits.  Soft, non-tender and non-distended without masses, hepatosplenomegaly or hernias noted.  No guarding or rebound tenderness.    Msk:  Symmetrical without gross deformities. Good, equal movement & strength bilaterally. Pulses:  Normal pulses noted. Extremities:  No clubbing or edema.  No  cyanosis. Neurologic:  Alert and oriented x3;  grossly normal neurologically. Skin:  Intact without significant lesions or rashes. No jaundice. Lymph Nodes:  No significant cervical adenopathy. Psych:  Alert and cooperative. Normal mood and affect.   Labs: CBC    Component Value Date/Time   WBC 6.4 11/23/2019 1530   WBC 7.1 11/05/2015 2053   RBC 5.14 11/23/2019 1530   RBC 4.23 11/05/2015 2053   HGB 14.2 11/23/2019 1530   HCT 44.2 11/23/2019 1530   PLT 309 11/23/2019 1530   MCV 86 11/23/2019 1530   MCH 27.6 11/23/2019 1530   MCH 29.1 11/05/2015 2053   MCHC 32.1 11/23/2019 1530   MCHC 33.2 11/05/2015 2053   RDW 13.0 11/23/2019 1530   LYMPHSABS 1.9 11/23/2019 1530   MONOABS 0.4 11/05/2015 2053   EOSABS 0.3 11/23/2019 1530   BASOSABS 0.1 11/23/2019 1530   CMP     Component Value Date/Time   NA 139 12/01/2019 1449   K 5.7 (H) 12/01/2019 1449   CL 105 12/01/2019 1449   CO2 23 12/01/2019 1449   GLUCOSE 91 12/01/2019 1449   GLUCOSE 84 11/05/2015 2053   BUN 9 12/01/2019 1449   CREATININE 0.98 12/01/2019 1449   CALCIUM 9.4 12/01/2019 1449   PROT 7.0 12/01/2019 1449   ALBUMIN 4.4 12/01/2019 1449   AST 17 12/01/2019 1449   ALT 10 12/01/2019 1449   ALKPHOS 57 12/01/2019 1449   BILITOT <0.2 12/01/2019 1449   GFRNONAA 78 12/01/2019 1449   GFRAA 90 12/01/2019 1449    Imaging Studies: No results found.  Assessment and Plan:   Melissa Mendez is a 30 y.o. y/o female has been referred for GERD, nausea or vomiting  Heartburn symptoms have improved with PPI Nausea vomiting continue on a daily basis despite PPI treatment  We discussed cyclical vomiting syndrome with marijuana use, but patient is convinced this is not from that, given that she has been using marijuana for years and symptoms started over a year ago.  Patient states that marijuana helps her  She is frustrated with her symptoms given that they are correct daily dose affecting her quality of life and job  We  discussed conservative measures first such as stopping marijuana use, imaging, medical therapy and others prior to proceeding with upper endoscopy.  However, given ongoing symptoms, patient would like to proceed with upper endoscopy at this time  I have discussed alternative options, risks & benefits,  which include, but are not limited to, bleeding, infection, perforation,respiratory complication & drug reaction.  The patient agrees with this plan & written consent will be obtained.    Encouraged to abstain from marijuana use as well  EGD would also help [place] evaluate for any hiatal hernia is causing her symptoms  Dr Melissa Mendez  Speech  recognition software was used to dictate the above note.

## 2020-08-26 ENCOUNTER — Other Ambulatory Visit: Payer: Self-pay

## 2020-08-26 ENCOUNTER — Encounter: Payer: Self-pay | Admitting: Gastroenterology

## 2020-08-26 NOTE — Discharge Instructions (Signed)

## 2020-08-27 ENCOUNTER — Ambulatory Visit: Payer: Medicaid Other | Admitting: Anesthesiology

## 2020-08-27 ENCOUNTER — Encounter: Payer: Self-pay | Admitting: Gastroenterology

## 2020-08-27 ENCOUNTER — Encounter
Admission: RE | Disposition: A | Discharge status: Home / Self Care | Payer: Self-pay | Source: Home / Self Care | Attending: Gastroenterology

## 2020-08-27 ENCOUNTER — Ambulatory Visit
Admission: RE | Admit: 2020-08-27 | Discharge: 2020-08-27 | Disposition: A | Discharge status: Home / Self Care | Payer: Medicaid Other | Attending: Gastroenterology | Admitting: Gastroenterology

## 2020-08-27 DIAGNOSIS — K219 Gastro-esophageal reflux disease without esophagitis: Secondary | ICD-10-CM

## 2020-08-27 DIAGNOSIS — Z79899 Other long term (current) drug therapy: Secondary | ICD-10-CM | POA: Insufficient documentation

## 2020-08-27 DIAGNOSIS — R112 Nausea with vomiting, unspecified: Secondary | ICD-10-CM

## 2020-08-27 DIAGNOSIS — F17210 Nicotine dependence, cigarettes, uncomplicated: Secondary | ICD-10-CM | POA: Insufficient documentation

## 2020-08-27 DIAGNOSIS — K2980 Duodenitis without bleeding: Secondary | ICD-10-CM | POA: Insufficient documentation

## 2020-08-27 DIAGNOSIS — R110 Nausea: Secondary | ICD-10-CM

## 2020-08-27 DIAGNOSIS — K2970 Gastritis, unspecified, without bleeding: Secondary | ICD-10-CM | POA: Diagnosis not present

## 2020-08-27 DIAGNOSIS — K319 Disease of stomach and duodenum, unspecified: Secondary | ICD-10-CM | POA: Diagnosis not present

## 2020-08-27 DIAGNOSIS — K2100 Gastro-esophageal reflux disease with esophagitis, without bleeding: Secondary | ICD-10-CM | POA: Insufficient documentation

## 2020-08-27 DIAGNOSIS — Z882 Allergy status to sulfonamides status: Secondary | ICD-10-CM | POA: Insufficient documentation

## 2020-08-27 DIAGNOSIS — R12 Heartburn: Secondary | ICD-10-CM | POA: Diagnosis present

## 2020-08-27 HISTORY — PX: ESOPHAGOGASTRODUODENOSCOPY (EGD) WITH PROPOFOL: SHX5813

## 2020-08-27 LAB — POCT PREGNANCY, URINE: Preg Test, Ur: NEGATIVE

## 2020-08-27 SURGERY — ESOPHAGOGASTRODUODENOSCOPY (EGD) WITH PROPOFOL
Anesthesia: General

## 2020-08-27 MED ORDER — ACETAMINOPHEN 10 MG/ML IV SOLN: 1000.0000 mg | Freq: Once | INTRAVENOUS | Status: DC | PRN

## 2020-08-27 MED ORDER — LIDOCAINE HCL (CARDIAC) PF 100 MG/5ML IV SOSY
PREFILLED_SYRINGE | INTRAVENOUS | Status: DC | PRN
  Administered 2020-08-27: 80 mg via INTRAVENOUS

## 2020-08-27 MED ORDER — PROPOFOL 10 MG/ML IV BOLUS
INTRAVENOUS | Status: DC | PRN
  Administered 2020-08-27: 20 mg via INTRAVENOUS
  Administered 2020-08-27 (×2): 30 mg via INTRAVENOUS
  Administered 2020-08-27: 60 mg via INTRAVENOUS
  Administered 2020-08-27: 20 mg via INTRAVENOUS
  Administered 2020-08-27 (×2): 30 mg via INTRAVENOUS

## 2020-08-27 MED ORDER — SODIUM CHLORIDE 0.9 % IV SOLN: INTRAVENOUS | Status: DC

## 2020-08-27 MED ORDER — LACTATED RINGERS IV SOLN: INTRAVENOUS | Status: DC

## 2020-08-27 MED ORDER — ONDANSETRON HCL 4 MG/2ML IJ SOLN: 4.0000 mg | Freq: Once | INTRAMUSCULAR | Status: DC | PRN

## 2020-08-27 SURGICAL SUPPLY — 35 items
BALLN DILATOR 10-12 8 (BALLOONS)
BALLN DILATOR 12-15 8 (BALLOONS)
BALLN DILATOR 15-18 8 (BALLOONS)
BALLN DILATOR CRE 0-12 8 (BALLOONS)
BALLN DILATOR ESOPH 8 10 CRE (MISCELLANEOUS) IMPLANT
BALLOON DILATOR 12-15 8 (BALLOONS) IMPLANT
BALLOON DILATOR 15-18 8 (BALLOONS) IMPLANT
BALLOON DILATOR CRE 0-12 8 (BALLOONS) IMPLANT
BLOCK BITE 60FR ADLT L/F GRN (MISCELLANEOUS) ×2 IMPLANT
BRUSH CYTO GASTROSCOPE 3.0 (MISCELLANEOUS) ×2 IMPLANT
CLIP HMST 235XBRD CATH ROT (MISCELLANEOUS) IMPLANT
CLIP RESOLUTION 360 11X235 (MISCELLANEOUS)
ELECT REM PT RETURN 9FT ADLT (ELECTROSURGICAL)
ELECTRODE REM PT RTRN 9FT ADLT (ELECTROSURGICAL) IMPLANT
FCP ESCP3.2XJMB 240X2.8X (MISCELLANEOUS)
FORCEPS BIOP RAD 4 LRG CAP 4 (CUTTING FORCEPS) ×2 IMPLANT
FORCEPS BIOP RJ4 240 W/NDL (MISCELLANEOUS)
FORCEPS ESCP3.2XJMB 240X2.8X (MISCELLANEOUS) IMPLANT
GOWN CVR UNV OPN BCK APRN NK (MISCELLANEOUS) ×2 IMPLANT
GOWN ISOL THUMB LOOP REG UNIV (MISCELLANEOUS) ×4
INJECTOR VARIJECT VIN23 (MISCELLANEOUS) IMPLANT
KIT DEFENDO VALVE AND CONN (KITS) IMPLANT
KIT PRC NS LF DISP ENDO (KITS) ×1 IMPLANT
KIT PROCEDURE OLYMPUS (KITS) ×2
MANIFOLD NEPTUNE II (INSTRUMENTS) ×2 IMPLANT
MARKER SPOT ENDO TATTOO 5ML (MISCELLANEOUS) IMPLANT
RETRIEVER NET PLAT FOOD (MISCELLANEOUS) IMPLANT
SNARE SHORT THROW 13M SML OVAL (MISCELLANEOUS) IMPLANT
SNARE SHORT THROW 30M LRG OVAL (MISCELLANEOUS) IMPLANT
SPOT EX ENDOSCOPIC TATTOO (MISCELLANEOUS)
SYR INFLATION 60ML (SYRINGE) IMPLANT
TRAP ETRAP POLY (MISCELLANEOUS) IMPLANT
VARIJECT INJECTOR VIN23 (MISCELLANEOUS)
WATER STERILE IRR 250ML POUR (IV SOLUTION) ×2 IMPLANT
WIRE CRE 18-20MM 8CM F G (MISCELLANEOUS) IMPLANT

## 2020-08-27 NOTE — Anesthesia Preprocedure Evaluation (Signed)
Anesthesia Evaluation  Patient identified by MRN, date of birth, ID band Patient awake    Reviewed: Allergy & Precautions, NPO status , Patient's Chart, lab work & pertinent test results, reviewed documented beta blocker date and time   History of Anesthesia Complications Negative for: history of anesthetic complications  Airway Mallampati: II  TM Distance: >3 FB Neck ROM: Full    Dental  (+)    Pulmonary Current SmokerPatient did not abstain from smoking.,    breath sounds clear to auscultation       Cardiovascular (-) angina(-) DOE  Rhythm:Regular Rate:Normal     Neuro/Psych  Headaches, PSYCHIATRIC DISORDERS Anxiety Depression    GI/Hepatic neg GERD  ,(+)     substance abuse  ,   Endo/Other    Renal/GU      Musculoskeletal  (+) Fibromyalgia -  Abdominal (+) + obese (BMI 45),   Peds  Hematology   Anesthesia Other Findings   Reproductive/Obstetrics                             Anesthesia Physical Anesthesia Plan  ASA: III  Anesthesia Plan: General   Post-op Pain Management:    Induction: Intravenous  PONV Risk Score and Plan: 2 and Propofol infusion, TIVA and Treatment may vary due to age or medical condition  Airway Management Planned: Natural Airway and Nasal Cannula  Additional Equipment:   Intra-op Plan:   Post-operative Plan:   Informed Consent: I have reviewed the patients History and Physical, chart, labs and discussed the procedure including the risks, benefits and alternatives for the proposed anesthesia with the patient or authorized representative who has indicated his/her understanding and acceptance.       Plan Discussed with: CRNA and Anesthesiologist  Anesthesia Plan Comments:         Anesthesia Quick Evaluation

## 2020-08-27 NOTE — Op Note (Signed)
[facility] Gastroenterology Patient Name: Melissa Mendez Procedure Date: 08/27/2020 11:03 AM MRN: [id number] Account #: [id number] Date of Birth: [birth year] Admit Type: Outpatient Age: 30 Room: [facility] OR ROOM 01 Gender: Female Note Status: Finalized Procedure:             Upper GI endoscopy Indications:           Heartburn, Nausea with vomiting Providers:             Lander Eslick B. Bonna Gains MD, MD Referring MD:          Jon Billings (Referring MD) Medicines:             Monitored Anesthesia Care Complications:         No immediate complications. Procedure:             Pre-Anesthesia Assessment:                        - The risks and benefits of the procedure and the                         sedation options and risks were discussed with the                         patient. All questions were answered and informed                         consent was obtained.                        - Patient identification and proposed procedure were                         verified prior to the procedure.                        - ASA Grade Assessment: II - A patient with mild                         systemic disease.                        After obtaining informed consent, the endoscope was                         passed under direct vision. Throughout the procedure,                         the patient's blood pressure, pulse, and oxygen                         saturations were monitored continuously. The was                         introduced through the mouth, and advanced to the                         second part of duodenum. The upper GI endoscopy was                         accomplished with ease.  The patient tolerated the                         procedure well. Findings:      LA Grade A (one or more mucosal breaks less than 5 mm, not extending       between tops of 2 mucosal folds) esophagitis with no bleeding was found       in the distal esophagus.      White nummular  lesions were noted in the entire esophagus. Brushings for       KOH prep were obtained.      The exam of the esophagus was otherwise normal.      Patchy mildly erythematous mucosa without bleeding was found in the       gastric antrum. Biopsies were obtained in the gastric body, at the       incisura and in the gastric antrum with cold forceps for Helicobacter       pylori testing.      A medium amount of food (residue) was found in the gastric fundus and in       the gastric body.      The exam of the stomach was otherwise normal.      The examined duodenum was normal. Biopsies were taken with a cold       forceps for histology. Impression:            - LA Grade A reflux esophagitis with no bleeding.                        - White nummular lesions in esophageal mucosa.                         Brushings performed.                        - Erythematous mucosa in the antrum.                        - A medium amount of food (residue) in the stomach.                        - Normal examined duodenum. Biopsied.                        - Biopsies were obtained in the gastric body, at the                         incisura and in the gastric antrum. Recommendation:        - Await pathology results.                        - Continue present medications.                        - Do a gastric emptying study at appointment to be                         scheduled.                        - Follow an antireflux regimen.                        -  Return to my office as previously scheduled.                        - The findings and recommendations were discussed with                         the patient.                        - The findings and recommendations were discussed with                         the patient's family. Procedure Code(s):     --- Professional ---                        [phone], Esophagogastroduodenoscopy, flexible,                         transoral; with biopsy, single or  multiple Diagnosis Code(s):     --- Professional ---                        K21.00, Gastro-esophageal reflux disease with                         esophagitis, without bleeding                        K22.8, Other specified diseases of esophagus                        K31.89, Other diseases of stomach and duodenum                        R12, Heartburn                        R11.2, Nausea with vomiting, unspecified CPT copyright 2019 American Medical Association. All rights reserved. The codes documented in this report are preliminary and upon coder review may  be revised to meet current compliance requirements.  Vonda Antigua, MD Margretta Sidle B. Bonna Gains MD, MD 08/27/2020 11:37:25 AM This report has been signed electronically. Number of Addenda: 0 Note Initiated On: 08/27/2020 11:03 AM Total Procedure Duration: 0 hours 10 minutes 33 seconds  Estimated Blood Loss:  Estimated blood loss: none.      [facility]

## 2020-08-27 NOTE — H&P (Signed)
Vonda Antigua, MD [address], [place], [place], [place], [address] [address], [place], [place], [place], [address] Phone: [phone]  Fax: [phone]  Primary Care Physician:  Jon Billings, NP   Pre-Procedure History & Physical: HPI:  Melissa Mendez is a 30 y.o. female is here for an EGD.   Past Medical History:  Diagnosis Date  . Anxiety   . Depression   . Fibromyalgia   . Fibromyalgia   . Migraines   . Obesity     Past Surgical History:  Procedure Laterality Date  . TONSILLECTOMY AND ADENOIDECTOMY  1993  . WISDOM TOOTH EXTRACTION      Prior to Admission medications   Medication Sig Start Date End Date Taking? Authorizing Provider  cyclobenzaprine (FLEXERIL) 10 MG tablet TAKE 1 TABLET(10 MG) BY MOUTH THREE TIMES DAILY AS NEEDED FOR MUSCLE SPASMS. DO NOT DRIVE WHILE TAKING THIS MEDICATION 10/18/19  Yes Volney American, PA-C  DULoxetine (CYMBALTA) 30 MG capsule TAKE 2 CAPSULES(60 MG) BY MOUTH DAILY 07/15/20  Yes Jon Billings, NP  escitalopram (LEXAPRO) 10 MG tablet TAKE 1 TABLET(10 MG) BY MOUTH DAILY 07/30/20  Yes Jon Billings, NP  gabapentin (NEURONTIN) 800 MG tablet TAKE 1 TABLET(800 MG) BY MOUTH THREE TIMES DAILY 08/03/20  Yes Jon Billings, NP  pantoprazole (PROTONIX) 40 MG tablet TAKE 1 TABLET(40 MG) BY MOUTH TWICE DAILY BEFORE A MEAL 07/30/20  Yes Jon Billings, NP  Thiamine HCl (VITAMIN B-1) 250 MG tablet Take 250 mg by mouth daily.   Yes [provider]  ondansetron (ZOFRAN) 4 MG tablet Take 1 tablet (4 mg total) by mouth every 8 (eight) hours as needed for nausea or vomiting. Patient not taking: Reported on 08/22/2020 08/16/20   Jon Billings, NP    Allergies as of 08/22/2020 - Review Complete 08/22/2020  Allergen Reaction Noted  . Sulfa antibiotics Other (See Comments) 03/27/2011    Family History  Problem Relation Age of Onset  . Hypertension Father   . Multiple sclerosis Maternal Grandmother   . Cancer  Maternal Grandfather     Social History   Socioeconomic History  . Marital status: Single    Spouse name: Not on file  . Number of children: Not on file  . Years of education: Not on file  . Highest education level: Not on file  Occupational History  . Not on file  Tobacco Use  . Smoking status: Current Every Day Smoker    Packs/day: 1.00    Types: Cigarettes  . Smokeless tobacco: Never Used  Vaping Use  . Vaping Use: Never used  Substance and Sexual Activity  . Alcohol use: No    Alcohol/week: 0.0 standard drinks  . Drug use: No  . Sexual activity: Yes    Birth control/protection: None  Other Topics Concern  . Not on file  Social History Narrative   Single.   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   Enjoys painting, being with her friends and boyfriend.      Social Determinants of Health   Financial Resource Strain: Not on file  Food Insecurity: Not on file  Transportation Needs: Not on file  Physical Activity: Not on file  Stress: Not on file  Social Connections: Not on file  Intimate Partner Violence: Not on file    Review of Systems: See HPI, otherwise negative ROS  Physical Exam: BP (!) 145/83   Pulse 78   Temp (!) 96.4 F (35.8 C) (Temporal)  Resp 16   Ht 5\' 8"  (1.727 m)   Wt 135.6 kg   SpO2 97%   BMI 45.46 kg/m  General:   Alert,  pleasant and cooperative in NAD Head:  Normocephalic and atraumatic. Neck:  Supple; no masses or thyromegaly. Lungs:  Clear throughout to auscultation, normal respiratory effort.    Heart:  +S1, +S2, Regular rate and rhythm, No edema. Abdomen:  Soft, nontender and nondistended. Normal bowel sounds, without guarding, and without rebound.   Neurologic:  Alert and  oriented x4;  grossly normal neurologically.  Impression/Plan: Melissa Mendez is here for an EGD for Acid Reflux, Nausea/vomiting.  Risks, benefits, limitations, and alternatives regarding the procedure have been reviewed  with the patient.  Questions have been answered.  All parties agreeable.   Virgel Manifold, MD  08/27/2020, 9:34 AM

## 2020-08-27 NOTE — Transfer of Care (Signed)
Immediate Anesthesia Transfer of Care Note  Patient: Melissa Mendez  Procedure(s) Performed: ESOPHAGOGASTRODUODENOSCOPY (EGD) WITH PROPOFOL (N/A )  Patient Location: PACU  Anesthesia Type: General  Level of Consciousness: awake, alert  and patient cooperative  Airway and Oxygen Therapy: Patient Spontanous Breathing and Patient connected to supplemental oxygen  Post-op Assessment: Post-op Vital signs reviewed, Patient's Cardiovascular Status Stable, Respiratory Function Stable, Patent Airway and No signs of Nausea or vomiting  Post-op Vital Signs: Reviewed and stable  Complications: No complications documented.

## 2020-08-27 NOTE — Anesthesia Procedure Notes (Signed)
Performed by: Dionne Bucy, CRNA Pre-anesthesia Checklist: Patient identified, Emergency Drugs available, Suction available, Patient being monitored and Timeout performed Patient Re-evaluated:Patient Re-evaluated prior to induction Oxygen Delivery Method: Circle system utilized and Nasal cannula

## 2020-08-27 NOTE — Anesthesia Postprocedure Evaluation (Signed)
Anesthesia Post Note  Patient: Melissa Mendez  Procedure(s) Performed: ESOPHAGOGASTRODUODENOSCOPY (EGD) WITH PROPOFOL (N/A )     Patient location during evaluation: PACU Anesthesia Type: General Level of consciousness: awake and alert Pain management: pain level controlled Vital Signs Assessment: post-procedure vital signs reviewed and stable Respiratory status: spontaneous breathing, nonlabored ventilation, respiratory function stable and patient connected to nasal cannula oxygen Cardiovascular status: blood pressure returned to baseline and stable Postop Assessment: no apparent nausea or vomiting Anesthetic complications: no   No complications documented.  Kathee Tumlin A  Giannis Corpuz

## 2020-08-28 ENCOUNTER — Encounter: Payer: Self-pay | Admitting: Gastroenterology

## 2020-08-29 ENCOUNTER — Telehealth: Payer: Self-pay

## 2020-08-29 LAB — SURGICAL PATHOLOGY

## 2020-08-29 NOTE — Telephone Encounter (Signed)
Referral was sent to [facility].  Did she receive a new patient packet from them to fill out?

## 2020-08-29 NOTE — Telephone Encounter (Signed)
Copied from [place] [phone]. Topic: General - Other >> Aug 29, 2020  1:34 PM Leward Quan A wrote: Reason for CRM: Patient called in to inquire of Jon Billings about a referral for a Psychiatrist say she have been waiting since last visit. Please advise

## 2020-08-29 NOTE — Telephone Encounter (Signed)
Patient states she has not heard from them or received a new patient packet. Patient has an appointment on Monday here to follow up depression and anxiety.

## 2020-08-30 LAB — CYTOLOGY - NON PAP

## 2020-09-02 ENCOUNTER — Ambulatory Visit: Payer: Self-pay | Admitting: Nurse Practitioner

## 2020-09-02 ENCOUNTER — Other Ambulatory Visit: Payer: Self-pay

## 2020-09-02 ENCOUNTER — Ambulatory Visit (INDEPENDENT_AMBULATORY_CARE_PROVIDER_SITE_OTHER): Payer: Medicaid Other | Admitting: Gastroenterology

## 2020-09-02 ENCOUNTER — Encounter: Payer: Self-pay | Admitting: Gastroenterology

## 2020-09-02 VITALS — BP 154/81 | HR 78 | Temp 98.1°F | Ht 68.0 in | Wt 295.4 lb

## 2020-09-02 DIAGNOSIS — R112 Nausea with vomiting, unspecified: Secondary | ICD-10-CM | POA: Diagnosis not present

## 2020-09-02 NOTE — Patient Instructions (Signed)
We will call you back with your date and time for the gastric emptying study.

## 2020-09-02 NOTE — Progress Notes (Signed)
Vonda Antigua, MD [address]  [place]  [place], [place] [address]  Main: [phone]  Fax: [phone]   Primary Care Physician: Jon Billings, NP   Chief complaint: Nausea vomiting  HPI: Melissa Mendez is a 30 y.o. female here for follow-up of nausea and vomiting.  Patient's upper endoscopy showed food in the stomach and she states she did fast after midnight the night before the procedure as she was instructed.  Patient has had chronic symptoms of nausea vomiting and states that now thinking back, sometimes when she vomits she even sees mushrooms that she ate 3 to 4 days before.  Pathology report from upper endoscopy did not show any H. pylori  Current Outpatient Medications  Medication Sig Dispense Refill  . cyclobenzaprine (FLEXERIL) 10 MG tablet TAKE 1 TABLET(10 MG) BY MOUTH THREE TIMES DAILY AS NEEDED FOR MUSCLE SPASMS. DO NOT DRIVE WHILE TAKING THIS MEDICATION 30 tablet 0  . DULoxetine (CYMBALTA) 30 MG capsule TAKE 2 CAPSULES(60 MG) BY MOUTH DAILY 180 capsule 0  . escitalopram (LEXAPRO) 10 MG tablet TAKE 1 TABLET(10 MG) BY MOUTH DAILY 45 tablet 0  . gabapentin (NEURONTIN) 800 MG tablet TAKE 1 TABLET(800 MG) BY MOUTH THREE TIMES DAILY 90 tablet 2  . ondansetron (ZOFRAN) 4 MG tablet Take 1 tablet (4 mg total) by mouth every 8 (eight) hours as needed for nausea or vomiting. 20 tablet 0  . pantoprazole (PROTONIX) 40 MG tablet TAKE 1 TABLET(40 MG) BY MOUTH TWICE DAILY BEFORE A MEAL 180 tablet 0  . Thiamine HCl (VITAMIN B-1) 250 MG tablet Take 250 mg by mouth daily.     No current facility-administered medications for this visit.    Allergies as of 09/02/2020 - Review Complete 09/02/2020  Allergen Reaction Noted  . Sulfa antibiotics Other (See Comments) 03/27/2011    ROS:  General: Negative for anorexia, weight loss, fever, chills, fatigue, weakness. ENT: Negative for hoarseness, difficulty swallowing , nasal congestion. CV: Negative for chest pain,  angina, palpitations, dyspnea on exertion, peripheral edema.  Respiratory: Negative for dyspnea at rest, dyspnea on exertion, cough, sputum, wheezing.  GI: See history of present illness. GU:  Negative for dysuria, hematuria, urinary incontinence, urinary frequency, nocturnal urination.  Endo: Negative for unusual weight change.    Physical Examination:   BP (!) 154/81   Pulse 78   Temp 98.1 F (36.7 C) (Oral)   Ht 5\' 8"  (1.727 m)   Wt 295 lb 6.4 oz (134 kg)   BMI 44.92 kg/m   General: Well-nourished, well-developed in no acute distress.  Eyes: No icterus. Conjunctivae pink. Mouth: Oropharyngeal mucosa moist and pink , no lesions erythema or exudate. Neck: Supple, Trachea midline Abdomen: Bowel sounds are normal, nontender, nondistended, no hepatosplenomegaly or masses, no abdominal bruits or hernia , no rebound or guarding.   Extremities: No lower extremity edema. No clubbing or deformities. Neuro: Alert and oriented x 3.  Grossly intact. Skin: Warm and dry, no jaundice.   Psych: Alert and cooperative, normal mood and affect.   Labs: CMP     Component Value Date/Time   NA 139 12/01/2019 1449   K 5.7 (H) 12/01/2019 1449   CL 105 12/01/2019 1449   CO2 23 12/01/2019 1449   GLUCOSE 91 12/01/2019 1449   GLUCOSE 84 11/05/2015 2053   BUN 9 12/01/2019 1449   CREATININE 0.98 12/01/2019 1449   CALCIUM 9.4 12/01/2019 1449   PROT 7.0 12/01/2019 1449   ALBUMIN 4.4 12/01/2019 1449  AST 17 12/01/2019 1449   ALT 10 12/01/2019 1449   ALKPHOS 57 12/01/2019 1449   BILITOT <0.2 12/01/2019 1449   GFRNONAA 78 12/01/2019 1449   GFRAA 90 12/01/2019 1449   Lab Results  Component Value Date   WBC 6.4 11/23/2019   HGB 14.2 11/23/2019   HCT 44.2 11/23/2019   MCV 86 11/23/2019   PLT 309 11/23/2019    Imaging Studies: No results found.  Assessment and Plan:   Melissa Mendez is a 30 y.o. y/o female here for follow-up of nausea vomiting  Obtain gastric emptying study due to ongoing  symptoms and food seen in the stomach on upper endoscopy despite a typical fast prior to the procedure  In the meantime, small frequent meals recommended    Dr Vonda Antigua

## 2020-09-02 NOTE — Progress Notes (Deleted)
There were no vitals taken for this visit.   Subjective:    Patient ID: Melissa Mendez, female    DOB: 05-23-1990, 30 y.o.   MRN: [id number]  HPI: Melissa Mendez is a 30 y.o. female  No chief complaint on file.  GI CONCERNS Patient states that she feels like she has food stuck in her esophagus.  Patient also throws up 2-3 times a week.  Patient would like a referral to GI.  Patient has resumed taking her Protonix   DEPRESSION AND ANXIETY Patient was having trouble with her insurance and unable to get her medications.  She was out of the medication for 1-2 months.  Patient has not restarted her Duloxetine.  Patient has taken Lexapro in the past.   Denies SI/HI.  Patient states she is constantly worrying.  Patient overall just hasn't been feeling well.  Has gained weight and concerned she has a thyroid problem.   FIBROMYALGIA Patient has been experiencing a lot of pain.  She has been out of her medication. 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 However, she does feel like the current medication regimen is helping to manage her symptoms.    Relevant past medical, surgical, family and social history reviewed and updated as indicated. Interim medical history since our last visit reviewed.  Allergies and medications reviewed and updated.  Review of Systems  Gastrointestinal: Positive for vomiting.       Feels like something is stuck in esophagus. Heart burn.  Musculoskeletal: Positive for myalgias.  Psychiatric/Behavioral: Positive for dysphoric mood. The patient is nervous/anxious.     Per HPI unless specifically indicated above     Objective:    There were no vitals taken for this visit.  Wt Readings from Last 3 Encounters:  09/02/20 295 lb 6.4 oz (134 kg)  08/27/20 299 lb (135.6 kg)  08/22/20 294 lb 12.8 oz (133.7 kg)    Physical Exam Vitals and nursing note reviewed.  Constitutional:      General: She is not in acute distress.     Appearance: Normal appearance. She is normal weight. She is not ill-appearing, toxic-appearing or diaphoretic.  HENT:     Head: Normocephalic.     Right Ear: External ear normal.     Left Ear: External ear normal.     Nose: Nose normal.     Mouth/Throat:     Mouth: Mucous membranes are moist.     Pharynx: Oropharynx is clear.  Eyes:     General:        Right eye: No discharge.        Left eye: No discharge.     Extraocular Movements: Extraocular movements intact.     Conjunctiva/sclera: Conjunctivae normal.     Pupils: Pupils are equal, round, and reactive to light.  Cardiovascular:     Rate and Rhythm: Normal rate and regular rhythm.     Heart sounds: No murmur heard.   Pulmonary:     Effort: Pulmonary effort is normal. No respiratory distress.     Breath sounds: Normal breath sounds. No wheezing or rales.  Musculoskeletal:     Cervical back: Normal range of motion and neck supple.  Skin:    General: Skin is warm and dry.     Capillary Refill: Capillary refill takes less than 2 seconds.  Neurological:     General: No focal deficit present.     Mental Status: She  is alert and oriented to person, place, and time. Mental status is at baseline.  Psychiatric:        Mood and Affect: Mood normal.        Behavior: Behavior normal.        Thought Content: Thought content normal.        Judgment: Judgment normal.      Assessment & Plan:   Problem List Items Addressed This Visit   None      Follow up plan: No follow-ups on file.

## 2020-09-04 ENCOUNTER — Other Ambulatory Visit: Payer: Self-pay

## 2020-09-04 ENCOUNTER — Telehealth: Payer: Self-pay

## 2020-09-04 DIAGNOSIS — R112 Nausea with vomiting, unspecified: Secondary | ICD-10-CM

## 2020-09-04 NOTE — Telephone Encounter (Signed)
LVM for pt to return my call to schedule a gastric emptying study.

## 2020-09-06 ENCOUNTER — Telehealth: Payer: Self-pay | Admitting: Gastroenterology

## 2020-09-06 NOTE — Telephone Encounter (Signed)
Calling about scheduling a gastric empty study.

## 2020-09-09 ENCOUNTER — Ambulatory Visit: Payer: Self-pay | Admitting: Gastroenterology

## 2020-09-09 ENCOUNTER — Ambulatory Visit: Payer: Medicaid Other | Admitting: Gastroenterology

## 2020-09-10 ENCOUNTER — Ambulatory Visit: Payer: Self-pay | Admitting: Nurse Practitioner

## 2020-09-10 NOTE — Progress Notes (Deleted)
   There were no vitals taken for this visit.   Subjective:    Patient ID: Melissa Mendez, female    DOB: [birth year], 30 y.o.   MRN: [id number]  HPI: Melissa Mendez is a 30 y.o. female  No chief complaint on file.  DEPRESSION/ANXIETY  GERD Relevant past medical, surgical, family and social history reviewed and updated as indicated. Interim medical history since our last visit reviewed. Allergies and medications reviewed and updated.  Review of Systems  Per HPI unless specifically indicated above     Objective:    There were no vitals taken for this visit.  Wt Readings from Last 3 Encounters:  09/02/20 295 lb 6.4 oz (134 kg)  08/27/20 299 lb (135.6 kg)  08/22/20 294 lb 12.8 oz (133.7 kg)    Physical Exam   Assessment & Plan:   Problem List Items Addressed This Visit   None      Follow up plan: No follow-ups on file.

## 2020-09-12 ENCOUNTER — Telehealth: Payer: Self-pay | Admitting: Gastroenterology

## 2020-09-12 NOTE — Telephone Encounter (Signed)
Please see phone note from 09/12/2020.

## 2020-09-12 NOTE — Telephone Encounter (Signed)
Called patient to let her know that [facility] 8:30 AM. Patient was also instructed to have nothing to eat or drink from midnight the night before and no GI medications for 8 hours prior. Patient understood and had no further questions.

## 2020-09-12 NOTE — Telephone Encounter (Signed)
Patient called and is ready to schedule her gastric empty study.

## 2020-09-14 ENCOUNTER — Other Ambulatory Visit: Payer: Self-pay | Admitting: Nurse Practitioner

## 2020-09-14 DIAGNOSIS — M797 Fibromyalgia: Secondary | ICD-10-CM

## 2020-09-14 DIAGNOSIS — F419 Anxiety disorder, unspecified: Secondary | ICD-10-CM

## 2020-09-14 DIAGNOSIS — F32A Depression, unspecified: Secondary | ICD-10-CM

## 2020-09-14 NOTE — Telephone Encounter (Signed)
Requested medications are due for refill today yes  Requested medications are on the active medication list yes  Last refill 4/12  Last visit 07/08/20  Future visit scheduled no  Notes to clinic Was given 6 week supply and to return, however, no upcoming visit scheduled.

## 2020-09-17 NOTE — Telephone Encounter (Signed)
Scheduled 6/2

## 2020-09-19 ENCOUNTER — Telehealth (INDEPENDENT_AMBULATORY_CARE_PROVIDER_SITE_OTHER): Payer: No Typology Code available for payment source | Admitting: Nurse Practitioner

## 2020-09-19 ENCOUNTER — Encounter: Payer: Self-pay | Admitting: Nurse Practitioner

## 2020-09-19 ENCOUNTER — Other Ambulatory Visit: Payer: Self-pay

## 2020-09-19 DIAGNOSIS — R5382 Chronic fatigue, unspecified: Secondary | ICD-10-CM | POA: Diagnosis not present

## 2020-09-19 DIAGNOSIS — F419 Anxiety disorder, unspecified: Secondary | ICD-10-CM

## 2020-09-19 DIAGNOSIS — F32A Depression, unspecified: Secondary | ICD-10-CM | POA: Diagnosis not present

## 2020-09-19 DIAGNOSIS — M797 Fibromyalgia: Secondary | ICD-10-CM | POA: Diagnosis not present

## 2020-09-19 MED ORDER — ESCITALOPRAM OXALATE 10 MG PO TABS: ORAL_TABLET | ORAL | 1 refills | Status: DC

## 2020-09-19 NOTE — Assessment & Plan Note (Signed)
Chronic.  Well controlled.  Continue with Cymbalta and Lexapro for anxiety and depression.  Refills sent today.  Follow up in 2 months for reevaluation.

## 2020-09-19 NOTE — Progress Notes (Signed)
LMP 08/29/2020 (Within Days)    Subjective:    Patient ID: Margot Chimes, female    DOB: 18-Jan-1991, 30 y.o.   MRN: [id number]  HPI: VIVYAN BIGGERS is a 30 y.o. female  Chief Complaint  Patient presents with  . Anxiety  . Depression  . Referral    Thyroid specialist   DEPRESSION/ANXIETY Patient states that things are a lot better now that she has added the Lexapro.  Patient feels like this is the right dose for her.  Denies concerns about depression and anxiety today.  Flowsheet Row Video Visit from 09/19/2020 in [facility]  PHQ-9 Total Score 4      GAD 7 : Generalized Anxiety Score 09/19/2020 07/08/2020 06/10/2020 02/15/2020  Nervous, Anxious, on Edge 0 0 3 1  Control/stop worrying 1 3 3 1   Worry too much - different things 0 0 3 0  Trouble relaxing 0 0 0 0  Restless 0 0 0 0  Easily annoyed or irritable 1 2 1 1   Afraid - awful might happen 0 1 2 0  Total GAD 7 Score 2 6 12 3   Anxiety Difficulty Not difficult at all Not difficult at all Somewhat difficult Somewhat difficult    NAUSEA Patient states she was just diagnosed with gastroparesis.  She states she has gained about 65lb in about 6 months.  But she throws up a lot of what she eats. Patient is having a gastric emptying study done tomorrow.  Patient made a list of everything that is wrong with her.  This includes.  White spots on mouth and tongue, ACID reflux, weight gain, throwing up, legs and feet swell regularly, severe pain, muscles twitch, muscle tightness, muscle cramping, burps up terrible taste, pee has abnormal smell, difficulty concentrating, low energy, and low sex drive, sweat a lot, knee pain, multiple miscarriages, sometimes it feels like she has burnt her tongue but hasn't  Relevant past medical, surgical, family and social history reviewed and updated as indicated. Interim medical history since our last visit reviewed. Allergies and medications reviewed and updated.  Review of Systems   Constitutional: Positive for appetite change and fatigue.       Sweating a lot, low sex drive  HENT: Positive for mouth sores.        White spots on her tongue. Tongue feels burnt, sweats a lot  Gastrointestinal: Positive for nausea and vomiting.       GERD  Genitourinary:       Urine has foul odor at times  Musculoskeletal: Positive for myalgias.       Muscle twitches, muscle aches, and cramps  Psychiatric/Behavioral: Positive for dysphoric mood. Negative for suicidal ideas. The patient is nervous/anxious.      Per HPI unless specifically indicated above     Objective:    LMP 08/29/2020 (Within Days)   Wt Readings from Last 3 Encounters:  09/02/20 295 lb 6.4 oz (134 kg)  08/27/20 299 lb (135.6 kg)  08/22/20 294 lb 12.8 oz (133.7 kg)    Physical Exam Vitals and nursing note reviewed.  HENT:     Head: Normocephalic.     Right Ear: Hearing normal.     Left Ear: Hearing normal.     Nose: Nose normal.  Eyes:     Pupils: Pupils are equal, round, and reactive to light.  Pulmonary:     Effort: Pulmonary effort is normal. No respiratory distress.  Neurological:     Mental Status: She is alert.  Psychiatric:        Mood and Affect: Mood normal.        Behavior: Behavior normal.        Thought Content: Thought content normal.        Judgment: Judgment normal.        Assessment & Plan:   Problem List Items Addressed This Visit      Other   Fibromyalgia - Primary    Chronic.  Ongoing.  Patient continues to have pain.  Would like to see an endocrinologist to see if her thyroid has anything to do with her symptoms.  Thyroid labs in office have been normal. Referral placed for patient to have further evaluation with specialist.  Follow up in 2 months.      Relevant Medications   escitalopram (LEXAPRO) 10 MG tablet   Other Relevant Orders   Ambulatory referral to Endocrinology   Anxiety and depression    Chronic.  Well controlled.  Continue with Cymbalta and Lexapro for  anxiety and depression.  Refills sent today.  Follow up in 2 months for reevaluation.      Relevant Medications   escitalopram (LEXAPRO) 10 MG tablet    Other Visit Diagnoses    Chronic fatigue       Chronic.  Ongoing.  Referral placed to Endo at patient's request for another opinion on thyroid disorders.    Relevant Orders   Ambulatory referral to Endocrinology       Follow up plan: Return in about 2 months (around 11/19/2020) for Nausea and Fatigue (after seeing Endo).    This visit was completed via MyChart due to the restrictions of the COVID-19 pandemic. All issues as above were discussed and addressed. Physical exam was done as above through visual confirmation on MyChart. If it was felt that the patient should be evaluated in the office, they were directed there. The patient verbally consented to this visit. 1. Location of the patient: Home 2. Location of the provider: Office 3. Those involved with this call:  ? Provider: Jon Billings, NP ? CMA: Tiffany Reel, CMA ? Front Desk/Registration: [facility] 4. Time spent on call: 15 minutes with patient face to face via video conference. More than 50% of this time was spent in counseling and coordination of care. 20 minutes total spent in review of patient's record and preparation of their chart.

## 2020-09-19 NOTE — Assessment & Plan Note (Signed)
Chronic.  Ongoing.  Patient continues to have pain.  Would like to see an endocrinologist to see if her thyroid has anything to do with her symptoms.  Thyroid labs in office have been normal. Referral placed for patient to have further evaluation with specialist.  Follow up in 2 months.

## 2020-09-20 ENCOUNTER — Other Ambulatory Visit: Payer: Self-pay

## 2020-09-20 ENCOUNTER — Encounter
Admission: RE | Admit: 2020-09-20 | Discharge: 2020-09-20 | Disposition: A | Discharge status: Home / Self Care | Payer: Medicaid Other | Source: Ambulatory Visit | Attending: Gastroenterology | Admitting: Gastroenterology

## 2020-09-20 DIAGNOSIS — R112 Nausea with vomiting, unspecified: Secondary | ICD-10-CM

## 2020-09-20 MED ORDER — TECHNETIUM TC 99M SULFUR COLLOID
2.0000 | Freq: Once | INTRAVENOUS | Status: AC | PRN
  Administered 2020-09-20: 2.33 via ORAL

## 2020-09-23 ENCOUNTER — Telehealth: Payer: Self-pay

## 2020-09-23 NOTE — Telephone Encounter (Signed)
lvm to make this apt.  

## 2020-09-23 NOTE — Telephone Encounter (Signed)
-----   Message from Jon Billings, NP sent at 09/19/2020  3:39 PM EDT ----- Can we schedule her follow up?

## 2020-10-02 ENCOUNTER — Ambulatory Visit: Payer: Self-pay | Admitting: *Deleted

## 2020-10-02 NOTE — Telephone Encounter (Signed)
FYI scheduled tomorrow 6/16

## 2020-10-02 NOTE — Telephone Encounter (Signed)
Reason for Disposition  [1] MODERATE leg swelling (e.g., swelling extends up to knees) AND [2] new-onset or worsening  Answer Assessment - Initial Assessment Questions 1. ONSET: "When did the swelling start?" (e.g., minutes, hours, days)     1 week ago worsening now . 2. LOCATION: "What part of the leg is swollen?"  "Are both legs swollen or just one leg?"     Feet and legs up to knees, some bilateral hand swelling 3. SEVERITY: "How bad is the swelling?" (e.g., localized; mild, moderate, severe)  - Localized - small area of swelling localized to one leg  - MILD pedal edema - swelling limited to foot and ankle, pitting edema < 1/4 inch (6 mm) deep, rest and elevation eliminate most or all swelling  - MODERATE edema - swelling of lower leg to knee, pitting edema > 1/4 inch (6 mm) deep, rest and elevation only partially reduce swelling  - SEVERE edema - swelling extends above knee, facial or hand swelling present      moderate 4. REDNESS: "Does the swelling look red or infected?"     Reddish, purplish spots on bilateral ankles 5. PAIN: "Is the swelling painful to touch?" If Yes, ask: "How painful is it?"   (Scale 1-10; mild, moderate or severe)     Mild to moderate 6. FEVER: "Do you have a fever?" If Yes, ask: "What is it, how was it measured, and when did it start?"      na 7. CAUSE: "What do you think is causing the leg swelling?"     Not sure  8. MEDICAL HISTORY: "Do you have a history of heart failure, kidney disease, liver failure, or cancer?"     no 9. RECURRENT SYMPTOM: "Have you had leg swelling before?" If Yes, ask: "When was the last time?" "What happened that time?"     Yes but not this bad 10. OTHER SYMPTOMS: "Do you have any other symptoms?" (e.g., chest pain, difficulty breathing)       Pain in legs . Chest pain at times and shortness of breath with excertion 11. PREGNANCY: "Is there any chance you are pregnant?" "When was your last menstrual period?"       na  Protocols  used: Leg Swelling and Edema-A-AH

## 2020-10-02 NOTE — Telephone Encounter (Signed)
C/o worsening feet and leg swelling up to knees. Some bilateral hand swelling at times. C/o worsening symptoms x 1 week. Pain in legs with swelling. Patient has elevated legs when not at work and swelling decreases. Reports reddish, purplish spots on bilateral ankles. Denies chest pain and SOB at this time. Patient reports chest pain and SOB with exertion. Recent Weight gain , difficulty wearing shoes when swelling occurs. Patient reports she has had feet and legs elevated today and swelling gone down. Instructed patient if symptoms worsen go to ED. Appt scheduled for 10/03/20. Care advise given. Patient verbalized understanding of care advise and to call back or got to ED if symptoms worsen.

## 2020-10-03 ENCOUNTER — Other Ambulatory Visit: Payer: Self-pay

## 2020-10-03 ENCOUNTER — Encounter: Payer: Self-pay | Admitting: Nurse Practitioner

## 2020-10-03 ENCOUNTER — Ambulatory Visit (INDEPENDENT_AMBULATORY_CARE_PROVIDER_SITE_OTHER): Payer: Medicaid Other | Admitting: Nurse Practitioner

## 2020-10-03 VITALS — BP 149/94 | HR 80 | Temp 98.1°F | Ht 68.31 in | Wt 303.2 lb

## 2020-10-03 DIAGNOSIS — M7989 Other specified soft tissue disorders: Secondary | ICD-10-CM

## 2020-10-03 DIAGNOSIS — M79606 Pain in leg, unspecified: Secondary | ICD-10-CM | POA: Diagnosis not present

## 2020-10-03 MED ORDER — FUROSEMIDE 20 MG PO TABS: 20.0000 mg | ORAL_TABLET | Freq: Every day | ORAL | 0 refills | Status: DC

## 2020-10-03 NOTE — Progress Notes (Signed)
BP (!) 149/94   Pulse 80   Temp 98.1 F (36.7 C)   Ht 5' 8.31" (1.735 m)   Wt (!) 303 lb 4 oz (137.6 kg)   SpO2 97%   BMI 45.70 kg/m    Subjective:    Patient ID: Melissa Mendez, female    DOB: January 19, 1991, 30 y.o.   MRN: [id number]  HPI: Melissa Mendez is a 30 y.o. female  Chief Complaint  Patient presents with   Edema    Feet and legs    SWELLING Patient states that she has been having lower extremity edema. It usually resolves over night. In the last week and a half it has worsened. Patient states her SOB is when it is hot outside.  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  Denies any chest pain, back pain, numbness or tingling.  Patient states she has moved out of a house that hold mold in it and she is feeling a lot better. Patient states she has only been sick one time since she moved out.  Prior to that she was vomiting 3-4 times per week.   Relevant past medical, surgical, family and social history reviewed and updated as indicated. Interim medical history since our last visit reviewed. Allergies and medications reviewed and updated.  Review of Systems  Respiratory:  Negative for shortness of breath.   Cardiovascular:  Positive for leg swelling. Negative for chest pain.  Musculoskeletal:  Negative for back pain.  Neurological:  Negative for numbness.   Per HPI unless specifically indicated above     Objective:    BP (!) 149/94   Pulse 80   Temp 98.1 F (36.7 C)   Ht 5' 8.31" (1.735 m)   Wt (!) 303 lb 4 oz (137.6 kg)   SpO2 97%   BMI 45.70 kg/m   Wt Readings from Last 3 Encounters:  10/03/20 (!) 303 lb 4 oz (137.6 kg)  09/02/20 295 lb 6.4 oz (134 kg)  08/27/20 299 lb (135.6 kg)    Physical Exam Vitals and nursing note reviewed.  Constitutional:      General: She is not in acute distress.    Appearance: Normal appearance. She is normal weight. She is not ill-appearing, toxic-appearing or diaphoretic.  HENT:     Head: Normocephalic.      Right Ear: External ear normal.     Left Ear: External ear normal.     Nose: Nose normal.     Mouth/Throat:     Mouth: Mucous membranes are moist.     Pharynx: Oropharynx is clear.  Eyes:     General:        Right eye: No discharge.        Left eye: No discharge.     Extraocular Movements: Extraocular movements intact.     Conjunctiva/sclera: Conjunctivae normal.     Pupils: Pupils are equal, round, and reactive to light.  Cardiovascular:     Rate and Rhythm: Normal rate and regular rhythm.     Heart sounds: No murmur heard. Pulmonary:     Effort: Pulmonary effort is normal. No respiratory distress.     Breath sounds: Normal breath sounds. No wheezing or rales.  Musculoskeletal:     Cervical back: Normal range of motion and neck supple.     Right lower leg: 1+ Pitting Edema present.     Left lower leg: 1+ Pitting Edema present.  Skin:    General: Skin  is warm and dry.     Capillary Refill: Capillary refill takes less than 2 seconds.  Neurological:     General: No focal deficit present.     Mental Status: She is alert and oriented to person, place, and time. Mental status is at baseline.  Psychiatric:        Mood and Affect: Mood normal.        Behavior: Behavior normal.        Thought Content: Thought content normal.        Judgment: Judgment normal.        Assessment & Plan:   Problem List Items Addressed This Visit   None Visit Diagnoses     Pain and swelling of lower extremity, unspecified laterality    -  Primary   Lab work ordered. Lasix daily x14 days. If swelling does not improve will send to Cardiology for further workup.   Relevant Medications   furosemide (LASIX) 20 MG tablet   Other Relevant Orders   Comp Met (CMET)   CBC w/Diff        Follow up plan: Return in about 2 weeks (around 10/17/2020) for lower extremity swelling.   A total of 20 minutes were spent on this encounter today.  When total time is documented, this includes both the  face-to-face and non-face-to-face time personally spent before, during and after the visit on the date of the encounter.

## 2020-10-04 ENCOUNTER — Ambulatory Visit (INDEPENDENT_AMBULATORY_CARE_PROVIDER_SITE_OTHER): Payer: Medicaid Other | Admitting: Nurse Practitioner

## 2020-10-04 ENCOUNTER — Ambulatory Visit: Payer: Self-pay | Admitting: *Deleted

## 2020-10-04 ENCOUNTER — Encounter: Payer: Self-pay | Admitting: Nurse Practitioner

## 2020-10-04 VITALS — BP 128/82 | Temp 99.1°F

## 2020-10-04 DIAGNOSIS — L03115 Cellulitis of right lower limb: Secondary | ICD-10-CM

## 2020-10-04 DIAGNOSIS — M7989 Other specified soft tissue disorders: Secondary | ICD-10-CM | POA: Diagnosis not present

## 2020-10-04 DIAGNOSIS — M79606 Pain in leg, unspecified: Secondary | ICD-10-CM | POA: Diagnosis not present

## 2020-10-04 LAB — COMPREHENSIVE METABOLIC PANEL
ALT: 13 IU/L (ref 0–32)
AST: 14 IU/L (ref 0–40)
Albumin/Globulin Ratio: 1.8 (ref 1.2–2.2)
Albumin: 4.4 g/dL (ref 3.8–4.8)
Alkaline Phosphatase: 66 IU/L (ref 44–121)
BUN/Creatinine Ratio: 11 (ref 9–23)
BUN: 10 mg/dL (ref 6–20)
Bilirubin Total: 0.3 mg/dL (ref 0.0–1.2)
CO2: 21 mmol/L (ref 20–29)
Calcium: 9.6 mg/dL (ref 8.7–10.2)
Chloride: 102 mmol/L (ref 96–106)
Creatinine, Ser: 0.9 mg/dL (ref 0.57–1.00)
Globulin, Total: 2.5 g/dL (ref 1.5–4.5)
Glucose: 104 mg/dL — ABNORMAL HIGH (ref 65–99)
Potassium: 4.8 mmol/L (ref 3.5–5.2)
Sodium: 138 mmol/L (ref 134–144)
Total Protein: 6.9 g/dL (ref 6.0–8.5)
eGFR: 88 mL/min/{1.73_m2} (ref >59)

## 2020-10-04 LAB — CBC WITH DIFFERENTIAL/PLATELET
Basophils Absolute: 0.1 10*3/uL (ref 0.0–0.2)
Basos: 1 %
EOS (ABSOLUTE): 0.5 10*3/uL — ABNORMAL HIGH (ref 0.0–0.4)
Eos: 7 %
Hematocrit: 38.4 % (ref 34.0–46.6)
Hemoglobin: 12.6 g/dL (ref 11.1–15.9)
Immature Grans (Abs): 0 10*3/uL (ref 0.0–0.1)
Immature Granulocytes: 0 %
Lymphocytes Absolute: 2.6 10*3/uL (ref 0.7–3.1)
Lymphs: 37 %
MCH: 27.5 pg (ref 26.6–33.0)
MCHC: 32.8 g/dL (ref 31.5–35.7)
MCV: 84 fL (ref 79–97)
Monocytes Absolute: 0.5 10*3/uL (ref 0.1–0.9)
Monocytes: 7 %
Neutrophils Absolute: 3.4 10*3/uL (ref 1.4–7.0)
Neutrophils: 48 %
Platelets: 275 10*3/uL (ref 150–450)
RBC: 4.58 x10E6/uL (ref 3.77–5.28)
RDW: 12.9 % (ref 11.7–15.4)
WBC: 7 10*3/uL (ref 3.4–10.8)

## 2020-10-04 MED ORDER — DOXYCYCLINE HYCLATE 100 MG PO TABS: 100.0000 mg | ORAL_TABLET | Freq: Two times a day (BID) | ORAL | 0 refills | Status: DC

## 2020-10-04 NOTE — Telephone Encounter (Signed)
Patient notified

## 2020-10-04 NOTE — Telephone Encounter (Signed)
Is patient having any shortness of breath, tongue swelling, difficulty swallowing?  Are the patches raised? Are they spreading?

## 2020-10-04 NOTE — Telephone Encounter (Signed)
Patient called to report she was seen at [facility] yesterday for swelling in feet and ankles and prescribed diuretic and has not started the medication at this time. Now noticed red splotches on ankles and top of feet.  Called patient to review symptoms. C/o after work last night noted a few red spots on ankles when she go home. This am red splotches are raised , bright red, to bilateral ankles and top of feet, little discomfort, itching. Swelling in bilateral feet have decreased. Patient reports she will try to download a picture in her My Chart account. Patient would like to know if PCP can prescribe medication or if OTC medication will help. Denies fever, rash any where else on body, no chest pain, difficulty breathing. Please advise . Care advise given. Patient verbalized understanding of care advise and to call back or go to [facility] or ED if symptoms worsen.

## 2020-10-04 NOTE — Telephone Encounter (Signed)
Please advise pt seen yesterday

## 2020-10-04 NOTE — Telephone Encounter (Signed)
Reason for Disposition  [1] Localized purple or blood-colored spots or dots AND [2] not from injury or friction AND [3] no fever  Answer Assessment - Initial Assessment Questions 1. APPEARANCE of RASH: "Describe the rash."      Red "splotches" raised , itchy, on ankles and top of feet 2. LOCATION: "Where is the rash located?"      Bilateral ankles and top of feet  3. NUMBER: "How many spots are there?"      Na  4. SIZE: "How big are the spots?" (Inches, centimeters or compare to size of a coin)      Splotches  5. ONSET: "When did the rash start?"      Started after work last night and this am worse 6. ITCHING: "Does the rash itch?" If Yes, ask: "How bad is the itch?"  (Scale 0-10; or none, mild, moderate, severe)     Yes mild itching 7. PAIN: "Does the rash hurt?" If Yes, ask: "How bad is the pain?"  (Scale 0-10; or none, mild, moderate, severe)    - NONE (0): no pain    - MILD (1-3): doesn't interfere with normal activities     - MODERATE (4-7): interferes with normal activities or awakens from sleep     - SEVERE (8-10): excruciating pain, unable to do any normal activities    Mild . Little discomfort  8. OTHER SYMPTOMS: "Do you have any other symptoms?" (e.g., fever)     No  9. PREGNANCY: "Is there any chance you are pregnant?" "When was your last menstrual period?"     na  Protocols used: Rash or Redness - Localized-A-AH

## 2020-10-04 NOTE — Telephone Encounter (Signed)
No sob or anything  Was spreading last night but is not now and they are raised.  They are still swollen

## 2020-10-04 NOTE — Progress Notes (Signed)
Hi Satia.  Your lab work looks good.  No evidence to explain your swelling.  I will see you in 2 weeks and see how you are doing.

## 2020-10-04 NOTE — Progress Notes (Signed)
Acute Office Visit  Subjective:    Patient ID: Melissa Mendez, female    DOB: 01/01/1991, 30 y.o.   MRN: [id number]  Chief Complaint  Patient presents with   Rash    Rash on lower legs near swelling that started last night. Right leg is worse than the left     HPI Patient is in today for rash to her lower legs that started last night. She does not remember any insect bites or scratches to the area. Of note, her legs have been swelling off and on for the last few weeks. The swelling has improved some today. She has not started the lasix yet that was prescribed yesterday as she just picked it up  the pharmacy.   RASH  Duration:   started last night   Location: legs , right worse than left Itching: yes Burning: no Redness: yes Oozing: no Scaling: no Blisters: no Painful: yes Fevers: no Change in detergents/soaps/personal care products: no Recent illness: no Recent travel:no History of same: no Context: worse Alleviating factors: nothing Treatments attempted:nothing Shortness of breath: no  Throat/tongue swelling: no Myalgias/arthralgias: no   Past Medical History:  Diagnosis Date   Anxiety    Depression    Fibromyalgia    Fibromyalgia    Gastroparesis    Migraines    Obesity     Past Surgical History:  Procedure Laterality Date   ESOPHAGOGASTRODUODENOSCOPY (EGD) WITH PROPOFOL N/A 08/27/2020   Procedure: ESOPHAGOGASTRODUODENOSCOPY (EGD) WITH PROPOFOL;  Surgeon: Virgel Manifold, MD;  Location: [place];  Service: Endoscopy;  Laterality: N/A;   TONSILLECTOMY AND ADENOIDECTOMY  1993   WISDOM TOOTH EXTRACTION      Family History  Problem Relation Age of Onset   Hypertension Father    Multiple sclerosis Maternal Grandmother    Cancer Maternal Grandfather     Social History   Socioeconomic History   Marital status: Married    Spouse name: Not on file   Number of children: Not on file   Years of education: Not on file   Highest education  level: Not on file  Occupational History   Not on file  Tobacco Use   Smoking status: Every Day    Packs/day: 1.00    Pack years: 0.00    Types: Cigarettes   Smokeless tobacco: Never  Vaping Use   Vaping Use: Never used  Substance and Sexual Activity   Alcohol use: No    Alcohol/week: 0.0 standard drinks   Drug use: No   Sexual activity: Yes    Birth control/protection: None  Other Topics Concern   Not on file  Social History Narrative   Single.   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   Enjoys painting, being with her friends and boyfriend.      Social Determinants of Health   Financial Resource Strain: Not on file  Food Insecurity: Not on file  Transportation Needs: Not on file  Physical Activity: Not on file  Stress: Not on file  Social Connections: Not on file  Intimate Partner Violence: Not on file    Outpatient Medications Prior to Visit  Medication Sig Dispense Refill   cyclobenzaprine (FLEXERIL) 10 MG tablet TAKE 1 TABLET(10 MG) BY MOUTH THREE TIMES DAILY AS NEEDED FOR MUSCLE SPASMS. DO NOT DRIVE WHILE TAKING THIS MEDICATION 30 tablet 0   DULoxetine (CYMBALTA) 30 MG capsule TAKE 2 CAPSULES(60 MG) BY MOUTH DAILY 180 capsule 0  escitalopram (LEXAPRO) 10 MG tablet TAKE 1 TABLET(10 MG) BY MOUTH DAILY 90 tablet 1   furosemide (LASIX) 20 MG tablet Take 1 tablet (20 mg total) by mouth daily. 14 tablet 0   gabapentin (NEURONTIN) 800 MG tablet TAKE 1 TABLET(800 MG) BY MOUTH THREE TIMES DAILY 90 tablet 2   ondansetron (ZOFRAN) 4 MG tablet Take 1 tablet (4 mg total) by mouth every 8 (eight) hours as needed for nausea or vomiting. 20 tablet 0   pantoprazole (PROTONIX) 40 MG tablet TAKE 1 TABLET(40 MG) BY MOUTH TWICE DAILY BEFORE A MEAL 180 tablet 0   Thiamine HCl (VITAMIN B-1) 250 MG tablet Take 250 mg by mouth daily.     TURMERIC PO Take by mouth.     No facility-administered medications prior to visit.    Allergies  Allergen  Reactions   Sulfa Antibiotics Other (See Comments)    Was told as a child that she is allergic to sulfa drugs; reaction unknown    Review of Systems  Constitutional:  Positive for fatigue. Negative for fever.  HENT: Negative.    Eyes: Negative.   Respiratory: Negative.    Cardiovascular: Negative.   Gastrointestinal: Negative.   Genitourinary: Negative.   Musculoskeletal: Negative.   Skin:  Positive for rash (lower legs).  Neurological: Negative.       Objective:    Physical Exam Vitals and nursing note reviewed.  Constitutional:      General: She is not in acute distress.    Appearance: Normal appearance.  HENT:     Head: Normocephalic.  Eyes:     Conjunctiva/sclera: Conjunctivae normal.  Cardiovascular:     Rate and Rhythm: Normal rate and regular rhythm.     Pulses: Normal pulses.     Heart sounds: Normal heart sounds.  Pulmonary:     Effort: Pulmonary effort is normal.     Breath sounds: Normal breath sounds.  Musculoskeletal:     Cervical back: Normal range of motion.     Right lower leg: Edema (1+ to shin) present.     Left lower leg: Edema (1+ to shin) present.  Skin:    General: Skin is warm.     Findings: Erythema (large warm, red area to front of right shin, small warm, red area to left shin) present.  Neurological:     General: No focal deficit present.     Mental Status: She is alert and oriented to person, place, and time.  Psychiatric:        Mood and Affect: Mood normal.        Behavior: Behavior normal.        Thought Content: Thought content normal.        Judgment: Judgment normal.    BP 128/82   Temp 99.1 F (37.3 C)   SpO2 98%  Wt Readings from Last 3 Encounters:  10/03/20 (!) 303 lb 4 oz (137.6 kg)  09/02/20 295 lb 6.4 oz (134 kg)  08/27/20 299 lb (135.6 kg)    Health Maintenance Due  Topic Date Due   COVID-19 Vaccine (2 - Moderna series) 01/09/2020    There are no preventive care reminders to display for this patient.   Lab  Results  Component Value Date   TSH 1.260 07/08/2020   Lab Results  Component Value Date   WBC 7.0 10/03/2020   HGB 12.6 10/03/2020   HCT 38.4 10/03/2020   MCV 84 10/03/2020   PLT 275 10/03/2020   Lab Results  Component Value Date   NA 138 10/03/2020   K 4.8 10/03/2020   CO2 21 10/03/2020   GLUCOSE 104 (H) 10/03/2020   BUN 10 10/03/2020   CREATININE 0.90 10/03/2020   BILITOT 0.3 10/03/2020   ALKPHOS 66 10/03/2020   AST 14 10/03/2020   ALT 13 10/03/2020   PROT 6.9 10/03/2020   ALBUMIN 4.4 10/03/2020   CALCIUM 9.6 10/03/2020   ANIONGAP 5 11/05/2015   EGFR 88 10/03/2020   No results found for: CHOL No results found for: HDL No results found for: LDLCALC No results found for: TRIG No results found for: CHOLHDL No results found for: HGBA1C     Assessment & Plan:   Problem List Items Addressed This Visit   None Visit Diagnoses     Cellulitis of right lower extremity    -  Primary   With erythema, warmth, and swelling will treat with doxycyline. Keep the area clean and dry. Follow up if symptoms worsen or don't improve.    Pain and swelling of lower extremity, unspecified laterality       Take the lasix for 2 weeks as prescribed yesterday. Will see if a BNP can be added on to labs yesterday. Keep f/u appointment in 2 weeks.    Relevant Orders   B Nat Peptide        Meds ordered this encounter  Medications   doxycycline (VIBRA-TABS) 100 MG tablet    Sig: Take 1 tablet (100 mg total) by mouth 2 (two) times daily.    Dispense:  20 tablet    Refill:  0      Charyl Dancer, NP

## 2020-10-05 ENCOUNTER — Encounter: Payer: Self-pay | Admitting: Nurse Practitioner

## 2020-10-16 ENCOUNTER — Other Ambulatory Visit: Payer: Self-pay | Admitting: Nurse Practitioner

## 2020-10-16 DIAGNOSIS — M79606 Pain in leg, unspecified: Secondary | ICD-10-CM

## 2020-10-16 NOTE — Telephone Encounter (Signed)
  Notes to clinic:  review for continued use  Only 14 tabs given    Requested Prescriptions  Pending Prescriptions Disp Refills   furosemide (LASIX) 20 MG tablet [Pharmacy Med Name: FUROSEMIDE 20MG  TABLETS] 14 tablet 0    Sig: TAKE 1 TABLET(20 MG) BY MOUTH DAILY      Cardiovascular:  Diuretics - Loop Passed - 10/16/2020  3:17 AM      Passed - K in normal range and within 360 days    Potassium  Date Value Ref Range Status  10/03/2020 4.8 3.5 - 5.2 mmol/L Final          Passed - Ca in normal range and within 360 days    Calcium  Date Value Ref Range Status  10/03/2020 9.6 8.7 - 10.2 mg/dL Final          Passed - Na in normal range and within 360 days    Sodium  Date Value Ref Range Status  10/03/2020 138 134 - 144 mmol/L Final          Passed - Cr in normal range and within 360 days    Creatinine, Ser  Date Value Ref Range Status  10/03/2020 0.90 0.57 - 1.00 mg/dL Final          Passed - Last BP in normal range    BP Readings from Last 1 Encounters:  10/04/20 128/82          Passed - Valid encounter within last 6 months    Recent Outpatient Visits           1 week ago Cellulitis of right lower extremity   [name] [name], Lauren A, NP   1 week ago Pain and swelling of lower extremity, unspecified laterality   [place], NP   3 weeks ago [place], Karen, NP   3 months ago Anxiety and depression   [facility] Jon Billings, NP   4 months ago Anxiety and depression   [facility] Jon Billings, NP       Future Appointments             Tomorrow Jon Billings, NP [name], [place]   In 1 month Virgel Manifold, MD [place]

## 2020-10-16 NOTE — Telephone Encounter (Signed)
Pt has apt on 10/17/2020

## 2020-10-16 NOTE — Progress Notes (Deleted)
   There were no vitals taken for this visit.   Subjective:    Patient ID: Melissa Mendez, female    DOB: December 03, 1990, 30 y.o.   MRN: [id number]  HPI: [name] is a 30 y.o. female  No chief complaint on file.  EDEMA  Relevant past medical, surgical, family and social history reviewed and updated as indicated. Interim medical history since our last visit reviewed. Allergies and medications reviewed and updated.  Review of Systems  Per HPI unless specifically indicated above     Objective:    There were no vitals taken for this visit.  Wt Readings from Last 3 Encounters:  10/03/20 (!) 303 lb 4 oz (137.6 kg)  09/02/20 295 lb 6.4 oz (134 kg)  08/27/20 299 lb (135.6 kg)    Physical Exam  Results for orders placed or performed in visit on 10/03/20  Comp Met (CMET)  Result Value Ref Range   Glucose 104 (H) 65 - 99 mg/dL   BUN 10 6 - 20 mg/dL   Creatinine, Ser 0.90 0.57 - 1.00 mg/dL   eGFR 88 >59 mL/min/1.73   BUN/Creatinine Ratio 11 9 - 23   Sodium 138 134 - 144 mmol/L   Potassium 4.8 3.5 - 5.2 mmol/L   Chloride 102 96 - 106 mmol/L   CO2 21 20 - 29 mmol/L   Calcium 9.6 8.7 - 10.2 mg/dL   Total Protein 6.9 6.0 - 8.5 g/dL   Albumin 4.4 3.8 - 4.8 g/dL   Globulin, Total 2.5 1.5 - 4.5 g/dL   Albumin/Globulin Ratio 1.8 1.2 - 2.2   Bilirubin Total 0.3 0.0 - 1.2 mg/dL   Alkaline Phosphatase 66 44 - 121 IU/L   AST 14 0 - 40 IU/L   ALT 13 0 - 32 IU/L  CBC w/Diff  Result Value Ref Range   WBC 7.0 3.4 - 10.8 x10E3/uL   RBC 4.58 3.77 - 5.28 x10E6/uL   Hemoglobin 12.6 11.1 - 15.9 g/dL   Hematocrit 38.4 34.0 - 46.6 %   MCV 84 79 - 97 fL   MCH 27.5 26.6 - 33.0 pg   MCHC 32.8 31.5 - 35.7 g/dL   RDW 12.9 11.7 - 15.4 %   Platelets 275 150 - 450 x10E3/uL   Neutrophils 48 Not Estab. %   Lymphs 37 Not Estab. %   Monocytes 7 Not Estab. %   Eos 7 Not Estab. %   Basos 1 Not Estab. %   Neutrophils Absolute 3.4 1.4 - 7.0 x10E3/uL   Lymphocytes Absolute 2.6 0.7 - 3.1 x10E3/uL    Monocytes Absolute 0.5 0.1 - 0.9 x10E3/uL   EOS (ABSOLUTE) 0.5 (H) 0.0 - 0.4 x10E3/uL   Basophils Absolute 0.1 0.0 - 0.2 x10E3/uL   Immature Granulocytes 0 Not Estab. %   Immature Grans (Abs) 0.0 0.0 - 0.1 x10E3/uL      Assessment & Plan:   Problem List Items Addressed This Visit   None Visit Diagnoses     Pain and swelling of lower extremity, unspecified laterality    -  Primary        Follow up plan: No follow-ups on file.

## 2020-10-17 ENCOUNTER — Ambulatory Visit: Payer: Medicaid Other | Admitting: Nurse Practitioner

## 2020-10-29 ENCOUNTER — Other Ambulatory Visit: Payer: Self-pay | Admitting: Nurse Practitioner

## 2020-10-29 NOTE — Telephone Encounter (Signed)
Requested medications are due for refill today yes  Requested medications are on the active medication list yes  Last refill 4/12  Last visit 09/2020  Future visit scheduled no  Notes to clinic Was to see GI prior to refill, she did see GI, unclear if to continue this med, please assess.

## 2020-10-31 NOTE — Telephone Encounter (Signed)
Pt needs apt for this f.u

## 2020-11-05 ENCOUNTER — Telehealth: Payer: Self-pay

## 2020-11-05 NOTE — Telephone Encounter (Signed)
Copied from [place] [id number]. Topic: General - Other >> Nov 05, 2020  2:26 PM Leward Quan A wrote: Reason for CRM: Patient called in to inform Jon Billings that she received a call from Endocrinology that she was referred to but they told her what is needed to be done for her can not be done at that office so she need to be referred someplace else. Patient can be reached at  Ph# [phone]

## 2020-11-05 NOTE — Telephone Encounter (Signed)
Copied from [place] [id number]. Topic: Referral - Question >> Nov 05, 2020  2:13 PM Oneta Rack wrote: Reason for CRM: patient states the initial referral placed for a psychiatrist was out of network therefore was advised the referral coordinator would follow up with another psychiatrist who is in network. Patient would like a follow up call

## 2020-11-07 NOTE — Telephone Encounter (Signed)
Pt has apt on 11/18/2020

## 2020-11-12 ENCOUNTER — Other Ambulatory Visit: Payer: Self-pay | Admitting: Nurse Practitioner

## 2020-11-18 ENCOUNTER — Ambulatory Visit: Payer: Medicaid Other | Admitting: Nurse Practitioner

## 2020-11-18 NOTE — Progress Notes (Deleted)
   There were no vitals taken for this visit.   Subjective:    Patient ID: Melissa Mendez, female    DOB: [birth year], 30 y.o.   MRN: [id number]  HPI: Melissa Mendez is a 30 y.o. female  No chief complaint on file.  GERD GERD control status: {Blank single:19197::"controlled","uncontrolled","better","worse","exacerbated","stable"}Satisfied with current treatment? {Blank single:19197::"yes","no"} Heartburn frequency:  Medication side effects: {Blank single:19197::"yes","no"}  Medication compliance: {Blank multiple:19196::"better","worse","stable","fluctuating"} Previous GERD medications: Antacid use frequency:   Duration:  Nature:  Location:  Heartburn duration:  Alleviatiating factors:   Aggravating factors:  Dysphagia: {Blank single:19197::"yes","no"} Odynophagia:  {Blank single:19197::"yes","no"} Hematemesis: {Blank single:19197::"yes","no"} Blood in stool: {Blank single:19197::"yes","no"} EGD: {Blank single:19197::"yes","no"}  DEPRESSION/ANXIETY   Relevant past medical, surgical, family and social history reviewed and updated as indicated. Interim medical history since our last visit reviewed. Allergies and medications reviewed and updated.  Review of Systems  Per HPI unless specifically indicated above     Objective:    There were no vitals taken for this visit.  Wt Readings from Last 3 Encounters:  10/03/20 (!) 303 lb 4 oz (137.6 kg)  09/02/20 295 lb 6.4 oz (134 kg)  08/27/20 299 lb (135.6 kg)    Physical Exam  Results for orders placed or performed in visit on 10/03/20  Comp Met (CMET)  Result Value Ref Range   Glucose 104 (H) 65 - 99 mg/dL   BUN 10 6 - 20 mg/dL   Creatinine, Ser 0.90 0.57 - 1.00 mg/dL   eGFR 88 >59 mL/min/1.73   BUN/Creatinine Ratio 11 9 - 23   Sodium 138 134 - 144 mmol/L   Potassium 4.8 3.5 - 5.2 mmol/L   Chloride 102 96 - 106 mmol/L   CO2 21 20 - 29 mmol/L   Calcium 9.6 8.7 - 10.2 mg/dL   Total Protein 6.9 6.0 - 8.5 g/dL   Albumin  4.4 3.8 - 4.8 g/dL   Globulin, Total 2.5 1.5 - 4.5 g/dL   Albumin/Globulin Ratio 1.8 1.2 - 2.2   Bilirubin Total 0.3 0.0 - 1.2 mg/dL   Alkaline Phosphatase 66 44 - 121 IU/L   AST 14 0 - 40 IU/L   ALT 13 0 - 32 IU/L  CBC w/Diff  Result Value Ref Range   WBC 7.0 3.4 - 10.8 x10E3/uL   RBC 4.58 3.77 - 5.28 x10E6/uL   Hemoglobin 12.6 11.1 - 15.9 g/dL   Hematocrit 38.4 34.0 - 46.6 %   MCV 84 79 - 97 fL   MCH 27.5 26.6 - 33.0 pg   MCHC 32.8 31.5 - 35.7 g/dL   RDW 12.9 11.7 - 15.4 %   Platelets 275 150 - 450 x10E3/uL   Neutrophils 48 Not Estab. %   Lymphs 37 Not Estab. %   Monocytes 7 Not Estab. %   Eos 7 Not Estab. %   Basos 1 Not Estab. %   Neutrophils Absolute 3.4 1.4 - 7.0 x10E3/uL   Lymphocytes Absolute 2.6 0.7 - 3.1 x10E3/uL   Monocytes Absolute 0.5 0.1 - 0.9 x10E3/uL   EOS (ABSOLUTE) 0.5 (H) 0.0 - 0.4 x10E3/uL   Basophils Absolute 0.1 0.0 - 0.2 x10E3/uL   Immature Granulocytes 0 Not Estab. %   Immature Grans (Abs) 0.0 0.0 - 0.1 x10E3/uL      Assessment & Plan:   Problem List Items Addressed This Visit   None    Follow up plan: No follow-ups on file.

## 2020-11-25 ENCOUNTER — Ambulatory Visit: Payer: Medicaid Other | Admitting: Nurse Practitioner

## 2020-11-25 NOTE — Progress Notes (Deleted)
There were no vitals taken for this visit.   Subjective:    Patient ID: Melissa Mendez, female    DOB: 1990/09/19, 30 y.o.   MRN: [id number]  HPI: Melissa Mendez is a 30 y.o. female  No chief complaint on file.  GERD  FIBROMYALGIA  DEPRESSION/ANXIETY  Relevant past medical, surgical, family and social history reviewed and updated as indicated. Interim medical history since our last visit reviewed. Allergies and medications reviewed and updated.  Review of Systems  Per HPI unless specifically indicated above     Objective:    There were no vitals taken for this visit.  Wt Readings from Last 3 Encounters:  10/03/20 (!) 303 lb 4 oz (137.6 kg)  09/02/20 295 lb 6.4 oz (134 kg)  08/27/20 299 lb (135.6 kg)    Physical Exam  Results for orders placed or performed in visit on 10/03/20  Comp Met (CMET)  Result Value Ref Range   Glucose 104 (H) 65 - 99 mg/dL   BUN 10 6 - 20 mg/dL   Creatinine, Ser 0.90 0.57 - 1.00 mg/dL   eGFR 88 >59 mL/min/1.73   BUN/Creatinine Ratio 11 9 - 23   Sodium 138 134 - 144 mmol/L   Potassium 4.8 3.5 - 5.2 mmol/L   Chloride 102 96 - 106 mmol/L   CO2 21 20 - 29 mmol/L   Calcium 9.6 8.7 - 10.2 mg/dL   Total Protein 6.9 6.0 - 8.5 g/dL   Albumin 4.4 3.8 - 4.8 g/dL   Globulin, Total 2.5 1.5 - 4.5 g/dL   Albumin/Globulin Ratio 1.8 1.2 - 2.2   Bilirubin Total 0.3 0.0 - 1.2 mg/dL   Alkaline Phosphatase 66 44 - 121 IU/L   AST 14 0 - 40 IU/L   ALT 13 0 - 32 IU/L  CBC w/Diff  Result Value Ref Range   WBC 7.0 3.4 - 10.8 x10E3/uL   RBC 4.58 3.77 - 5.28 x10E6/uL   Hemoglobin 12.6 11.1 - 15.9 g/dL   Hematocrit 38.4 34.0 - 46.6 %   MCV 84 79 - 97 fL   MCH 27.5 26.6 - 33.0 pg   MCHC 32.8 31.5 - 35.7 g/dL   RDW 12.9 11.7 - 15.4 %   Platelets 275 150 - 450 x10E3/uL   Neutrophils 48 Not Estab. %   Lymphs 37 Not Estab. %   Monocytes 7 Not Estab. %   Eos 7 Not Estab. %   Basos 1 Not Estab. %   Neutrophils Absolute 3.4 1.4 - 7.0 x10E3/uL   Lymphocytes  Absolute 2.6 0.7 - 3.1 x10E3/uL   Monocytes Absolute 0.5 0.1 - 0.9 x10E3/uL   EOS (ABSOLUTE) 0.5 (H) 0.0 - 0.4 x10E3/uL   Basophils Absolute 0.1 0.0 - 0.2 x10E3/uL   Immature Granulocytes 0 Not Estab. %   Immature Grans (Abs) 0.0 0.0 - 0.1 x10E3/uL      Assessment & Plan:   Problem List Items Addressed This Visit       Digestive   Gastroesophageal reflux disease     Other   Fibromyalgia - Primary   Anxiety and depression     Follow up plan: No follow-ups on file.

## 2020-12-03 ENCOUNTER — Ambulatory Visit: Payer: Medicaid Other | Admitting: Gastroenterology

## 2020-12-03 ENCOUNTER — Encounter: Payer: Self-pay | Admitting: *Deleted

## 2020-12-06 ENCOUNTER — Ambulatory Visit: Payer: Medicaid Other | Admitting: Gastroenterology

## 2020-12-19 ENCOUNTER — Telehealth (INDEPENDENT_AMBULATORY_CARE_PROVIDER_SITE_OTHER): Payer: Medicaid Other | Admitting: Nurse Practitioner

## 2020-12-19 ENCOUNTER — Encounter: Payer: Self-pay | Admitting: Nurse Practitioner

## 2020-12-19 DIAGNOSIS — K219 Gastro-esophageal reflux disease without esophagitis: Secondary | ICD-10-CM | POA: Diagnosis not present

## 2020-12-19 DIAGNOSIS — F32A Depression, unspecified: Secondary | ICD-10-CM

## 2020-12-19 DIAGNOSIS — F419 Anxiety disorder, unspecified: Secondary | ICD-10-CM | POA: Diagnosis not present

## 2020-12-19 MED ORDER — SUCRALFATE 1 G PO TABS: 1.0000 g | ORAL_TABLET | Freq: Three times a day (TID) | ORAL | 1 refills | Status: DC

## 2020-12-19 MED ORDER — PANTOPRAZOLE SODIUM 40 MG PO TBEC: DELAYED_RELEASE_TABLET | ORAL | 1 refills | Status: DC

## 2020-12-19 NOTE — Progress Notes (Signed)
Established Patient Office Visit  Subjective:  Patient ID: Melissa Mendez, female    DOB: [birth year]  Age: 30 y.o. MRN: [id number]  CC:  Chief Complaint  Patient presents with   Depression    Follow up per patient    HPI KATELINE KINKADE presents for follow-up on depression and acid reflux. She states that she takes protonix twice a day for acid reflux and is still having symptoms. She endorses burning in her throat if she doesn't take the medicine at the exact times every day. Denies shortness of breath and chest pain.   She states her depression and anxiety are now well controlled with the addition of lexapro. She denies any side effects of medication, SI/HI. She is satisfied with her current regimen.   Depression screen [facility] 2/9 12/19/2020 09/19/2020 07/08/2020 06/10/2020 02/15/2020  Decreased Interest 1 0 3 1 3   Down, Depressed, Hopeless 0 0 1 2 1   PHQ - 2 Score 1 0 4 3 4   Altered sleeping 1 1 3 3 3   Tired, decreased energy 1 1 3 3 3   Change in appetite 0 0 0 0 0  Feeling bad or failure about yourself  0 1 3 3 1   Trouble concentrating 0 1 3 3 2   Moving slowly or fidgety/restless 0 0 0 0 0  Suicidal thoughts 0 0 0 0 0  PHQ-9 Score 3 4 16 15 13   Difficult doing work/chores Not difficult at all Not difficult at all Very difficult Somewhat difficult Somewhat difficult    Past Medical History:  Diagnosis Date   Anxiety    Depression    Fibromyalgia    Fibromyalgia    Gastroparesis    Migraines    Obesity     Past Surgical History:  Procedure Laterality Date   ESOPHAGOGASTRODUODENOSCOPY (EGD) WITH PROPOFOL N/A 08/27/2020   Procedure: ESOPHAGOGASTRODUODENOSCOPY (EGD) WITH PROPOFOL;  Surgeon: Virgel Manifold, MD;  Location: [facility];  Service: Endoscopy;  Laterality: N/A;   TONSILLECTOMY AND ADENOIDECTOMY  1993   WISDOM TOOTH EXTRACTION      Family History  Problem Relation Age of Onset   Hypertension Father    Multiple sclerosis Maternal Grandmother    Cancer  Maternal Grandfather     Social History   Socioeconomic History   Marital status: Married    Spouse name: Not on file   Number of children: Not on file   Years of education: Not on file   Highest education level: Not on file  Occupational History   Not on file  Tobacco Use   Smoking status: Every Day    Packs/day: 1.00    Types: Cigarettes   Smokeless tobacco: Never  Vaping Use   Vaping Use: Never used  Substance and Sexual Activity   Alcohol use: No    Alcohol/week: 0.0 standard drinks   Drug use: No   Sexual activity: Yes    Birth control/protection: None  Other Topics Concern   Not on file  Social History Narrative   Single.   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   Enjoys painting, being with her friends and boyfriend.      Social Determinants of Health   Financial Resource Strain: Not on file  Food Insecurity: Not on file  Transportation Needs: Not on file  Physical Activity: Not on file  Stress: Not on file  Social Connections: Not on file  Intimate Partner Violence: Not on file  Outpatient Medications Prior to Visit  Medication Sig Dispense Refill   DULoxetine (CYMBALTA) 30 MG capsule TAKE 2 CAPSULES BY MOUTH EVERY DAY 180 capsule 0   escitalopram (LEXAPRO) 10 MG tablet TAKE 1 TABLET(10 MG) BY MOUTH DAILY 90 tablet 1   gabapentin (NEURONTIN) 800 MG tablet TAKE 1 TABLET(800 MG) BY MOUTH THREE TIMES DAILY 90 tablet 2   ondansetron (ZOFRAN) 4 MG tablet Take 1 tablet (4 mg total) by mouth every 8 (eight) hours as needed for nausea or vomiting. 20 tablet 0   Thiamine HCl (VITAMIN B-1) 250 MG tablet Take 250 mg by mouth daily.     doxycycline (VIBRA-TABS) 100 MG tablet Take 1 tablet (100 mg total) by mouth 2 (two) times daily. 20 tablet 0   pantoprazole (PROTONIX) 40 MG tablet TAKE 1 TABLET(40 MG) BY MOUTH TWICE DAILY BEFORE A MEAL 180 tablet 0   cyclobenzaprine (FLEXERIL) 10 MG tablet TAKE 1 TABLET(10 MG) BY MOUTH THREE TIMES  DAILY AS NEEDED FOR MUSCLE SPASMS. DO NOT DRIVE WHILE TAKING THIS MEDICATION (Patient not taking: Reported on 12/19/2020) 30 tablet 0   furosemide (LASIX) 20 MG tablet Take 1 tablet (20 mg total) by mouth daily. (Patient not taking: Reported on 12/19/2020) 14 tablet 0   TURMERIC PO Take by mouth.     No facility-administered medications prior to visit.    Allergies  Allergen Reactions   Sulfa Antibiotics Other (See Comments)    Was told as a child that she is allergic to sulfa drugs; reaction unknown    ROS Review of Systems  Constitutional: Negative.   Respiratory: Negative.    Cardiovascular: Negative.   Gastrointestinal:  Negative for blood in stool.       Acid reflux  Genitourinary: Negative.   Psychiatric/Behavioral: Negative.       Objective:    Physical Exam Vitals and nursing note reviewed.  Pulmonary:     Comments: Able to talk in complete sentences Neurological:     Mental Status: She is oriented to person, place, and time.  Psychiatric:        Thought Content: Thought content normal.    LMP 12/02/2020 (Approximate)  Wt Readings from Last 3 Encounters:  10/03/20 (!) 303 lb 4 oz (137.6 kg)  09/02/20 295 lb 6.4 oz (134 kg)  08/27/20 299 lb (135.6 kg)     Health Maintenance Due  Topic Date Due   COVID-19 Vaccine (2 - Moderna series) 01/09/2020   INFLUENZA VACCINE  Never done    There are no preventive care reminders to display for this patient.  Lab Results  Component Value Date   TSH 1.260 07/08/2020   Lab Results  Component Value Date   WBC 7.0 10/03/2020   HGB 12.6 10/03/2020   HCT 38.4 10/03/2020   MCV 84 10/03/2020   PLT 275 10/03/2020   Lab Results  Component Value Date   NA 138 10/03/2020   K 4.8 10/03/2020   CO2 21 10/03/2020   GLUCOSE 104 (H) 10/03/2020   BUN 10 10/03/2020   CREATININE 0.90 10/03/2020   BILITOT 0.3 10/03/2020   ALKPHOS 66 10/03/2020   AST 14 10/03/2020   ALT 13 10/03/2020   PROT 6.9 10/03/2020   ALBUMIN 4.4  10/03/2020   CALCIUM 9.6 10/03/2020   ANIONGAP 5 11/05/2015   EGFR 88 10/03/2020   No results found for: CHOL No results found for: HDL No results found for: LDLCALC No results found for: TRIG No results found for: CHOLHDL No results  found for: HGBA1C    Assessment & Plan:   Problem List Items Addressed This Visit       Digestive   Gastroesophageal reflux disease - Primary    Chronic, ongoing. Will refill protonix today. With persisting symptoms, will trial carafate QID. Information given on diet and lifestyle treatments. Follow up in 1 month.       Relevant Medications   pantoprazole (PROTONIX) 40 MG tablet   sucralfate (CARAFATE) 1 g tablet     Other   Anxiety and depression    Chronic, well controlled. No side effects from medication. PHQ9 is a 3 today. Discussed the increased risk of serotonin syndrome being on both cymbalta and lexapro. She states that she would like to continue this regimen as it is really helping her symptoms. Discussed potential symptoms of serotonin syndrome and to call office or go to ER if these develop. Follow-up in 6 months.        Meds ordered this encounter  Medications   pantoprazole (PROTONIX) 40 MG tablet    Sig: TAKE 1 TABLET(40 MG) BY MOUTH TWICE DAILY BEFORE A MEAL    Dispense:  180 tablet    Refill:  1   sucralfate (CARAFATE) 1 g tablet    Sig: Take 1 tablet (1 g total) by mouth 4 (four) times daily -  with meals and at bedtime.    Dispense:  120 tablet    Refill:  1    Follow-up: No follow-ups on file.    This visit was completed via telephone due to the restrictions of the COVID-19 pandemic. All issues as above were discussed and addressed but no physical exam was performed. If it was felt that the patient should be evaluated in the office, they were directed there. The patient verbally consented to this visit. Patient was unable to complete an audio/visual visit due to Technical difficulties", "Lack of internet. Due to the  catastrophic nature of the COVID-19 pandemic, this visit was done through audio contact only. Location of the patient: home Location of the provider: work Those involved with this call:  Provider: Vance Peper, DNP CMA: Yvonna Alanis, [place] Desk/Registration:  Elizabeth Palau   Time spent on call:  10 minutes on the phone discussing health concerns. 10 minutes total spent in review of patient's record and preparation of their chart.   Charyl Dancer, NP

## 2020-12-19 NOTE — Assessment & Plan Note (Signed)
Chronic, ongoing. Will refill protonix today. With persisting symptoms, will trial carafate QID. Information given on diet and lifestyle treatments. Follow up in 1 month.

## 2020-12-19 NOTE — Assessment & Plan Note (Signed)
Chronic, well controlled. No side effects from medication. PHQ9 is a 3 today. Discussed the increased risk of serotonin syndrome being on both cymbalta and lexapro. She states that she would like to continue this regimen as it is really helping her symptoms. Discussed potential symptoms of serotonin syndrome and to call office or go to ER if these develop. Follow-up in 6 months.

## 2021-01-15 NOTE — Progress Notes (Deleted)
There were no vitals taken for this visit.   Subjective:    Patient ID: Melissa Mendez, female    DOB: 07/31/1990, 29 y.o.   MRN: [id number]  HPI: Melissa Mendez is a 29 y.o. female  No chief complaint on file.  GERD GERD control status: {Blank single:19197::"controlled","uncontrolled","better","worse","exacerbated","stable"}Satisfied with current treatment? {Blank single:19197::"yes","no"} Heartburn frequency:  Medication side effects: {Blank single:19197::"yes","no"}  Medication compliance: {Blank multiple:19196::"better","worse","stable","fluctuating"} Previous GERD medications: Antacid use frequency:   Duration:  Nature:  Location:  Heartburn duration:  Alleviatiating factors:   Aggravating factors:  Dysphagia: {Blank single:19197::"yes","no"} Odynophagia:  {Blank single:19197::"yes","no"} Hematemesis: {Blank single:19197::"yes","no"} Blood in stool: {Blank single:19197::"yes","no"} EGD: {Blank single:19197::"yes","no"}  Relevant past medical, surgical, family and social history reviewed and updated as indicated. Interim medical history since our last visit reviewed. Allergies and medications reviewed and updated.  Review of Systems  Per HPI unless specifically indicated above     Objective:    There were no vitals taken for this visit.  Wt Readings from Last 3 Encounters:  10/03/20 (!) 303 lb 4 oz (137.6 kg)  09/02/20 295 lb 6.4 oz (134 kg)  08/27/20 299 lb (135.6 kg)    Physical Exam  Results for orders placed or performed in visit on 10/03/20  Comp Met (CMET)  Result Value Ref Range   Glucose 104 (H) 65 - 99 mg/dL   BUN 10 6 - 20 mg/dL   Creatinine, Ser 0.90 0.57 - 1.00 mg/dL   eGFR 88 >59 mL/min/1.73   BUN/Creatinine Ratio 11 9 - 23   Sodium 138 134 - 144 mmol/L   Potassium 4.8 3.5 - 5.2 mmol/L   Chloride 102 96 - 106 mmol/L   CO2 21 20 - 29 mmol/L   Calcium 9.6 8.7 - 10.2 mg/dL   Total Protein 6.9 6.0 - 8.5 g/dL   Albumin 4.4 3.8 - 4.8 g/dL    Globulin, Total 2.5 1.5 - 4.5 g/dL   Albumin/Globulin Ratio 1.8 1.2 - 2.2   Bilirubin Total 0.3 0.0 - 1.2 mg/dL   Alkaline Phosphatase 66 44 - 121 IU/L   AST 14 0 - 40 IU/L   ALT 13 0 - 32 IU/L  CBC w/Diff  Result Value Ref Range   WBC 7.0 3.4 - 10.8 x10E3/uL   RBC 4.58 3.77 - 5.28 x10E6/uL   Hemoglobin 12.6 11.1 - 15.9 g/dL   Hematocrit 38.4 34.0 - 46.6 %   MCV 84 79 - 97 fL   MCH 27.5 26.6 - 33.0 pg   MCHC 32.8 31.5 - 35.7 g/dL   RDW 12.9 11.7 - 15.4 %   Platelets 275 150 - 450 x10E3/uL   Neutrophils 48 Not Estab. %   Lymphs 37 Not Estab. %   Monocytes 7 Not Estab. %   Eos 7 Not Estab. %   Basos 1 Not Estab. %   Neutrophils Absolute 3.4 1.4 - 7.0 x10E3/uL   Lymphocytes Absolute 2.6 0.7 - 3.1 x10E3/uL   Monocytes Absolute 0.5 0.1 - 0.9 x10E3/uL   EOS (ABSOLUTE) 0.5 (H) 0.0 - 0.4 x10E3/uL   Basophils Absolute 0.1 0.0 - 0.2 x10E3/uL   Immature Granulocytes 0 Not Estab. %   Immature Grans (Abs) 0.0 0.0 - 0.1 x10E3/uL      Assessment & Plan:   Problem List Items Addressed This Visit       Digestive   Gastroesophageal reflux disease - Primary     Follow up plan: No follow-ups on file.       

## 2021-01-16 ENCOUNTER — Ambulatory Visit: Payer: Medicaid Other | Admitting: Nurse Practitioner

## 2021-01-16 DIAGNOSIS — K219 Gastro-esophageal reflux disease without esophagitis: Secondary | ICD-10-CM

## 2021-03-06 ENCOUNTER — Ambulatory Visit: Payer: Self-pay | Admitting: *Deleted

## 2021-03-06 NOTE — Telephone Encounter (Signed)
Per agent:    "Patient called in and scheduled an appointment with her provider about her medication causing her to cramp up. She stated that she have discussed this with her provider previously but was placed on a second Antidepressant medication and that is what is causing these issues. Say that she was told that her serotonin levels are too high and that's what's causing this issue. Please advise Ph# [phone] "  Pt reports cramping unresolved States was told "Serotonin level was too high and probably causing the cramping." Pt states started when she began Lexapro. Requesting an alternate med "Or to just just Lexapro." Last took yesterday. Also requesting to put put on Adderall. States was on in college "It really helped. Reports having difficulty focusing at workplace. Advised would need appt. Pt has appt already secured for 03/10/21  Please advise: [phone]       Reason for Disposition  Caller wants to use a complementary or alternative medicine  Answer Assessment - Initial Assessment Questions 1. NAME of MEDICATION: "What medicine are you calling about?"     Lexapro 2. QUESTION: "What is your question?" (e.g., double dose of medicine, side effect)     Alternate med 3. PRESCRIBING HCP: "Who prescribed it?" Reason: if prescribed by specialist, call should be referred to that group.     *No Answer* 4. SYMPTOMS: "Do you have any symptoms?"     *No Answer* 5. SEVERITY: If symptoms are present, ask "Are they mild, moderate or severe?"     *No Answer* 6. PREGNANCY:  "Is there any chance that you are pregnant?" "When was your last menstrual period?"     *No Answer*  Protocols used: Medication Question Call-A-AH

## 2021-03-06 NOTE — Telephone Encounter (Signed)
Patient called in and scheduled an appointment with her provider about her medication causing her to cramp up. She stated that she have discussed this with her provider previously but was placed on a second Antidepressant medication and that is what is causing these issues. Say that she was told that her serotonin levels are too high and that's what's causing this issue. Please advise Ph# [phone]   Left VM for pt to call back to discuss issue.

## 2021-03-07 NOTE — Progress Notes (Deleted)
   There were no vitals taken for this visit.   Subjective:    Patient ID: Melissa Mendez, female    DOB: Nov 04, 1990, 30 y.o.   MRN: [id number]  HPI: Melissa Mendez is a 30 y.o. female  No chief complaint on file.   Relevant past medical, surgical, family and social history reviewed and updated as indicated. Interim medical history since our last visit reviewed. Allergies and medications reviewed and updated.  Review of Systems  Per HPI unless specifically indicated above     Objective:    There were no vitals taken for this visit.  Wt Readings from Last 3 Encounters:  10/03/20 (!) 303 lb 4 oz (137.6 kg)  09/02/20 295 lb 6.4 oz (134 kg)  08/27/20 299 lb (135.6 kg)    Physical Exam  Results for orders placed or performed in visit on 10/03/20  Comp Met (CMET)  Result Value Ref Range   Glucose 104 (H) 65 - 99 mg/dL   BUN 10 6 - 20 mg/dL   Creatinine, Ser 0.90 0.57 - 1.00 mg/dL   eGFR 88 >59 mL/min/1.73   BUN/Creatinine Ratio 11 9 - 23   Sodium 138 134 - 144 mmol/L   Potassium 4.8 3.5 - 5.2 mmol/L   Chloride 102 96 - 106 mmol/L   CO2 21 20 - 29 mmol/L   Calcium 9.6 8.7 - 10.2 mg/dL   Total Protein 6.9 6.0 - 8.5 g/dL   Albumin 4.4 3.8 - 4.8 g/dL   Globulin, Total 2.5 1.5 - 4.5 g/dL   Albumin/Globulin Ratio 1.8 1.2 - 2.2   Bilirubin Total 0.3 0.0 - 1.2 mg/dL   Alkaline Phosphatase 66 44 - 121 IU/L   AST 14 0 - 40 IU/L   ALT 13 0 - 32 IU/L  CBC w/Diff  Result Value Ref Range   WBC 7.0 3.4 - 10.8 x10E3/uL   RBC 4.58 3.77 - 5.28 x10E6/uL   Hemoglobin 12.6 11.1 - 15.9 g/dL   Hematocrit 38.4 34.0 - 46.6 %   MCV 84 79 - 97 fL   MCH 27.5 26.6 - 33.0 pg   MCHC 32.8 31.5 - 35.7 g/dL   RDW 12.9 11.7 - 15.4 %   Platelets 275 150 - 450 x10E3/uL   Neutrophils 48 Not Estab. %   Lymphs 37 Not Estab. %   Monocytes 7 Not Estab. %   Eos 7 Not Estab. %   Basos 1 Not Estab. %   Neutrophils Absolute 3.4 1.4 - 7.0 x10E3/uL   Lymphocytes Absolute 2.6 0.7 - 3.1 x10E3/uL    Monocytes Absolute 0.5 0.1 - 0.9 x10E3/uL   EOS (ABSOLUTE) 0.5 (H) 0.0 - 0.4 x10E3/uL   Basophils Absolute 0.1 0.0 - 0.2 x10E3/uL   Immature Granulocytes 0 Not Estab. %   Immature Grans (Abs) 0.0 0.0 - 0.1 x10E3/uL      Assessment & Plan:   Problem List Items Addressed This Visit   None    Follow up plan: No follow-ups on file.

## 2021-03-10 ENCOUNTER — Ambulatory Visit: Payer: Self-pay | Admitting: Nurse Practitioner

## 2021-03-20 ENCOUNTER — Ambulatory Visit: Payer: Medicaid Other | Admitting: Gastroenterology

## 2021-03-21 ENCOUNTER — Encounter: Payer: Self-pay | Admitting: Gastroenterology

## 2021-03-25 ENCOUNTER — Other Ambulatory Visit: Payer: Self-pay | Admitting: Nurse Practitioner

## 2021-03-25 NOTE — Telephone Encounter (Signed)
Requested Prescriptions  Pending Prescriptions Disp Refills  . sucralfate (CARAFATE) 1 g tablet [Pharmacy Med Name: SUCRALFATE 1GM TABLETS] 120 tablet 1    Sig: TAKE 1 TABLET(1 GRAM) BY MOUTH FOUR TIMES DAILY WITH MEALS AND AT BEDTIME     Gastroenterology: Antiacids Passed - 03/25/2021  7:11 AM      Passed - Valid encounter within last 12 months    Recent Outpatient Visits          3 months ago Gastroesophageal reflux disease, unspecified whether esophagitis present   [place], Lauren A, NP   5 months ago Cellulitis of right lower extremity   [place], Lauren A, NP   5 months ago Pain and swelling of lower extremity, unspecified laterality   [place], NP   6 months ago [place], NP   8 months ago Anxiety and depression   [facility] Jon Billings, NP

## 2021-04-24 ENCOUNTER — Other Ambulatory Visit: Payer: Self-pay | Admitting: Nurse Practitioner

## 2021-04-24 DIAGNOSIS — F32A Depression, unspecified: Secondary | ICD-10-CM

## 2021-04-24 DIAGNOSIS — M797 Fibromyalgia: Secondary | ICD-10-CM

## 2021-04-24 NOTE — Telephone Encounter (Signed)
Requested Prescriptions  Pending Prescriptions Disp Refills   escitalopram (LEXAPRO) 10 MG tablet [Pharmacy Med Name: ESCITALOPRAM 10MG  TABLETS] 60 tablet 0    Sig: TAKE 1 TABLET(10 MG) BY MOUTH DAILY     Psychiatry:  Antidepressants - SSRI Passed - 04/24/2021  8:28 AM      Passed - Completed PHQ-2 or PHQ-9 in the last 360 days      Passed - Valid encounter within last 6 months    Recent Outpatient Visits          4 months ago Gastroesophageal reflux disease, unspecified whether esophagitis present   [place], Lauren A, NP   6 months ago Cellulitis of right lower extremity   [place], Lauren A, NP   6 months ago Pain and swelling of lower extremity, unspecified laterality   [place], NP   7 months ago [place], Santiago Glad, NP   9 months ago Anxiety and depression   [facility] [place], Santiago Glad, NP              DULoxetine (CYMBALTA) 30 MG capsule [Pharmacy Med Name: DULOXETINE DR 30MG  CAPSULES] 120 capsule 0    Sig: TAKE 2 [place]     Psychiatry: Antidepressants - SNRI Passed - 04/24/2021  8:28 AM      Passed - Completed PHQ-2 or PHQ-9 in the last 360 days      Passed - Last BP in normal range    BP Readings from Last 1 Encounters:  10/04/20 128/82         Passed - Valid encounter within last 6 months    Recent Outpatient Visits          4 months ago Gastroesophageal reflux disease, unspecified whether esophagitis present   [place], Lauren A, NP   6 months ago Cellulitis of right lower extremity   [name] [name], Lauren A, NP   6 months ago Pain and swelling of lower extremity, unspecified laterality   [place], NP   7 months ago [place], NP   9 months ago Anxiety and depression   [facility] Jon Billings, NP

## 2021-05-05 ENCOUNTER — Telehealth (INDEPENDENT_AMBULATORY_CARE_PROVIDER_SITE_OTHER): Payer: Medicaid Other | Admitting: Nurse Practitioner

## 2021-05-05 ENCOUNTER — Encounter: Payer: Self-pay | Admitting: Nurse Practitioner

## 2021-05-05 DIAGNOSIS — Z7689 Persons encountering health services in other specified circumstances: Secondary | ICD-10-CM

## 2021-05-05 DIAGNOSIS — R41840 Attention and concentration deficit: Secondary | ICD-10-CM

## 2021-05-05 MED ORDER — ATOMOXETINE HCL 10 MG PO CAPS: 10.0000 mg | ORAL_CAPSULE | Freq: Every day | ORAL | 0 refills | Status: DC

## 2021-05-05 MED ORDER — SAXENDA 18 MG/3ML ~~LOC~~ SOPN: 0.6000 mg | PEN_INJECTOR | Freq: Every day | SUBCUTANEOUS | 0 refills | Status: DC

## 2021-05-05 NOTE — Progress Notes (Signed)
There were no vitals taken for this visit.   Subjective:    Patient ID: Melissa Mendez, female    DOB: 1991/01/24, 31 y.o.   MRN: [id number]  HPI: Melissa Mendez is a 31 y.o. female  Chief Complaint  Patient presents with   Weight Loss   Concentration   WEIGHT LOSS Patient states she moved out of a house that had mold in it and she stopped throwing up immediately. She has gained a lot of weight and would like to try something for weight loss.   CONCENTRATION Patient has had trouble concentrating recently.  States she has been on Adderall in the past. Would like to discuss going back on it or having something similar to help her concentrate.   Relevant past medical, surgical, family and social history reviewed and updated as indicated. Interim medical history since our last visit reviewed. Allergies and medications reviewed and updated.  Review of Systems  Constitutional:  Positive for unexpected weight change.  Psychiatric/Behavioral:  Positive for decreased concentration.    Per HPI unless specifically indicated above     Objective:    There were no vitals taken for this visit.  Wt Readings from Last 3 Encounters:  10/03/20 (!) 303 lb 4 oz (137.6 kg)  09/02/20 295 lb 6.4 oz (134 kg)  08/27/20 299 lb (135.6 kg)    Physical Exam Vitals and nursing note reviewed.  Constitutional:      General: She is not in acute distress.    Appearance: She is not ill-appearing.  HENT:     Head: Normocephalic.     Right Ear: Hearing normal.     Left Ear: Hearing normal.     Nose: Nose normal.  Pulmonary:     Effort: Pulmonary effort is normal. No respiratory distress.  Neurological:     Mental Status: She is alert.  Psychiatric:        Mood and Affect: Mood normal.        Behavior: Behavior normal.        Thought Content: Thought content normal.        Judgment: Judgment normal.    Results for orders placed or performed in visit on 10/03/20  Comp Met (CMET)  Result Value  Ref Range   Glucose 104 (H) 65 - 99 mg/dL   BUN 10 6 - 20 mg/dL   Creatinine, Ser 0.90 0.57 - 1.00 mg/dL   eGFR 88 >59 mL/min/1.73   BUN/Creatinine Ratio 11 9 - 23   Sodium 138 134 - 144 mmol/L   Potassium 4.8 3.5 - 5.2 mmol/L   Chloride 102 96 - 106 mmol/L   CO2 21 20 - 29 mmol/L   Calcium 9.6 8.7 - 10.2 mg/dL   Total Protein 6.9 6.0 - 8.5 g/dL   Albumin 4.4 3.8 - 4.8 g/dL   Globulin, Total 2.5 1.5 - 4.5 g/dL   Albumin/Globulin Ratio 1.8 1.2 - 2.2   Bilirubin Total 0.3 0.0 - 1.2 mg/dL   Alkaline Phosphatase 66 44 - 121 IU/L   AST 14 0 - 40 IU/L   ALT 13 0 - 32 IU/L  CBC w/Diff  Result Value Ref Range   WBC 7.0 3.4 - 10.8 x10E3/uL   RBC 4.58 3.77 - 5.28 x10E6/uL   Hemoglobin 12.6 11.1 - 15.9 g/dL   Hematocrit 38.4 34.0 - 46.6 %   MCV 84 79 - 97 fL   MCH 27.5 26.6 - 33.0 pg   MCHC 32.8 31.5 -  35.7 g/dL   RDW 12.9 11.7 - 15.4 %   Platelets 275 150 - 450 x10E3/uL   Neutrophils 48 Not Estab. %   Lymphs 37 Not Estab. %   Monocytes 7 Not Estab. %   Eos 7 Not Estab. %   Basos 1 Not Estab. %   Neutrophils Absolute 3.4 1.4 - 7.0 x10E3/uL   Lymphocytes Absolute 2.6 0.7 - 3.1 x10E3/uL   Monocytes Absolute 0.5 0.1 - 0.9 x10E3/uL   EOS (ABSOLUTE) 0.5 (H) 0.0 - 0.4 x10E3/uL   Basophils Absolute 0.1 0.0 - 0.2 x10E3/uL   Immature Granulocytes 0 Not Estab. %   Immature Grans (Abs) 0.0 0.0 - 0.1 x10E3/uL      Assessment & Plan:   Problem List Items Addressed This Visit   None Visit Diagnoses     Encounter for weight management    -  Primary   Patient would like to start [place] for weight loss. Discussed side effects and benefits of medication with patient during visit. FU in 1 month for reevaluation   Concentration deficit       Has been on adderall. [place] sent. If this does not help her symptoms will send to psychiatry for evaluation and possibly restarting adderall. FU in 1 month.        Follow up plan: Return in about 1 month (around 06/05/2021) for Weight  Managment.   This visit was completed via MyChart due to the restrictions of the COVID-19 pandemic. All issues as above were discussed and addressed. Physical exam was done as above through visual confirmation on MyChart. If it was felt that the patient should be evaluated in the office, they were directed there. The patient verbally consented to this visit. Location of the patient: Home Location of the provider: Office Those involved with this call:  Provider: Jon Billings, NP CMA: Yvonna Alanis, [place] Desk/Registration: Myrlene Broker This encounter was conducted via video.  I spent 20 dedicated to the care of this patient on the date of this encounter to include previsit review of 30, face to face time with the patient, and post visit ordering of testing.

## 2021-05-06 ENCOUNTER — Telehealth: Payer: Self-pay | Admitting: Nurse Practitioner

## 2021-05-06 NOTE — Telephone Encounter (Signed)
Left message for pt to call back to schedule follow up appt with Santiago Glad. "Return in about 1 month (around 06/05/2021) for Weight Managment."

## 2021-05-06 NOTE — Progress Notes (Signed)
Left message asking pt to call back to schedule an appt.

## 2021-05-07 ENCOUNTER — Telehealth: Payer: Self-pay | Admitting: Nurse Practitioner

## 2021-05-07 NOTE — Telephone Encounter (Signed)
Pt called in for assistance. Pt says that she was told by her pharmacy that Rx Liraglutide -Weight Management ([place]) 18 MG/3ML SOPN  is requiring a PA. Pharmacy has faxed over information.    Pt also had a  Question as to what time of the day should she take the medication?    Please assist pt further.

## 2021-05-08 NOTE — Progress Notes (Signed)
Scheduled follow up appointment

## 2021-05-09 NOTE — Telephone Encounter (Signed)
PA for Saxenda initiated and submitted via Cover My Meds. Key: [id number]

## 2021-05-13 ENCOUNTER — Other Ambulatory Visit: Payer: Self-pay | Admitting: Nurse Practitioner

## 2021-05-13 DIAGNOSIS — F32A Depression, unspecified: Secondary | ICD-10-CM

## 2021-05-13 DIAGNOSIS — M797 Fibromyalgia: Secondary | ICD-10-CM

## 2021-05-13 NOTE — Telephone Encounter (Signed)
Requested Prescriptions  Pending Prescriptions Disp Refills   gabapentin (NEURONTIN) 800 MG tablet [Pharmacy Med Name: GABAPENTIN 800MG  TABLETS] 90 tablet 2    Sig: TAKE 1 TABLET(800 MG) BY MOUTH THREE TIMES DAILY     Neurology: Anticonvulsants - gabapentin Passed - 05/13/2021  5:14 PM      Passed - Valid encounter within last 12 months    Recent Outpatient Visits          1 week ago Encounter for weight management   [facility] Jon Billings, NP   4 months ago Gastroesophageal reflux disease, unspecified whether esophagitis present   [place], Lauren A, NP   7 months ago Cellulitis of right lower extremity   [place], Lauren A, NP   7 months ago Pain and swelling of lower extremity, unspecified laterality   [place], NP   7 months ago [place], NP      Future Appointments            In 3 weeks Jon Billings, NP [facility], [place]

## 2021-05-13 NOTE — Telephone Encounter (Signed)
When I filled out the information it did not give other weight loss medication options. Patient should call her insurance and see if weight loss meidcations are covered.

## 2021-05-13 NOTE — Telephone Encounter (Signed)
PA denied for Saxenda. Anything else we can try or do before call is placed to the patient?

## 2021-05-14 NOTE — Telephone Encounter (Signed)
Patient made aware of PA decision for Saxenda and will try to give her insurance company a call. Patient also wanted to know if she used a discount card like GoodRx if it would be feasible to buy out of pocket. Informed patient to go to their web site and check

## 2021-05-23 ENCOUNTER — Ambulatory Visit: Payer: Self-pay | Admitting: *Deleted

## 2021-05-23 MED ORDER — VALACYCLOVIR HCL 1 G PO TABS: 1000.0000 mg | ORAL_TABLET | Freq: Two times a day (BID) | ORAL | 0 refills | Status: AC

## 2021-05-23 NOTE — Telephone Encounter (Signed)
Routing to provider to advise.  

## 2021-05-23 NOTE — Telephone Encounter (Signed)
Summary: medication refill needed doesnt know name   Pt is needing a refill on a medication she was prescribed for herpes but doesnt know the name of it / needs it to go to [facility] in [place]/ please advise        Chief Complaint: requesting medication possible valtrex for "herpes sore" . Has been prescribed prior unsure of refills. Symptoms: noted sore to vaginal area, and requesting medication before it worsens  Frequency: noted yesterday Pertinent Negatives: Patient denies fever, drainage. Disposition: [] ED /[] Urgent Care (no appt availability in office) / [] Appointment(In office/virtual)/ []  [place] [facility]/ [x] Home Care/ [] Refused Recommended Disposition /[] [place] [facility]/ []  Follow-up with PCP Additional Notes:  Please advise if medication valtrex can be renewed due to being expired. Patient reports she takes medication when out break noted and has been seen by PCP before. Please advise if appt needed and if medication can be prescribed and sent to [facility] in [place].       Reason for Disposition  Genital area looks infected (e.g., draining sore, spreading redness)  Answer Assessment - Initial Assessment Questions 1. MAIN SYMPTOM: "What is the main symptom or problem?"  (e.g., penis discharge, vaginal discharge)     Breakout of vaginal sore 2. ONSET: "When did sore   start?"     Yesterday  3. OTHER SYMPTOMS: "Do you have any other symptoms?" (e.g., fever, rash)     No  Protocols used: STI Guideline Selection-A-AH, Vaginal Discharge-A-AH

## 2021-06-06 ENCOUNTER — Ambulatory Visit: Payer: Medicaid Other | Admitting: Nurse Practitioner

## 2021-06-08 ENCOUNTER — Other Ambulatory Visit: Payer: Self-pay | Admitting: Nurse Practitioner

## 2021-06-09 NOTE — Telephone Encounter (Signed)
Requested medication (s) are due for refill today: yes  Requested medication (s) are on the active medication list: yes  Last refill:  05/05/21 #30 with 0 RF  Future visit scheduled: no, canceled yesterday's appt. No upcoming appt scheduled.  Notes to clinic:  This medication can not be delegated, please assess.    Requested Prescriptions  Pending Prescriptions Disp Refills   atomoxetine (STRATTERA) 10 MG capsule [Pharmacy Med Name: ATOMOXETINE 10MG  CAPSULES] 30 capsule 0    Sig: TAKE 1 CAPSULE(10 MG) BY MOUTH DAILY     Not Delegated - Psychiatry: Norepinephrine Reuptake Inhibitor Failed - 06/08/2021  8:02 PM      Failed - This refill cannot be delegated      Passed - Completed PHQ-2 or PHQ-9 in the last 360 days      Passed - Last BP in normal range    BP Readings from Last 1 Encounters:  10/04/20 128/82          Passed - Last Heart Rate in normal range    Pulse Readings from Last 1 Encounters:  10/03/20 80          Passed - Valid encounter within last 6 months    Recent Outpatient Visits           1 month ago Encounter for weight management   [facility] Jon Billings, NP   5 months ago Gastroesophageal reflux disease, unspecified whether esophagitis present   [facility], Lauren A, NP   8 months ago Cellulitis of right lower extremity   [place], Lauren A, NP   8 months ago Pain and swelling of lower extremity, unspecified laterality   [place], NP   8 months ago [place] Jon Billings, NP

## 2021-06-18 ENCOUNTER — Telehealth: Payer: Self-pay | Admitting: Nurse Practitioner

## 2021-06-18 NOTE — Telephone Encounter (Signed)
Medication Refill - Medication: flexeril and medicaid will cover nicotine patches but in script form  ? ?Has the patient contacted their pharmacy? Yes and to contact PCP office because no refills on file  ? ?(Agent: If yes, when and what did the pharmacy advise?) ? ?Preferred Pharmacy (with phone number or street name):  ?[facility] [place], [place] AT [facility] [address] Phone:  [phone]  ?Fax:  [phone]  ?  ? ? ?Has the patient been seen for an appointment in the last year OR does the patient have an upcoming appointment? Yes.   ? ?Agent: Please be advised that RX refills may take up to 3 business days. We ask that you follow-up with your pharmacy. ?

## 2021-06-19 MED ORDER — CYCLOBENZAPRINE HCL 10 MG PO TABS: 10.0000 mg | ORAL_TABLET | Freq: Three times a day (TID) | ORAL | 0 refills | Status: DC | PRN

## 2021-07-06 ENCOUNTER — Other Ambulatory Visit: Payer: Self-pay | Admitting: Nurse Practitioner

## 2021-07-08 NOTE — Telephone Encounter (Signed)
Requested Prescriptions  ?Pending Prescriptions Disp Refills  ?? DULoxetine (CYMBALTA) 30 MG capsule [Pharmacy Med Name: DULOXETINE DR 30MG CAPSULES] 120 capsule 0  ?  Sig: TAKE 2 CAPSULES BY MOUTH EVERY DAY  ?  ? Psychiatry: Antidepressants - SNRI - duloxetine Passed - 07/06/2021  4:20 PM  ?  ?  Passed - Cr in normal range and within 360 days  ?  Creatinine, Ser  ?Date Value Ref Range Status  ?10/03/2020 0.90 0.57 - 1.00 mg/dL Final  ?   ?  ?  Passed - eGFR is 30 or above and within 360 days  ?  GFR calc Af Amer  ?Date Value Ref Range Status  ?12/01/2019 90 >59 mL/min/1.73 Final  ?  Comment:  ?  **[facility] currently reports eGFR in compliance with the current** ?  recommendations of the [employer]. [facility] will ?  update reporting as new guidelines are published from the NKF-ASN ?  Task force. ?  ? ?GFR calc non Af Amer  ?Date Value Ref Range Status  ?12/01/2019 78 >59 mL/min/1.73 Final  ? ?eGFR  ?Date Value Ref Range Status  ?10/03/2020 88 >59 mL/min/1.73 Final  ?   ?  ?  Passed - Completed PHQ-2 or PHQ-9 in the last 360 days  ?  ?  Passed - Last BP in normal range  ?  BP Readings from Last 1 Encounters:  ?10/04/20 128/82  ?   ?  ?  Passed - Valid encounter within last 6 months  ?  Recent Outpatient Visits   ?      ? 2 months ago Encounter for weight management  ? [place], NP  ? 6 months ago Gastroesophageal reflux disease, unspecified whether esophagitis present  ? [place], Lauren A, NP  ? 9 months ago Cellulitis of right lower extremity  ? [place], Lauren A, NP  ? 9 months ago Pain and swelling of lower extremity, unspecified laterality  ? [place], NP  ? 9 months ago Fibromyalgia  ? [facility] Jon Billings, NP  ?  ?  ? ?  ?  ?  ? ?

## 2021-08-01 ENCOUNTER — Ambulatory Visit: Payer: Self-pay | Admitting: *Deleted

## 2021-08-01 ENCOUNTER — Telehealth (INDEPENDENT_AMBULATORY_CARE_PROVIDER_SITE_OTHER): Payer: Medicaid Other | Admitting: Physician Assistant

## 2021-08-01 ENCOUNTER — Encounter: Payer: Self-pay | Admitting: Physician Assistant

## 2021-08-01 DIAGNOSIS — R252 Cramp and spasm: Secondary | ICD-10-CM | POA: Diagnosis not present

## 2021-08-01 DIAGNOSIS — F32A Depression, unspecified: Secondary | ICD-10-CM

## 2021-08-01 DIAGNOSIS — F419 Anxiety disorder, unspecified: Secondary | ICD-10-CM | POA: Diagnosis not present

## 2021-08-01 MED ORDER — DULOXETINE HCL 20 MG PO CPEP: 40.0000 mg | ORAL_CAPSULE | Freq: Every day | ORAL | 0 refills | Status: DC

## 2021-08-01 MED ORDER — METHOCARBAMOL 500 MG PO TABS: 500.0000 mg | ORAL_TABLET | Freq: Three times a day (TID) | ORAL | 0 refills | Status: DC | PRN

## 2021-08-01 NOTE — Telephone Encounter (Signed)
I'm having random cramping all over my body for the past year.  I mentioned it to the dr and it's possibly the antidepressant I'm own.    ?I've not slept for several days.   When standing and torso straight is when most comfortable and not moving my arms.     I'm cramping all over my body.  I can't sleep.  This is the worst I've had this pain.    I have fibromyalgia.   This pain is different.   Because of the fibromyalgia I can't sleep when I'm hurting but this muscle cramping is different and not this bad. ? ? ? ?Reason for Disposition ? [1] SEVERE pain (e.g., excruciating, unable to do any normal activities) AND [2] not improved 2 hours after pain medicine ? ?Answer Assessment - Initial Assessment Questions ?1. ONSET: "When did the muscle aches or body pains start?"  ?   I'm cramping all over my body.   Started night before last. ?2. LOCATION: "What part of your body is hurting?" (e.g., entire body, arms, legs)  ?    All over.   I'm having terrible muscle cramps all over.   I can't sleep.   My muscles are sore. ?3. SEVERITY: "How bad is the pain?" (Scale 1-10; or mild, moderate, severe) ?  - MILD (1-3): doesn't interfere with normal activities  ?  - MODERATE (4-7): interferes with normal activities or awakens from sleep  ?  - SEVERE (8-10):  excruciating pain, unable to do any normal activities  ?    Severe ?4. CAUSE: "What do you think is causing the pains?" ?    I don't know ?5. FEVER: "Have you been having fever?" ?    No ?I felt nauseas and dizzy the next day after the cramping like this started ?6. OTHER SYMPTOMS: "Do you have any other symptoms?" (e.g., chest pain, weakness, rash, cold or flu symptoms, weight loss) ?    *No Answer* ?7. PREGNANCY: "Is there any chance you are pregnant?" "When was your last menstrual period?" ?    *No Answer* ?8. TRAVEL: "Have you traveled out of the country in the last month?" (e.g., travel history, exposures) ?    *No Answer* ? ?Protocols used: Muscle Aches and Body  Pain-A-AH ? ?

## 2021-08-01 NOTE — Telephone Encounter (Signed)
?  Chief Complaint: Cramping all over body and can't sleep either.  Have fibromyalgia but this is different ?Symptoms: muscle cramping all over body ?Frequency: Since night before last ?Pertinent Negatives: Patient denies it being like her fibromyalgia.    ?Disposition: '[]'$ ED /'[]'$ Urgent Care (no appt availability in office) / '[x]'$ Appointment(In office/virtual)/ '[]'$  [place] [facility]/ '[]'$ Home Care/ '[]'$ Refused Recommended Disposition /'[]'$ [facility]/ '[]'$  Follow-up with PCP ?Additional Notes: MyChart Virtual visit scheduled for today  ?

## 2021-08-01 NOTE — Assessment & Plan Note (Signed)
Chronic, recurrent, progressing ?States she has had episodic muscle cramping lasting less than 5 minutes for the past year but over the past 2 days she has developed almost intractable muscle cramping all over her body ?Will draw labs to check CMP, CBC, CK ?Recommend dc Flexeril as she reports it is not helping over the past two days ?Will add Robaxin 500 mg PO TID PRN  ?We discussed potential etiology of cramps as use of Cymbalta and Strattera- discussed taper of these to see if that assists with resolution  ?Lab results to further dictate management ?Follow up as needed  ? ? ?

## 2021-08-01 NOTE — Assessment & Plan Note (Addendum)
Chronic, appears to be well controlled on Cymbalta 30 mg 2 capsules PO QD and strattera 10 mg PO QD ?She is concerned for prolonged muscle cramping as a side effect of her current regimen ?Recommend taper of Cymbalta and Strattera to see if symptoms improve according to the following schedule: ?Stop taking the Cymbalta 30 mg by mouth two capsules per day ?Take the Cymbalta 20 mg 2 capsules to equal 40 mg per day for two weeks  ?Then decrease to Cymbalta 30 mg per day (one 30 mg capsule per day) for two weeks ?Follow up with PCP in 3-4 weeks to discuss effects on cramping and plan to manage depression/ anxiety.  ?

## 2021-08-01 NOTE — Patient Instructions (Addendum)
I recommend that we get some lab work to make sure there isn't an electrolyte issue  ?Please come to the office to have these drawn ?We will keep you updated with these results as they become available and provide instructions as needed. ?I recommend drinking plenty of water and using Pedialyte/ Gatorade to help with potential dehydration  ? ?Stop taking the Flexeril and take Robaxin 500 mg by mouth every 8 hours as needed for muscle spasms and cramping.  ? ?I recommend that we try to taper your Cymbalta to see if this is a side effect of that medication ?I recommend the following:  ?Stop taking the Cymbalta 30 mg by mouth two capsules per day ?Take the Cymbalta 20 mg 2 capsules to equal 40 mg per day for two weeks  ?Then decrease to Cymbalta 30 mg per day (one 30 mg capsule per day) for two weeks ? ?Try taking the Strattera every other day as well.  ? ?Follow up with your PCP after 3-4 weeks to see if your cramping is improving and to discuss changing to another antidepressant  ?Do not use Nicotine replacement products while you are actively still smoking as this could cause an potentially harmful accumulation of nicotine in your body.  ? ?It was nice to meet you and I appreciate the opportunity to be involved in your care ? ?

## 2021-08-01 NOTE — Progress Notes (Signed)
? ? ?Virtual Visit via Video Note ? ?I connected with Melissa Mendez on 08/01/21 at 11:20 AM EDT by a video enabled telemedicine application and verified that I am speaking with the correct person using two identifiers. ? ?Location: ?Patient: at home in [place]  ?Provider: [facility], [place], [place]  ?  ?I discussed the limitations of evaluation and management by telemedicine and the availability of in person appointments. The patient expressed understanding and agreed to proceed. ? ? ?CC: cramping all over body ? ?History of Present Illness: ? ?States for the past year to year and half she has noticed episodes of cramping usually resolves in less than 5 minutes ?States that over the past 2 days she has had intractable, full body cramping ?States her only relief is if she is standing upright with shoulder squared.  ?  ?Reports she is unable to sleep  ?Reports she suspects this could be a side effect of her antidepressant ? ?She is taking 60 mg of Cymbalta per day  ?Denies use of recreational drugs and consistent alcohol use.  ?States she is trying to quit smoking and is using nicotine patches but stopped using them last night  ?Denies SOB, nausea, vomiting, fevers, urine changes, dysuria,  ? ?Observations/Objective: ? ?Alert, oriented female ?Occasional wincing ?Appears to be moving around home  ?Able to engage in conversation with out signs of acute respiratory distress ? ? ?Assessment and Plan: ? ?Problem List Items Addressed This Visit   ? ?  ? Other  ? Anxiety and depression  ?  Chronic, appears to be well controlled on Cymbalta 30 mg 2 capsules PO QD and strattera 10 mg PO QD ?She is concerned for prolonged muscle cramping as a side effect of her current regimen ?Recommend taper of Cymbalta and Strattera to see if symptoms improve according to the following schedule: ?Stop taking the Cymbalta 30 mg by mouth two capsules per day ?Take the Cymbalta 20 mg 2 capsules to equal 40 mg per day for two  weeks  ?Then decrease to Cymbalta 30 mg per day (one 30 mg capsule per day) for two weeks ?Follow up with PCP in 3-4 weeks to discuss effects on cramping and plan to manage depression/ anxiety.  ?  ?  ? Relevant Medications  ? DULoxetine (CYMBALTA) 20 MG capsule  ? Muscle cramping - Primary  ?  Chronic, recurrent, progressing ?States she has had episodic muscle cramping lasting less than 5 minutes for the past year but over the past 2 days she has developed almost intractable muscle cramping all over her body ?Will draw labs to check CMP, CBC, CK ?Recommend dc Flexeril as she reports it is not helping over the past two days ?Will add Robaxin 500 mg PO TID PRN  ?We discussed potential etiology of cramps as use of Cymbalta and Strattera- discussed taper of these to see if that assists with resolution  ?Lab results to further dictate management ?Follow up as needed  ? ? ?  ?  ? Relevant Medications  ? methocarbamol (ROBAXIN) 500 MG tablet  ? Other Relevant Orders  ? Comp Met (CMET)  ? CBC w/Diff  ? CK (Creatine Kinase)  ? ?Follow Up Instructions: ? ?  ?I discussed the assessment and treatment plan with the patient. The patient was provided an opportunity to ask questions and all were answered. The patient agreed with the plan and demonstrated an understanding of the instructions. ?  ?The patient was advised to call back  or seek an in-person evaluation if the symptoms worsen or if the condition fails to improve as anticipated. ? ?I provided 27 minutes of non-face-to-face time during this encounter. ? ?Return in about 4 weeks (around 08/29/2021). ? ? ?I, Markesha Hannig E Venetta Knee, PA-C, have reviewed all documentation for this visit. The documentation on 08/01/21 for the exam, diagnosis, procedures, and orders are all accurate and complete. ? ? ?Jayro Mcmath, MHS, PA-C ?[facility] ?[facility]  ? ? ? ? ? ? ?

## 2021-08-09 ENCOUNTER — Other Ambulatory Visit: Payer: Self-pay | Admitting: Nurse Practitioner

## 2021-08-09 DIAGNOSIS — F32A Depression, unspecified: Secondary | ICD-10-CM

## 2021-08-09 DIAGNOSIS — M797 Fibromyalgia: Secondary | ICD-10-CM

## 2021-08-11 NOTE — Telephone Encounter (Signed)
Requested Prescriptions  ?Pending Prescriptions Disp Refills  ?? gabapentin (NEURONTIN) 800 MG tablet [Pharmacy Med Name: GABAPENTIN '800MG'$  TABLETS] 90 tablet 2  ?  Sig: TAKE 1 TABLET(800 MG) BY MOUTH THREE TIMES DAILY  ?  ? Neurology: Anticonvulsants - gabapentin Passed - 08/09/2021 10:43 AM  ?  ?  Passed - Cr in normal range and within 360 days  ?  Creatinine, Ser  ?Date Value Ref Range Status  ?10/03/2020 0.90 0.57 - 1.00 mg/dL Final  ?   ?  ?  Passed - Completed PHQ-2 or PHQ-9 in the last 360 days  ?  ?  Passed - Valid encounter within last 12 months  ?  Recent Outpatient Visits   ?      ? 1 week ago Muscle cramping  ? [place], PA-C  ? 3 months ago Encounter for weight management  ? [place], NP  ? 7 months ago Gastroesophageal reflux disease, unspecified whether esophagitis present  ? [place], Lauren A, NP  ? 10 months ago Cellulitis of right lower extremity  ? [place], Lauren A, NP  ? 10 months ago Pain and swelling of lower extremity, unspecified laterality  ? [facility] Jon Billings, NP  ?  ?  ? ?  ?  ?  ? ? ?

## 2021-08-21 ENCOUNTER — Ambulatory Visit: Payer: Medicaid Other | Admitting: Nurse Practitioner

## 2021-08-21 NOTE — Progress Notes (Deleted)
LMP 07/28/2021 (Approximate)    Subjective:    Patient ID: Melissa Mendez, female    DOB: [birth year], 31 y.o.   MRN: [id number]  HPI: Melissa Mendez is a 31 y.o. female  No chief complaint on file.  KNEE PAIN Duration: {Blank single:19197::"chronic","days","weeks","months"} Involved knee: {Blank single:19197::"left","right","bilateral"} Mechanism of injury: {Blank single:19197::"trauma","unknown"} Location:{Blank single:19197::"anterior","posterior","lateral","medial","diffuse"} Onset: {Blank single:19197::"sudden","gradual"} Severity: {Blank single:19197::"mild","moderate","severe","1/10","2/10","3/10","4/10","5/10","6/10","7/10","8/10","9/10","10/10"}  Quality:  {Blank multiple:19196::"sharp","dull","aching","burning","cramping","ill-defined","itchy","pressure-like","pulling","shooting","sore","stabbing","tender","tearing","throbbing"} Frequency: {Blank single:19197::"constant","intermittent","occasional","rare","every few minutes","a few times a hour","a few times a day","a few times a week","a few times a month","a few times a year"} Radiation: {Blank single:19197::"yes","no"} Aggravating factors: {Blank multiple:19196::"weight bearing","walking","running","stairs","bending","movement","prolonged sitting"}  Alleviating factors: {Blank multiple:19196::"nothing","ice","physical therapy","HEP","APAP","NSAIDs","brace","crutches","rest"}  Status: {Blank multiple:19196::"better","worse","stable","fluctuating"} Treatments attempted: {Blank multiple:19196::"none","rest","ice","heat","APAP","ibuprofen","aleve","physical therapy","HEP"}  Relief with NSAIDs?:  {Blank single:19197::"No NSAIDs Taken","no","mild","moderate","significant"} Weakness with weight bearing or walking: {Blank single:19197::"yes","no"} Sensation of giving way: {Blank single:19197::"yes","no"} Locking: {Blank single:19197::"yes","no"} Popping: {Blank single:19197::"yes","no"} Bruising: {Blank  single:19197::"yes","no"} Swelling: {Blank single:19197::"yes","no"} Redness: {Blank single:19197::"yes","no"} Paresthesias/decreased sensation: {Blank single:19197::"yes","no"} Fevers: {Blank single:19197::"yes","no"}  Relevant past medical, surgical, family and social history reviewed and updated as indicated. Interim medical history since our last visit reviewed. Allergies and medications reviewed and updated.  Review of Systems  Per HPI unless specifically indicated above     Objective:    LMP 07/28/2021 (Approximate)   Wt Readings from Last 3 Encounters:  10/03/20 (!) 303 lb 4 oz (137.6 kg)  09/02/20 295 lb 6.4 oz (134 kg)  08/27/20 299 lb (135.6 kg)    Physical Exam  Results for orders placed or performed in visit on 10/03/20  Comp Met (CMET)  Result Value Ref Range   Glucose 104 (H) 65 - 99 mg/dL   BUN 10 6 - 20 mg/dL   Creatinine, Ser 0.90 0.57 - 1.00 mg/dL   eGFR 88 >59 mL/min/1.73   BUN/Creatinine Ratio 11 9 - 23   Sodium 138 134 - 144 mmol/L   Potassium 4.8 3.5 - 5.2 mmol/L   Chloride 102 96 - 106 mmol/L   CO2 21 20 - 29 mmol/L   Calcium 9.6 8.7 - 10.2 mg/dL   Total Protein 6.9 6.0 - 8.5 g/dL   Albumin 4.4 3.8 - 4.8 g/dL   Globulin, Total 2.5 1.5 - 4.5 g/dL   Albumin/Globulin Ratio 1.8 1.2 - 2.2   Bilirubin Total 0.3 0.0 - 1.2 mg/dL   Alkaline Phosphatase 66 44 - 121 IU/L   AST 14 0 - 40 IU/L   ALT 13 0 - 32 IU/L  CBC w/Diff  Result Value Ref Range   WBC 7.0 3.4 - 10.8 x10E3/uL   RBC 4.58 3.77 - 5.28 x10E6/uL   Hemoglobin 12.6 11.1 - 15.9 g/dL   Hematocrit 38.4 34.0 - 46.6 %   MCV 84 79 - 97 fL   MCH 27.5 26.6 - 33.0 pg   MCHC 32.8 31.5 - 35.7 g/dL   RDW 12.9 11.7 - 15.4 %   Platelets 275 150 - 450 x10E3/uL   Neutrophils 48 Not Estab. %   Lymphs 37 Not Estab. %   Monocytes 7 Not Estab. %   Eos 7 Not Estab. %   Basos 1 Not Estab. %   Neutrophils Absolute 3.4 1.4 - 7.0 x10E3/uL   Lymphocytes Absolute 2.6 0.7 - 3.1 x10E3/uL   Monocytes Absolute  0.5 0.1 - 0.9 x10E3/uL   EOS (ABSOLUTE) 0.5 (H) 0.0 - 0.4 x10E3/uL   Basophils Absolute 0.1 0.0 - 0.2 x10E3/uL   Immature Granulocytes 0 Not Estab. %   Immature Grans (Abs) 0.0 0.0 - 0.1  x10E3/uL      Assessment & Plan:   Problem List Items Addressed This Visit   None    Follow up plan: No follow-ups on file.

## 2021-09-01 ENCOUNTER — Encounter: Payer: Self-pay | Admitting: Nurse Practitioner

## 2021-09-01 ENCOUNTER — Ambulatory Visit (INDEPENDENT_AMBULATORY_CARE_PROVIDER_SITE_OTHER): Payer: Medicaid Other | Admitting: Nurse Practitioner

## 2021-09-01 VITALS — BP 133/90 | HR 75 | Temp 98.4°F | Wt 323.8 lb

## 2021-09-01 DIAGNOSIS — M25561 Pain in right knee: Secondary | ICD-10-CM | POA: Diagnosis not present

## 2021-09-01 DIAGNOSIS — G8929 Other chronic pain: Secondary | ICD-10-CM

## 2021-09-01 DIAGNOSIS — F419 Anxiety disorder, unspecified: Secondary | ICD-10-CM | POA: Diagnosis not present

## 2021-09-01 DIAGNOSIS — F32A Depression, unspecified: Secondary | ICD-10-CM

## 2021-09-01 DIAGNOSIS — K219 Gastro-esophageal reflux disease without esophagitis: Secondary | ICD-10-CM | POA: Diagnosis not present

## 2021-09-01 MED ORDER — ESCITALOPRAM OXALATE 5 MG PO TABS: 5.0000 mg | ORAL_TABLET | Freq: Every day | ORAL | 0 refills | Status: DC

## 2021-09-01 MED ORDER — METHYLPREDNISOLONE 4 MG PO TBPK: ORAL_TABLET | ORAL | 0 refills | Status: DC

## 2021-09-01 NOTE — Assessment & Plan Note (Signed)
Chronic. Will continue to decrease Duloxetine.  Can take '20mg'$  every other day for 1 week then stop.  Start Lexapro '5mg'$  daily.  Side effects and benefits discussed during visit.  Follow up in 1 month for reevaluation. ?

## 2021-09-01 NOTE — Progress Notes (Signed)
? ?BP 133/90   Pulse 75   Temp 98.4 ?F (36.9 ?C) (Oral)   Wt (!) 323 lb 12.8 oz (146.9 kg)   LMP 08/31/2021   SpO2 97%   Breastfeeding No   BMI 48.79 kg/m?   ? ?Subjective:  ? ? Patient ID: Melissa Mendez, female    DOB: August 13, 1990, 31 y.o.   MRN: [id number] ? ?HPI: ?Melissa Mendez is a 31 y.o. female ? ?Chief Complaint  ?Patient presents with  ? Knee Pain  ?  R knee pain x a few months. No recent fall/injury.   ? Medication Management  ?  States antidepressant was lowered last video visit, would like to find a different antidepressant, she feels like the muscle cramps are still present.    ? Weight Gain  ?  Has been at a steady weight even though she has been vomiting x years per patient.   ? ?KNEE PAIN ?Duration: months ?Involved knee: right ?Mechanism of injury: unknown ?Location:anterior ?Onset: gradual ?Severity: 6/10  ?Quality:  shooting ?Frequency: intermittent ?Radiation: no ?Aggravating factors: weight bearing, walking, and bending  ?Alleviating factors:  tylenol and gabapentin   ?Status: stable ?Treatments attempted:  tylenol and gabapentin   ?Relief with NSAIDs?:  No NSAIDs Taken ?Weakness with weight bearing or walking: yes ?Sensation of giving way: no ?Locking: yes ?Popping: yes ?Bruising: no ?Swelling: no ?Redness: no ?Paresthesias/decreased sensation: no ?Fevers: no ? ?Patient states she is still vomiting everyday and she is continuing to gain weight.  Patient states she had a break for awhile but then it started again.  If she doesn't take the pantoprazole she is very sick.  When she misses a dose it is very bad.   ? ?DEPRESSION ?Patient states she feels like the duloxetine is making her muscle cramps. Her dose was decreased at last visit and the muscle cramps have improved.   She does not want to go off antidepressants.  She has tried lexapro when she was 61.  Her current dose of duloxetine is 50m. ? ?Relevant past medical, surgical, family and social history reviewed and updated as indicated.  Interim medical history since our last visit reviewed. ?Allergies and medications reviewed and updated. ? ?Review of Systems  ?Gastrointestinal:  Positive for vomiting.  ?Musculoskeletal:   ?     Knee pain  ?Psychiatric/Behavioral:  Positive for dysphoric mood. The patient is nervous/anxious.   ? ?Per HPI unless specifically indicated above ? ?   ?Objective:  ?  ?BP 133/90   Pulse 75   Temp 98.4 ?F (36.9 ?C) (Oral)   Wt (!) 323 lb 12.8 oz (146.9 kg)   LMP 08/31/2021   SpO2 97%   Breastfeeding No   BMI 48.79 kg/m?   ?Wt Readings from Last 3 Encounters:  ?09/01/21 (!) 323 lb 12.8 oz (146.9 kg)  ?10/03/20 (!) 303 lb 4 oz (137.6 kg)  ?09/02/20 295 lb 6.4 oz (134 kg)  ?  ?Physical Exam ?Vitals and nursing note reviewed.  ?Constitutional:   ?   General: She is not in acute distress. ?   Appearance: Normal appearance. She is obese. She is not ill-appearing, toxic-appearing or diaphoretic.  ?HENT:  ?   Head: Normocephalic.  ?   Right Ear: External ear normal.  ?   Left Ear: External ear normal.  ?   Nose: Nose normal.  ?   Mouth/Throat:  ?   Mouth: Mucous membranes are moist.  ?   Pharynx: Oropharynx is clear.  ?Eyes:  ?  General:     ?   Right eye: No discharge.     ?   Left eye: No discharge.  ?   Extraocular Movements: Extraocular movements intact.  ?   Conjunctiva/sclera: Conjunctivae normal.  ?   Pupils: Pupils are equal, round, and reactive to light.  ?Cardiovascular:  ?   Rate and Rhythm: Normal rate and regular rhythm.  ?   Heart sounds: No murmur heard. ?Pulmonary:  ?   Effort: Pulmonary effort is normal. No respiratory distress.  ?   Breath sounds: Normal breath sounds. No wheezing or rales.  ?Musculoskeletal:  ?   Cervical back: Normal range of motion and neck supple.  ?Skin: ?   General: Skin is warm and dry.  ?   Capillary Refill: Capillary refill takes less than 2 seconds.  ?Neurological:  ?   General: No focal deficit present.  ?   Mental Status: She is alert and oriented to person, place, and time.  Mental status is at baseline.  ?Psychiatric:     ?   Mood and Affect: Mood normal.     ?   Behavior: Behavior normal.     ?   Thought Content: Thought content normal.     ?   Judgment: Judgment normal.  ? ? ?Results for orders placed or performed in visit on 10/03/20  ?Comp Met (CMET)  ?Result Value Ref Range  ? Glucose 104 (H) 65 - 99 mg/dL  ? BUN 10 6 - 20 mg/dL  ? Creatinine, Ser 0.90 0.57 - 1.00 mg/dL  ? eGFR 88 >59 mL/min/1.73  ? BUN/Creatinine Ratio 11 9 - 23  ? Sodium 138 134 - 144 mmol/L  ? Potassium 4.8 3.5 - 5.2 mmol/L  ? Chloride 102 96 - 106 mmol/L  ? CO2 21 20 - 29 mmol/L  ? Calcium 9.6 8.7 - 10.2 mg/dL  ? Total Protein 6.9 6.0 - 8.5 g/dL  ? Albumin 4.4 3.8 - 4.8 g/dL  ? Globulin, Total 2.5 1.5 - 4.5 g/dL  ? Albumin/Globulin Ratio 1.8 1.2 - 2.2  ? Bilirubin Total 0.3 0.0 - 1.2 mg/dL  ? Alkaline Phosphatase 66 44 - 121 IU/L  ? AST 14 0 - 40 IU/L  ? ALT 13 0 - 32 IU/L  ?CBC w/Diff  ?Result Value Ref Range  ? WBC 7.0 3.4 - 10.8 x10E3/uL  ? RBC 4.58 3.77 - 5.28 x10E6/uL  ? Hemoglobin 12.6 11.1 - 15.9 g/dL  ? Hematocrit 38.4 34.0 - 46.6 %  ? MCV 84 79 - 97 fL  ? MCH 27.5 26.6 - 33.0 pg  ? MCHC 32.8 31.5 - 35.7 g/dL  ? RDW 12.9 11.7 - 15.4 %  ? Platelets 275 150 - 450 x10E3/uL  ? Neutrophils 48 Not Estab. %  ? Lymphs 37 Not Estab. %  ? Monocytes 7 Not Estab. %  ? Eos 7 Not Estab. %  ? Basos 1 Not Estab. %  ? Neutrophils Absolute 3.4 1.4 - 7.0 x10E3/uL  ? Lymphocytes Absolute 2.6 0.7 - 3.1 x10E3/uL  ? Monocytes Absolute 0.5 0.1 - 0.9 x10E3/uL  ? EOS (ABSOLUTE) 0.5 (H) 0.0 - 0.4 x10E3/uL  ? Basophils Absolute 0.1 0.0 - 0.2 x10E3/uL  ? Immature Granulocytes 0 Not Estab. %  ? Immature Grans (Abs) 0.0 0.0 - 0.1 x10E3/uL  ? ?   ?Assessment & Plan:  ? ?Problem List Items Addressed This Visit   ? ?  ? Digestive  ? Gastroesophageal reflux disease  ?  Chronic.  Ongoing. Still having vomiting.  Has not followed up with GI.  Recommend she follow up with them for further evaluation and management. ? ?  ?  ?  ? Other  ?  Anxiety and depression  ?  Chronic. Will continue to decrease Duloxetine.  Can take 12m every other day for 1 week then stop.  Start Lexapro 588mdaily.  Side effects and benefits discussed during visit.  Follow up in 1 month for reevaluation. ? ?  ?  ? Relevant Medications  ? escitalopram (LEXAPRO) 5 MG tablet  ? ?Other Visit Diagnoses   ? ? Chronic pain of right knee    -  Primary  ? Will obtian knee xray.  Will start Medrol dose pack to see if symptoms improve. If not improved at 1 month follow up will send to ortho.  ? Relevant Orders  ? DG Knee Complete 4 Views Right  ? ?  ?  ? ?Follow up plan: ?Return in about 1 month (around 10/02/2021) for Depression/Anxiety FU. ? ? ? ? ? ?

## 2021-09-01 NOTE — Assessment & Plan Note (Signed)
Chronic. Ongoing. Still having vomiting.  Has not followed up with GI.  Recommend she follow up with them for further evaluation and management. ?

## 2021-09-08 ENCOUNTER — Other Ambulatory Visit: Payer: Self-pay | Admitting: Nurse Practitioner

## 2021-09-09 NOTE — Telephone Encounter (Signed)
Requested by interface surescripts. Medication discontinued 08/01/21. Requested Prescriptions  Refused Prescriptions Disp Refills  . DULoxetine (CYMBALTA) 30 MG capsule [Pharmacy Med Name: DULOXETINE DR 30MG CAPSULES] 120 capsule 0    Sig: TAKE 2 CAPSULES BY MOUTH EVERY DAY     Psychiatry: Antidepressants - SNRI - duloxetine Failed - 09/08/2021  7:30 AM      Failed - Last BP in normal range    BP Readings from Last 1 Encounters:  09/01/21 133/90         Passed - Cr in normal range and within 360 days    Creatinine, Ser  Date Value Ref Range Status  10/03/2020 0.90 0.57 - 1.00 mg/dL Final         Passed - eGFR is 30 or above and within 360 days    GFR calc Af Amer  Date Value Ref Range Status  12/01/2019 90 >59 mL/min/1.73 Final    Comment:    **[facility] currently reports eGFR in compliance with the current**   recommendations of the [employer]. [facility] will   update reporting as new guidelines are published from the NKF-ASN   Task force.    GFR calc non Af Amer  Date Value Ref Range Status  12/01/2019 78 >59 mL/min/1.73 Final   eGFR  Date Value Ref Range Status  10/03/2020 88 >59 mL/min/1.73 Final         Passed - Completed PHQ-2 or PHQ-9 in the last 360 days      Passed - Valid encounter within last 6 months    Recent Outpatient Visits          1 week ago Chronic pain of right knee   [place], NP   1 month ago Muscle cramping   [facility], PA-C   4 months ago Encounter for weight management   [facility] Jon Billings, NP   8 months ago Gastroesophageal reflux disease, unspecified whether esophagitis present   [facility], Lauren A, NP   11 months ago Cellulitis of right lower extremity   [place], Scheryl Darter, NP      Future Appointments            In 3 weeks Jon Billings, [place], [place]   In 3 months  [name], [place]

## 2021-09-28 ENCOUNTER — Other Ambulatory Visit: Payer: Self-pay | Admitting: Nurse Practitioner

## 2021-09-29 NOTE — Telephone Encounter (Signed)
Requested Prescriptions  Pending Prescriptions Disp Refills  . escitalopram (LEXAPRO) 5 MG tablet [Pharmacy Med Name: ESCITALOPRAM '5MG'$  TABLETS] 90 tablet 1    Sig: TAKE 1 TABLET(5 MG) BY MOUTH DAILY     Psychiatry:  Antidepressants - SSRI Passed - 09/28/2021 10:14 AM      Passed - Completed PHQ-2 or PHQ-9 in the last 360 days      Passed - Valid encounter within last 6 months    Recent Outpatient Visits          4 weeks ago Chronic pain of right knee   [facility] Jon Billings, NP   1 month ago Muscle cramping   [place], Dani Gobble, PA-C   4 months ago Encounter for weight management   [facility] Jon Billings, NP   9 months ago Gastroesophageal reflux disease, unspecified whether esophagitis present   [facility], Lauren A, NP   12 months ago Cellulitis of right lower extremity   [place], Scheryl Darter, NP      Future Appointments            In 3 days Jon Billings, [place], [place]   In 2 months [name], [place]

## 2021-10-02 ENCOUNTER — Ambulatory Visit: Payer: Medicaid Other | Admitting: Physician Assistant
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
## 2021-11-10 ENCOUNTER — Other Ambulatory Visit: Payer: Self-pay | Admitting: Nurse Practitioner

## 2021-11-10 DIAGNOSIS — F32A Depression, unspecified: Secondary | ICD-10-CM

## 2021-11-10 DIAGNOSIS — M797 Fibromyalgia: Secondary | ICD-10-CM

## 2021-11-12 NOTE — Telephone Encounter (Signed)
Requested Prescriptions  Pending Prescriptions Disp Refills  . gabapentin (NEURONTIN) 800 MG tablet [Pharmacy Med Name: GABAPENTIN '800MG'$  TABLETS] 270 tablet 0    Sig: TAKE 1 TABLET(800 MG) BY MOUTH THREE TIMES DAILY     Neurology: Anticonvulsants - gabapentin Failed - 11/10/2021  6:32 PM      Failed - Cr in normal range and within 360 days    Creatinine, Ser  Date Value Ref Range Status  10/03/2020 0.90 0.57 - 1.00 mg/dL Final         Passed - Completed PHQ-2 or PHQ-9 in the last 360 days      Passed - Valid encounter within last 12 months    Recent Outpatient Visits          2 months ago Chronic pain of right knee   [facility] Jon Billings, NP   3 months ago Muscle cramping   [place], Dani Gobble, PA-C   6 months ago Encounter for weight management   [facility] Jon Billings, NP   10 months ago Gastroesophageal reflux disease, unspecified whether esophagitis present   [place] [name], Lauren A, NP   1 year ago Cellulitis of right lower extremity   [place], Scheryl Darter, NP      Future Appointments            In 1 month [name], MD [place]

## 2021-11-19 NOTE — Progress Notes (Deleted)
There were no vitals taken for this visit.   Subjective:    Patient ID: Melissa Mendez, female    DOB: [birth year], 31 y.o.   MRN: [id number]  HPI: Melissa Mendez is a 31 y.o. female  No chief complaint on file.  RASH Duration:  {Blank single:19197::"chronic","days","weeks","months"}  Location: {Blank multiple:19196::"generalized","trunk","face","hands","arms","legs","groin"}  Itching: {Blank single:19197::"yes","no"} Burning: {Blank single:19197::"yes","no"} Redness: {Blank single:19197::"yes","no"} Oozing: {Blank single:19197::"yes","no"} Scaling: {Blank single:19197::"yes","no"} Blisters: {Blank single:19197::"yes","no"} Painful: {Blank single:19197::"yes","no"} Fevers: {Blank single:19197::"yes","no"} Change in detergents/soaps/personal care products: {Blank single:19197::"yes","no"} Recent illness: {Blank single:19197::"yes","no"} Recent travel:{Blank single:19197::"yes","no"} History of same: {Blank single:19197::"yes","no"} Context: {Blank multiple:19196::"better","worse","stable","fluctuating","contacts with the same"} Alleviating factors: {Blank multiple:19196::"hydrocortisone cream","benadryl","lotion/moisturizer","nothing"} Treatments attempted:{Blank multiple:19196::"hydrocortisone cream","benadryl","OTC anit-fungal","lotion/moisturizer","nothing"} Shortness of breath: {Blank single:19197::"yes","no"}  Throat/tongue swelling: {Blank single:19197::"yes","no"} Myalgias/arthralgias: {Blank single:19197::"yes","no"}  Relevant past medical, surgical, family and social history reviewed and updated as indicated. Interim medical history since our last visit reviewed. Allergies and medications reviewed and updated.  Review of Systems  Per HPI unless specifically indicated above     Objective:    There were no vitals taken for this visit.  Wt Readings from Last 3 Encounters:  09/01/21 (!) 323 lb 12.8 oz (146.9 kg)  10/03/20 (!) 303 lb 4 oz (137.6 kg)  09/02/20 295 lb  6.4 oz (134 kg)    Physical Exam  Results for orders placed or performed in visit on 10/03/20  Comp Met (CMET)  Result Value Ref Range   Glucose 104 (H) 65 - 99 mg/dL   BUN 10 6 - 20 mg/dL   Creatinine, Ser 2.51 0.57 - 1.00 mg/dL   eGFR 88 >49 JG/BQH/5.58   BUN/Creatinine Ratio 11 9 - 23   Sodium 138 134 - 144 mmol/L   Potassium 4.8 3.5 - 5.2 mmol/L   Chloride 102 96 - 106 mmol/L   CO2 21 20 - 29 mmol/L   Calcium 9.6 8.7 - 10.2 mg/dL   Total Protein 6.9 6.0 - 8.5 g/dL   Albumin 4.4 3.8 - 4.8 g/dL   Globulin, Total 2.5 1.5 - 4.5 g/dL   Albumin/Globulin Ratio 1.8 1.2 - 2.2   Bilirubin Total 0.3 0.0 - 1.2 mg/dL   Alkaline Phosphatase 66 44 - 121 IU/L   AST 14 0 - 40 IU/L   ALT 13 0 - 32 IU/L  CBC w/Diff  Result Value Ref Range   WBC 7.0 3.4 - 10.8 x10E3/uL   RBC 4.58 3.77 - 5.28 x10E6/uL   Hemoglobin 12.6 11.1 - 15.9 g/dL   Hematocrit 44.1 68.3 - 46.6 %   MCV 84 79 - 97 fL   MCH 27.5 26.6 - 33.0 pg   MCHC 32.8 31.5 - 35.7 g/dL   RDW 58.7 78.7 - 60.8 %   Platelets 275 150 - 450 x10E3/uL   Neutrophils 48 Not Estab. %   Lymphs 37 Not Estab. %   Monocytes 7 Not Estab. %   Eos 7 Not Estab. %   Basos 1 Not Estab. %   Neutrophils Absolute 3.4 1.4 - 7.0 x10E3/uL   Lymphocytes Absolute 2.6 0.7 - 3.1 x10E3/uL   Monocytes Absolute 0.5 0.1 - 0.9 x10E3/uL   EOS (ABSOLUTE) 0.5 (H) 0.0 - 0.4 x10E3/uL   Basophils Absolute 0.1 0.0 - 0.2 x10E3/uL   Immature Granulocytes 0 Not Estab. %   Immature Grans (Abs) 0.0 0.0 - 0.1 x10E3/uL      Assessment & Plan:   Problem List Items Addressed This Visit   None    Follow up plan: No follow-ups on  file.

## 2021-11-20 ENCOUNTER — Ambulatory Visit: Payer: Medicaid Other | Admitting: Nurse Practitioner

## 2021-12-23 ENCOUNTER — Encounter: Payer: Self-pay | Admitting: Gastroenterology

## 2021-12-23 ENCOUNTER — Ambulatory Visit (INDEPENDENT_AMBULATORY_CARE_PROVIDER_SITE_OTHER): Payer: Medicaid Other | Admitting: Gastroenterology

## 2021-12-23 VITALS — BP 144/81 | HR 76 | Temp 99.0°F | Wt 323.8 lb

## 2021-12-23 DIAGNOSIS — R110 Nausea: Secondary | ICD-10-CM | POA: Diagnosis not present

## 2021-12-23 DIAGNOSIS — K219 Gastro-esophageal reflux disease without esophagitis: Secondary | ICD-10-CM | POA: Diagnosis not present

## 2021-12-23 MED ORDER — FAMOTIDINE 40 MG PO TABS: 40.0000 mg | ORAL_TABLET | Freq: Every day | ORAL | 3 refills | Status: DC

## 2021-12-23 NOTE — Progress Notes (Signed)
Melissa Bellows MD, MRCP(U.K) [address]  [place]  [place], [place] [address]  Main: [phone]  Fax: [phone]   Primary Care Physician: Melissa Billings, NP  Primary Gastroenterologist:  Dr. Jonathon Mendez   Chief Complaint  Patient presents with   Emesis    HPI: Melissa Mendez is a 31 y.o. female   Summary of history :  She is a patient who was previously seen by Dr. Bonna Gains last in May 2022.  Previously been seen for nausea and vomiting..  Prior endoscopy showed food in the stomach despite fasting and he has a history suggestive of gastroparesis.  Grade a esophagitis was noted.  Gastric emptying study did not show any delayed emptying.  Interval history May 2022-12/23/2021 Since her last visit she continues to have on and off vomiting usually first thing in the morning preceded with a sense of nausea.  Can bring her food that is over already when she burps has a foul smell.  She takes pantoprazole 20 mg twice a day.  With the stool she has a good improvement in the symptoms but not completely resolved.  She is using marijuana which she smokes Current Outpatient Medications  Medication Sig Dispense Refill   atomoxetine (STRATTERA) 10 MG capsule Take 1 capsule (10 mg total) by mouth daily. 30 capsule 0   escitalopram (LEXAPRO) 5 MG tablet TAKE 1 TABLET(5 MG) BY MOUTH DAILY 90 tablet 1   famotidine (PEPCID) 40 MG tablet Take 1 tablet (40 mg total) by mouth at bedtime. 90 tablet 3   gabapentin (NEURONTIN) 800 MG tablet TAKE 1 TABLET(800 MG) BY MOUTH THREE TIMES DAILY 270 tablet 0   methocarbamol (ROBAXIN) 500 MG tablet Take 1 tablet (500 mg total) by mouth every 8 (eight) hours as needed for muscle spasms. 15 tablet 0   methylPREDNISolone (MEDROL DOSEPAK) 4 MG TBPK tablet Take as directed 21 tablet 0   naloxone (NARCAN) nasal spray 4 mg/0.1 mL SMARTSIG:Both Nares     pantoprazole (PROTONIX) 40 MG tablet TAKE 1 TABLET(40 MG) BY MOUTH TWICE DAILY BEFORE A MEAL 180 tablet 1    Thiamine HCl (VITAMIN B-1) 250 MG tablet Take 250 mg by mouth daily.     No current facility-administered medications for this visit.    Allergies as of 12/23/2021 - Review Complete 12/23/2021  Allergen Reaction Noted   Sulfa antibiotics Other (See Comments) 03/27/2011    ROS:  General: Negative for anorexia, weight loss, fever, chills, fatigue, weakness. ENT: Negative for hoarseness, difficulty swallowing , nasal congestion. CV: Negative for chest pain, angina, palpitations, dyspnea on exertion, peripheral edema.  Respiratory: Negative for dyspnea at rest, dyspnea on exertion, cough, sputum, wheezing.  GI: See history of present illness. GU:  Negative for dysuria, hematuria, urinary incontinence, urinary frequency, nocturnal urination.  Endo: Negative for unusual weight change.    Physical Examination:   BP (!) 144/81   Pulse 76   Temp 99 F (37.2 C) (Oral)   Wt (!) 323 lb 12.8 oz (146.9 kg)   BMI 48.79 kg/m   General: Well-nourished, well-developed in no acute distress.  Eyes: No icterus. Conjunctivae pink. Mouth: Oropharyngeal mucosa moist and pink , no lesions erythema or exudate. Neuro: Alert and oriented x 3.  Grossly intact. Skin: Warm and dry, no jaundice.   Psych: Alert and cooperative, normal mood and affect.   Imaging Studies: No results found.  Assessment and Plan:   Melissa Mendez is a 31 y.o. y/o female here to  follow-up for nausea vomiting.  History of endoscopy by Dr. Bonna Gains showed food in the stomach.  At this the vomiting occurred on and off.  She had exposure to marijuana explained that she could have cannabinoid hyperemesis syndrome.  She has good relief of her reflux symptoms with 20 mg twice daily pantoprazole but would like any higher dose to get complete relief of her symptoms  Plan 1.  Reflux lifestyle changes increase dose of pantoprazole 40 mg twice daily.  New prescription will be given famotidine will be also added at night 2.  Advised  to stop exposure to marijuana which can help with the symptoms.  Provided information on cannabinoid hyperemesis syndrome 3.  If symptoms persist may benefit from occasional use of Reglan.    Dr Melissa Bellows  MD,MRCP [facility]) Follow up in 2 months

## 2022-01-01 ENCOUNTER — Other Ambulatory Visit: Payer: Self-pay | Admitting: Nurse Practitioner

## 2022-01-02 NOTE — Telephone Encounter (Signed)
Requested Prescriptions  Pending Prescriptions Disp Refills  . pantoprazole (PROTONIX) 40 MG tablet [Pharmacy Med Name: PANTOPRAZOLE '40MG'$  TABLETS] 180 tablet 1    Sig: TAKE 1 TABLET(40 MG) BY MOUTH TWICE DAILY BEFORE A MEAL     Gastroenterology: Proton Pump Inhibitors Passed - 01/01/2022  7:11 PM      Passed - Valid encounter within last 12 months    Recent Outpatient Visits          4 months ago Chronic pain of right knee   [facility] Jon Billings, NP   5 months ago Muscle cramping   [place], Dani Gobble, PA-C   8 months ago Encounter for weight management   [place], NP   1 year ago Gastroesophageal reflux disease, unspecified whether esophagitis present   [place] [name], Lauren A, NP   1 year ago Cellulitis of right lower extremity   [place], Scheryl Darter, NP      Future Appointments            In 1 month [name], MD [place]

## 2022-01-20 ENCOUNTER — Ambulatory Visit: Payer: Medicaid Other | Admitting: Physician Assistant

## 2022-01-20 NOTE — Progress Notes (Deleted)
          Acute Office Visit   Patient: Melissa Mendez   DOB: 1991/01/22   31 y.o. Female  MRN: [id number] Visit Date: 01/20/2022  Today's healthcare provider: Dani Gobble Orvan Papadakis, PA-C  Introduced myself to the patient as a Journalist, newspaper and provided education on APPs in clinical practice.    No chief complaint on file.  Subjective    HPI    Medications: Outpatient Medications Prior to Visit  Medication Sig   atomoxetine (STRATTERA) 10 MG capsule Take 1 capsule (10 mg total) by mouth daily.   escitalopram (LEXAPRO) 5 MG tablet TAKE 1 TABLET(5 MG) BY MOUTH DAILY   famotidine (PEPCID) 40 MG tablet Take 1 tablet (40 mg total) by mouth at bedtime.   gabapentin (NEURONTIN) 800 MG tablet TAKE 1 TABLET(800 MG) BY MOUTH THREE TIMES DAILY   methocarbamol (ROBAXIN) 500 MG tablet Take 1 tablet (500 mg total) by mouth every 8 (eight) hours as needed for muscle spasms.   methylPREDNISolone (MEDROL DOSEPAK) 4 MG TBPK tablet Take as directed   naloxone (NARCAN) nasal spray 4 mg/0.1 mL SMARTSIG:Both Nares   pantoprazole (PROTONIX) 40 MG tablet TAKE 1 TABLET(40 MG) BY MOUTH TWICE DAILY BEFORE A MEAL   Thiamine HCl (VITAMIN B-1) 250 MG tablet Take 250 mg by mouth daily.   No facility-administered medications prior to visit.    Review of Systems  {Labs  Heme  Chem  Endocrine  Serology  Results Review (optional):23779}   Objective    There were no vitals taken for this visit. {Show previous vital signs (optional):23777}  Physical Exam    No results found for any visits on 01/20/22.  Assessment & Plan      No follow-ups on file.

## 2022-01-27 ENCOUNTER — Telehealth (INDEPENDENT_AMBULATORY_CARE_PROVIDER_SITE_OTHER): Payer: Medicaid Other | Admitting: Physician Assistant

## 2022-01-27 ENCOUNTER — Encounter: Payer: Self-pay | Admitting: Physician Assistant

## 2022-01-27 DIAGNOSIS — J0100 Acute maxillary sinusitis, unspecified: Secondary | ICD-10-CM | POA: Diagnosis not present

## 2022-01-27 MED ORDER — DOXYCYCLINE HYCLATE 100 MG PO TABS: 100.0000 mg | ORAL_TABLET | Freq: Two times a day (BID) | ORAL | 0 refills | Status: AC

## 2022-01-27 NOTE — Progress Notes (Signed)
Virtual Visit via Telephone Note  I connected with Melissa Mendez on 01/27/22 at  1:00 PM EDT by telephone and verified that I am speaking with the correct person using two identifiers.  Today's Provider: Talitha Givens, MHS, PA-C Introduced myself to the patient as a PA-C and provided education on APPs in clinical practice.   Location: Patient: At work,  [place]  Provider: [place], [place]    I discussed the limitations, risks, security and privacy concerns of performing an evaluation and management service by telephone and the availability of in person appointments. I also discussed with the patient that there may be a patient responsible charge related to this service. The patient expressed understanding and agreed to proceed.  Chief Complaint  Patient presents with   Nasal Congestion    For past 2 weeks    History of Present Illness:   Reports congestion for the past few weeks and states over the past few days it has gotten worse Reports nasal congestion, ears feeling clogged and dizziness, fatigue  Onset: gradual Duration:2 weeks Alleviating:nothing  Aggravating:nothing Interventions: She has been taking Allegra and Sudafed but these are not providing relief  Sick contacts:no  COVID testing at home: She has taken an at-home test   Results: negative  She states in the past that Amoxicillin usually does not provide relief from illness like this and reports she has tolerated Doxycycline well in the past.   Review of Systems  Constitutional:  Positive for malaise/fatigue. Negative for chills and fever.  HENT:  Positive for congestion, ear pain, sinus pain and tinnitus. Negative for hearing loss and sore throat.   Eyes:  Negative for blurred vision, double vision, discharge and redness.  Respiratory:  Positive for cough. Negative for shortness of breath and wheezing.   Cardiovascular:  Negative for chest pain and palpitations.  Gastrointestinal:  Negative for  diarrhea, nausea and vomiting.  Musculoskeletal:  Negative for myalgias.  Neurological:  Positive for dizziness and headaches.      Observations/Objective:  Due to the nature of the virtual visit, physical exam and observations are limited. Able to obtain the following observations:  Alert, oriented, no appreciated hoarseness, tachypnea, wheeze or strider. Able to maintain conversation without audible strain.  Mild coughing appreciated Patient sounded congested and mildly hoarse on phone   Assessment and Plan:  Problem List Items Addressed This Visit   None Visit Diagnoses     Acute maxillary sinusitis, recurrence not specified    -  Primary Acute, new concern Reports congestion, sinus pressure, ear fullness, dizziness and headaches  Pt reports symptoms seem to be worsening in severity despite home management Will start Doxycycline 100 mg PO BID x 7days for sinusitis given her symptoms Recommended taking with food and plenty of water to prevent nausea and vomiting She can continue with symptomatic relief from OTC medications per preference Recommend follow up as needed for persistent or progressing symptoms     Relevant Medications   doxycycline (VIBRA-TABS) 100 MG tablet      Follow Up Instructions:    I discussed the assessment and treatment plan with the patient. The patient was provided an opportunity to ask questions and all were answered. The patient agreed with the plan and demonstrated an understanding of the instructions.   The patient was advised to call back or seek an in-person evaluation if the symptoms worsen or if the condition fails to improve as anticipated.  I provided 8 minutes of non-face-to-face  time during this encounter.  No follow-ups on file.   I, Amyre Segundo E Shana Younge, PA-C, have reviewed all documentation for this visit. The documentation on 01/27/22 for the exam, diagnosis, procedures, and orders are all accurate and complete.   Talitha Givens, MHS,  PA-C [facility]

## 2022-02-06 ENCOUNTER — Other Ambulatory Visit: Payer: Self-pay | Admitting: Nurse Practitioner

## 2022-02-06 DIAGNOSIS — F419 Anxiety disorder, unspecified: Secondary | ICD-10-CM

## 2022-02-06 DIAGNOSIS — M797 Fibromyalgia: Secondary | ICD-10-CM

## 2022-02-09 NOTE — Telephone Encounter (Signed)
Last ordered 05/23/21 to take x 10 days.  Requested Prescriptions  Pending Prescriptions Disp Refills  . gabapentin (NEURONTIN) 800 MG tablet [Pharmacy Med Name: GABAPENTIN '800MG'$  TABLETS] 270 tablet 0    Sig: TAKE 1 TABLET(800 MG) BY MOUTH THREE TIMES DAILY     Neurology: Anticonvulsants - gabapentin Failed - 02/06/2022  8:54 PM      Failed - Cr in normal range and within 360 days    Creatinine, Ser  Date Value Ref Range Status  10/03/2020 0.90 0.57 - 1.00 mg/dL Final         Passed - Completed PHQ-2 or PHQ-9 in the last 360 days      Passed - Valid encounter within last 12 months    Recent Outpatient Visits          1 week ago Acute maxillary sinusitis, recurrence not specified   [name], PA-C   5 months ago Chronic pain of right knee   [place], NP   6 months ago Muscle cramping   [name] Mec[place], Dani Gobble, PA-C   9 months ago Encounter for weight management   [place], NP   1 year ago Gastroesophageal reflux disease, unspecified whether esophagitis present   [place] [name], Scheryl Darter, NP      Future Appointments            In 1 week [name], MD [place]  . valACYclovir (VALTREX) 1000 MG tablet [Pharmacy Med Name: VALACYCLOVIR 1GM TABLETS] 20 tablet 0    Sig: TAKE 1 TABLET(1000 MG) BY MOUTH TWICE DAILY FOR 10 DAYS     Antimicrobials:  Antiviral Agents - Anti-Herpetic Passed - 02/06/2022  8:54 PM      Passed - Valid encounter within last 12 months    Recent Outpatient Visits          1 week ago Acute maxillary sinusitis, recurrence not specified   [place], PA-C   5 months ago Chronic pain of right knee   [facility] Jon Billings, NP   6 months ago Muscle cramping   [name] Mecum, Dani Gobble, PA-C   9 months ago  Encounter for weight management   [place], NP   1 year ago Gastroesophageal reflux disease, unspecified whether esophagitis present   [place], Scheryl Darter, NP      Future Appointments            In 1 week [name], MD [place]

## 2022-02-09 NOTE — Telephone Encounter (Signed)
Requested medication (s) are due for refill today: yes  Requested medication (s) are on the active medication list: yes  Last refill:  11/12/21 #270 0 refills  Future visit scheduled: no  Notes to clinic:  no refills remain. Do you want to refill Rx?     Requested Prescriptions  Pending Prescriptions Disp Refills   gabapentin (NEURONTIN) 800 MG tablet [Pharmacy Med Name: GABAPENTIN '800MG'$  TABLETS] 270 tablet 0    Sig: TAKE 1 TABLET(800 MG) BY MOUTH THREE TIMES DAILY     Neurology: Anticonvulsants - gabapentin Failed - 02/06/2022  8:54 PM      Failed - Cr in normal range and within 360 days    Creatinine, Ser  Date Value Ref Range Status  10/03/2020 0.90 0.57 - 1.00 mg/dL Final         Passed - Completed PHQ-2 or PHQ-9 in the last 360 days      Passed - Valid encounter within last 12 months    Recent Outpatient Visits           1 week ago Acute maxillary sinusitis, recurrence not specified   [place] Mecum, Erin E, PA-C   5 months ago Chronic pain of right knee   [facility] Jon Billings, NP   6 months ago Muscle cramping   [name] Mecum, Dani Gobble, PA-C   9 months ago Encounter for weight management   [facility] Jon Billings, NP   1 year ago Gastroesophageal reflux disease, unspecified whether esophagitis present   [employer] [place], NP       Future Appointments             In 1 week [name], MD [place] Refills   valACYclovir (VALTREX) 1000 MG tablet [Pharmacy Med Name: VALACYCLOVIR 1GM TABLETS] 20 tablet 0    Sig: TAKE 1 TABLET(1000 MG) BY MOUTH TWICE DAILY FOR 10 DAYS     Antimicrobials:  Antiviral Agents - Anti-Herpetic Passed - 02/06/2022  8:54 PM      Passed - Valid encounter within last 12 months    Recent Outpatient Visits           1 week ago Acute maxillary sinusitis, recurrence not specified    [place], PA-C   5 months ago Chronic pain of right knee   [place], NP   6 months ago Muscle cramping   [name] Mecum, Dani Gobble, PA-C   9 months ago Encounter for weight management   [place], NP   1 year ago Gastroesophageal reflux disease, unspecified whether esophagitis present   [place], Scheryl Darter, NP       Future Appointments             In 1 week [name], MD [place]

## 2022-02-17 ENCOUNTER — Telehealth (INDEPENDENT_AMBULATORY_CARE_PROVIDER_SITE_OTHER): Payer: Medicaid Other | Admitting: Gastroenterology

## 2022-02-17 DIAGNOSIS — K3184 Gastroparesis: Secondary | ICD-10-CM | POA: Diagnosis not present

## 2022-02-17 MED ORDER — METOCLOPRAMIDE HCL 5 MG PO TABS: 5.0000 mg | ORAL_TABLET | Freq: Three times a day (TID) | ORAL | 0 refills | Status: DC | PRN

## 2022-02-17 NOTE — Progress Notes (Unsigned)
There were no vitals taken for this visit.   Subjective:    Patient ID: Melissa Mendez, female    DOB: March 08, 1991, 31 y.o.   MRN: [id number]  HPI: Melissa Mendez is a 31 y.o. female presenting on 02/18/2022 for comprehensive medical examination. Current medical complaints include:{Blank single:19197::"none","***"}  She currently lives with: Menopausal Symptoms: {Blank single:19197::"yes","no"}  MOOD   Depression Screen done today and results listed below:     09/01/2021    3:22 PM 05/05/2021    4:18 PM 12/19/2020    9:10 AM 09/19/2020    2:25 PM 07/08/2020    3:53 PM  Depression screen PHQ 2/9  Decreased Interest 0 0 1 0 3  Down, Depressed, Hopeless 0 0 0 0 1  PHQ - 2 Score 0 0 1 0 4  Altered sleeping _0 Tired, decreased energy 1 0 _1 Change in appetite 0 0 0 0 0  Feeling bad or failure about yourself  0 1 0 1 3  Trouble concentrating 0 1 0 1 3  Moving slowly or fidgety/restless 0 0 0 0 0  Suicidal thoughts 0 0 0 0 0  PHQ-9 Score _2 Difficult doing work/chores Not difficult at all Not difficult at all Not difficult at all Not difficult at all Very difficult    The patient {has/does not have:19849} a history of falls. I {did/did not:19850} complete a risk assessment for falls. A plan of care for falls {was/was not:19852} documented.   Past Medical History:  Past Medical History:  Diagnosis Date   Anxiety    Depression    Fibromyalgia    Fibromyalgia    Gastroparesis    Migraines    Obesity     Surgical History:  Past Surgical History:  Procedure Laterality Date   ESOPHAGOGASTRODUODENOSCOPY (EGD) WITH PROPOFOL N/A 08/27/2020   Procedure: ESOPHAGOGASTRODUODENOSCOPY (EGD) WITH PROPOFOL;  Surgeon: Virgel Manifold, MD;  Location: [place];  Service: Endoscopy;  Laterality: N/A;   TONSILLECTOMY AND ADENOIDECTOMY  1993   WISDOM TOOTH EXTRACTION      Medications:  Current Outpatient Medications on File Prior to Visit  Medication Sig    atomoxetine (STRATTERA) 10 MG capsule Take 1 capsule (10 mg total) by mouth daily.   escitalopram (LEXAPRO) 5 MG tablet TAKE 1 TABLET(5 MG) BY MOUTH DAILY   famotidine (PEPCID) 40 MG tablet Take 1 tablet (40 mg total) by mouth at bedtime.   gabapentin (NEURONTIN) 800 MG tablet TAKE 1 TABLET(800 MG) BY MOUTH THREE TIMES DAILY   methocarbamol (ROBAXIN) 500 MG tablet Take 1 tablet (500 mg total) by mouth every 8 (eight) hours as needed for muscle spasms.   naloxone (NARCAN) nasal spray 4 mg/0.1 mL SMARTSIG:Both Nares   pantoprazole (PROTONIX) 40 MG tablet TAKE 1 TABLET(40 MG) BY MOUTH TWICE DAILY BEFORE A MEAL   Thiamine HCl (VITAMIN B-1) 250 MG tablet Take 250 mg by mouth daily.   No current facility-administered medications on file prior to visit.    Allergies:  Allergies  Allergen Reactions   Sulfa Antibiotics Other (See Comments)    Was told as a child that she is allergic to sulfa drugs; reaction unknown    Social History:  Social History   Socioeconomic History   Marital status: Married    Spouse name: Not on file   Number of children: Not on file   Years of education: Not on file  Highest education level: Not on file  Occupational History   Not on file  Tobacco Use   Smoking status: Every Day    Packs/day: 0.50    Types: Cigarettes   Smokeless tobacco: Never  Vaping Use   Vaping Use: Never used  Substance and Sexual Activity   Alcohol use: No    Alcohol/week: 0.0 standard drinks of alcohol   Drug use: No   Sexual activity: Yes  Other Topics Concern   Not on file  Social History Narrative   Single.   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   Enjoys painting, being with her friends and boyfriend.      Social Determinants of Health   Financial Resource Strain: Not on file  Food Insecurity: Not on file  Transportation Needs: Not on file  Physical Activity: Not on file  Stress: Not on file  Social Connections: Not on file   Intimate Partner Violence: Not on file   Social History   Tobacco Use  Smoking Status Every Day   Packs/day: 0.50   Types: Cigarettes  Smokeless Tobacco Never   Social History   Substance and Sexual Activity  Alcohol Use No   Alcohol/week: 0.0 standard drinks of alcohol    Family History:  Family History  Problem Relation Age of Onset   Hypertension Father    Multiple sclerosis Maternal Grandmother    Cancer Maternal Grandfather     Past medical history, surgical history, medications, allergies, family history and social history reviewed with patient today and changes made to appropriate areas of the chart.   ROS All other ROS negative except what is listed above and in the HPI.      Objective:    There were no vitals taken for this visit.  Wt Readings from Last 3 Encounters:  12/23/21 (!) 323 lb 12.8 oz (146.9 kg)  09/01/21 (!) 323 lb 12.8 oz (146.9 kg)  10/03/20 (!) 303 lb 4 oz (137.6 kg)    Physical Exam  Results for orders placed or performed in visit on 10/03/20  Comp Met (CMET)  Result Value Ref Range   Glucose 104 (H) 65 - 99 mg/dL   BUN 10 6 - 20 mg/dL   Creatinine, Ser 0.90 0.57 - 1.00 mg/dL   eGFR 88 >59 mL/min/1.73   BUN/Creatinine Ratio 11 9 - 23   Sodium 138 134 - 144 mmol/L   Potassium 4.8 3.5 - 5.2 mmol/L   Chloride 102 96 - 106 mmol/L   CO2 21 20 - 29 mmol/L   Calcium 9.6 8.7 - 10.2 mg/dL   Total Protein 6.9 6.0 - 8.5 g/dL   Albumin 4.4 3.8 - 4.8 g/dL   Globulin, Total 2.5 1.5 - 4.5 g/dL   Albumin/Globulin Ratio 1.8 1.2 - 2.2   Bilirubin Total 0.3 0.0 - 1.2 mg/dL   Alkaline Phosphatase 66 44 - 121 IU/L   AST 14 0 - 40 IU/L   ALT 13 0 - 32 IU/L  CBC w/Diff  Result Value Ref Range   WBC 7.0 3.4 - 10.8 x10E3/uL   RBC 4.58 3.77 - 5.28 x10E6/uL   Hemoglobin 12.6 11.1 - 15.9 g/dL   Hematocrit 38.4 34.0 - 46.6 %   MCV 84 79 - 97 fL   MCH 27.5 26.6 - 33.0 pg   MCHC 32.8 31.5 - 35.7 g/dL   RDW 12.9 11.7 - 15.4 %   Platelets 275 150 -  450 x10E3/uL  Neutrophils 48 Not Estab. %   Lymphs 37 Not Estab. %   Monocytes 7 Not Estab. %   Eos 7 Not Estab. %   Basos 1 Not Estab. %   Neutrophils Absolute 3.4 1.4 - 7.0 x10E3/uL   Lymphocytes Absolute 2.6 0.7 - 3.1 x10E3/uL   Monocytes Absolute 0.5 0.1 - 0.9 x10E3/uL   EOS (ABSOLUTE) 0.5 (H) 0.0 - 0.4 x10E3/uL   Basophils Absolute 0.1 0.0 - 0.2 x10E3/uL   Immature Granulocytes 0 Not Estab. %   Immature Grans (Abs) 0.0 0.0 - 0.1 x10E3/uL      Assessment & Plan:   Problem List Items Addressed This Visit   None    Follow up plan: No follow-ups on file.   LABORATORY TESTING:  - Pap smear: {Blank MQKMMN:81771::"HAF done","not applicable","up to date","done elsewhere"}  IMMUNIZATIONS:   - Tdap: Tetanus vaccination status reviewed: {tetanus status:315746}. - Influenza: {Blank single:19197::"Up to date","Administered today","Postponed to flu season","Refused","Given elsewhere"} - Pneumovax: {Blank single:19197::"Up to date","Administered today","Not applicable","Refused","Given elsewhere"} - Prevnar: {Blank single:19197::"Up to date","Administered today","Not applicable","Refused","Given elsewhere"} - COVID: {Blank single:19197::"Up to date","Administered today","Not applicable","Refused","Given elsewhere"} - HPV: {Blank single:19197::"Up to date","Administered today","Not applicable","Refused","Given elsewhere"} - Shingrix vaccine: {Blank single:19197::"Up to date","Administered today","Not applicable","Refused","Given elsewhere"}  SCREENING: -Mammogram: {Blank single:19197::"Up to date","Ordered today","Not applicable","Refused","Done elsewhere"}  - Colonoscopy: {Blank single:19197::"Up to date","Ordered today","Not applicable","Refused","Done elsewhere"}  - Bone Density: {Blank single:19197::"Up to date","Ordered today","Not applicable","Refused","Done elsewhere"}  -Hearing Test: {Blank single:19197::"Up to date","Ordered today","Not applicable","Refused","Done elsewhere"}   -Spirometry: {Blank single:19197::"Up to date","Ordered today","Not applicable","Refused","Done elsewhere"}   PATIENT COUNSELING:   Advised to take 1 mg of folate supplement per day if capable of pregnancy.   Sexuality: Discussed sexually transmitted diseases, partner selection, use of condoms, avoidance of unintended pregnancy  and contraceptive alternatives.   Advised to avoid cigarette smoking.  I discussed with the patient that most people either abstain from alcohol or drink within safe limits (<=14/week and <=4 drinks/occasion for males, <=7/weeks and <= 3 drinks/occasion for females) and that the risk for alcohol disorders and other health effects rises proportionally with the number of drinks per week and how often a drinker exceeds daily limits.  Discussed cessation/primary prevention of drug use and availability of treatment for abuse.   Diet: Encouraged to adjust caloric intake to maintain  or achieve ideal body weight, to reduce intake of dietary saturated fat and total fat, to limit sodium intake by avoiding high sodium foods and not adding table salt, and to maintain adequate dietary potassium and calcium preferably from fresh fruits, vegetables, and low-fat dairy products.    stressed the importance of regular exercise  Injury prevention: Discussed safety belts, safety helmets, smoke detector, smoking near bedding or upholstery.   Dental health: Discussed importance of regular tooth brushing, flossing, and dental visits.    NEXT PREVENTATIVE PHYSICAL DUE IN 1 YEAR. No follow-ups on file.

## 2022-02-17 NOTE — Progress Notes (Signed)
Melissa Mendez , MD [address]  [place]  [place], [place] [address]  Main: [phone]  Fax: [phone]   Primary Care Physician: Jon Billings, NP  Virtual Visit via Video Note  I connected with patient on 02/17/22 at  4:15 PM EDT by video and verified that I am speaking with the correct person using two identifiers.   I discussed the limitations, risks, security and privacy concerns of performing an evaluation and management service by video  and the availability of in person appointments. I also discussed with the patient that there may be a patient responsible charge related to this service. The patient expressed understanding and agreed to proceed.  Location of Patient: Home Location of Provider: Home Persons involved: Patient and provider only   History of Present Illness: Chief Complaint  Patient presents with   Emesis    HPI: Melissa Mendez is a 31 y.o. female   Summary of history :   She is a patient who was previously seen by Dr. Bonna Mendez last in May 2022.  Previously been seen for nausea and vomiting..  Prior endoscopy showed food in the stomach despite fasting and he has a history suggestive of gastroparesis.  Grade a esophagitis was noted.  Gastric emptying study did not show any delayed emptying. Since her last visit she continues to have on and off vomiting usually first thing in the morning preceded with a sense of nausea.  Can bring her food that is over already when she burps has a foul smell.  She takes pantoprazole 20 mg twice a day.    She is using marijuana which she smokes    Interval history 12/23/2021-02/17/2022.   Since last visit taking protonix BID and stopped all cannabis exposure but still throws up, not lost any weight    Current Outpatient Medications  Medication Sig Dispense Refill   atomoxetine (STRATTERA) 10 MG capsule Take 1 capsule (10 mg total) by mouth daily. 30 capsule 0   escitalopram (LEXAPRO) 5 MG tablet TAKE 1  TABLET(5 MG) BY MOUTH DAILY 90 tablet 1   famotidine (PEPCID) 40 MG tablet Take 1 tablet (40 mg total) by mouth at bedtime. 90 tablet 3   gabapentin (NEURONTIN) 800 MG tablet TAKE 1 TABLET(800 MG) BY MOUTH THREE TIMES DAILY 270 tablet 0   methocarbamol (ROBAXIN) 500 MG tablet Take 1 tablet (500 mg total) by mouth every 8 (eight) hours as needed for muscle spasms. 15 tablet 0   naloxone (NARCAN) nasal spray 4 mg/0.1 mL SMARTSIG:Both Nares     pantoprazole (PROTONIX) 40 MG tablet TAKE 1 TABLET(40 MG) BY MOUTH TWICE DAILY BEFORE A MEAL 180 tablet 1   Thiamine HCl (VITAMIN B-1) 250 MG tablet Take 250 mg by mouth daily.     No current facility-administered medications for this visit.    Allergies as of 02/17/2022 - Review Complete 01/27/2022  Allergen Reaction Noted   Sulfa antibiotics Other (See Comments) 03/27/2011    Review of Systems:    All systems reviewed and negative except where noted in HPI.  General Appearance:    Alert, cooperative, no distress, appears stated age  Head:    Normocephalic, without obvious abnormality, atraumatic  Eyes:    PERRL, conjunctiva/corneas clear,  Ears:    Grossly normal hearing    Neurologic:  Grossly normal    Observations/Objective:  Labs: CMP     Component Value Date/Time   NA 138 10/03/2020 1100   K 4.8 10/03/2020 1100  CL 102 10/03/2020 1100   CO2 21 10/03/2020 1100   GLUCOSE 104 (H) 10/03/2020 1100   GLUCOSE 84 11/05/2015 2053   BUN 10 10/03/2020 1100   CREATININE 0.90 10/03/2020 1100   CALCIUM 9.6 10/03/2020 1100   PROT 6.9 10/03/2020 1100   ALBUMIN 4.4 10/03/2020 1100   AST 14 10/03/2020 1100   ALT 13 10/03/2020 1100   ALKPHOS 66 10/03/2020 1100   BILITOT 0.3 10/03/2020 1100   GFRNONAA 78 12/01/2019 1449   GFRAA 90 12/01/2019 1449   Lab Results  Component Value Date   WBC 7.0 10/03/2020   HGB 12.6 10/03/2020   HCT 38.4 10/03/2020   MCV 84 10/03/2020   PLT 275 10/03/2020    Imaging Studies: No results  found.  Assessment and Plan:   Melissa Mendez is a 31 y.o. y/o female  here to follow-up for nausea vomiting.  History of endoscopy by Dr. Bonna Mendez showed food in the stomach. She has gastroparesis. Stopped all cannabis use and still has significant symptoms   Plan Discussed about gastroparesis diet , reduce portion size , when has acute symptoms, transition to liquid diet and use Reglan sparingly as needed. Discussed risks of Reglan including but not limited to cardiac side effects, tardive dyskinesia with aim to use at lowest dose for shortest period of time.    F/u in 3 months      I discussed the assessment and treatment plan with the patient. The patient was provided an opportunity to ask questions and all were answered. The patient agreed with the plan and demonstrated an understanding of the instructions.   The patient was advised to call back or seek an in-person evaluation if the symptoms worsen or if the condition fails to improve as anticipated.  I provided 25 minutes of face-to-face time during this encounter.  Dr Melissa Bellows MD,MRCP [facility]) Gastroenterology/Hepatology Pager: [phone]   Speech recognition software was used to dictate this note.

## 2022-02-18 ENCOUNTER — Ambulatory Visit (INDEPENDENT_AMBULATORY_CARE_PROVIDER_SITE_OTHER): Payer: Medicaid Other | Admitting: Nurse Practitioner

## 2022-02-18 ENCOUNTER — Encounter: Payer: Self-pay | Admitting: Nurse Practitioner

## 2022-02-18 VITALS — BP 164/120 | HR 96 | Temp 98.4°F | Ht 68.5 in | Wt 314.8 lb

## 2022-02-18 DIAGNOSIS — I10 Essential (primary) hypertension: Secondary | ICD-10-CM | POA: Diagnosis not present

## 2022-02-18 DIAGNOSIS — M797 Fibromyalgia: Secondary | ICD-10-CM | POA: Diagnosis not present

## 2022-02-18 DIAGNOSIS — F419 Anxiety disorder, unspecified: Secondary | ICD-10-CM | POA: Diagnosis not present

## 2022-02-18 DIAGNOSIS — F32A Depression, unspecified: Secondary | ICD-10-CM

## 2022-02-18 DIAGNOSIS — Z1159 Encounter for screening for other viral diseases: Secondary | ICD-10-CM

## 2022-02-18 DIAGNOSIS — M25562 Pain in left knee: Secondary | ICD-10-CM

## 2022-02-18 DIAGNOSIS — Z0000 Encounter for general adult medical examination without abnormal findings: Secondary | ICD-10-CM

## 2022-02-18 DIAGNOSIS — Z23 Encounter for immunization: Secondary | ICD-10-CM

## 2022-02-18 DIAGNOSIS — G8929 Other chronic pain: Secondary | ICD-10-CM

## 2022-02-18 DIAGNOSIS — Z136 Encounter for screening for cardiovascular disorders: Secondary | ICD-10-CM

## 2022-02-18 LAB — URINALYSIS, ROUTINE W REFLEX MICROSCOPIC
Bilirubin, UA: NEGATIVE
Glucose, UA: NEGATIVE
Ketones, UA: NEGATIVE
Leukocytes,UA: NEGATIVE
Nitrite, UA: NEGATIVE
Protein,UA: NEGATIVE
RBC, UA: NEGATIVE
Specific Gravity, UA: 1.02 (ref 1.005–1.030)
Urobilinogen, Ur: 2 mg/dL — ABNORMAL HIGH (ref 0.2–1.0)
pH, UA: 6.5 (ref 5.0–7.5)

## 2022-02-18 MED ORDER — VALSARTAN 80 MG PO TABS: 80.0000 mg | ORAL_TABLET | Freq: Every day | ORAL | 0 refills | Status: DC

## 2022-02-18 MED ORDER — ESCITALOPRAM OXALATE 10 MG PO TABS: 10.0000 mg | ORAL_TABLET | Freq: Every day | ORAL | 0 refills | Status: DC

## 2022-02-18 MED ORDER — VALACYCLOVIR HCL 1 G PO TABS: 1000.0000 mg | ORAL_TABLET | Freq: Two times a day (BID) | ORAL | 1 refills | Status: DC

## 2022-02-18 MED ORDER — GABAPENTIN 800 MG PO TABS: ORAL_TABLET | ORAL | 1 refills | Status: DC

## 2022-02-18 NOTE — Assessment & Plan Note (Signed)
Chronic. Not well controlled.  Will increase Lexapro to '10mg'$ .  Follow up in 1 month.  Call sooner if concerns arise.

## 2022-02-18 NOTE — Assessment & Plan Note (Signed)
Chronic. Not well controlled.  Will start Valsartan '80mg'$  daily.  Side effects and benefits of medication discussed during visit today.  Discussed signs and symptoms of hypertensive urgency and when to see higher level of care.  Labs ordered.  Follow up in 1 month.

## 2022-02-18 NOTE — Assessment & Plan Note (Signed)
Chronic. Ongoing.  Refilled Gabapentin '800mg'$  TID.  Follow up in 1 month for reevaluation.  Call sooner if concerns arise.

## 2022-02-19 LAB — LIPID PANEL
Chol/HDL Ratio: 4.3 ratio (ref 0.0–4.4)
Cholesterol, Total: 173 mg/dL (ref 100–199)
HDL: 40 mg/dL (ref >39)
LDL Chol Calc (NIH): 107 mg/dL — ABNORMAL HIGH (ref 0–99)
Triglycerides: 144 mg/dL (ref 0–149)
VLDL Cholesterol Cal: 26 mg/dL (ref 5–40)

## 2022-02-19 LAB — COMPREHENSIVE METABOLIC PANEL
ALT: 27 IU/L (ref 0–32)
AST: 19 IU/L (ref 0–40)
Albumin/Globulin Ratio: 1.5 (ref 1.2–2.2)
Albumin: 4.1 g/dL (ref 3.9–4.9)
Alkaline Phosphatase: 83 IU/L (ref 44–121)
BUN/Creatinine Ratio: 8 — ABNORMAL LOW (ref 9–23)
BUN: 7 mg/dL (ref 6–20)
Bilirubin Total: <0.2 mg/dL (ref 0.0–1.2)
CO2: 25 mmol/L (ref 20–29)
Calcium: 9.1 mg/dL (ref 8.7–10.2)
Chloride: 102 mmol/L (ref 96–106)
Creatinine, Ser: 0.87 mg/dL (ref 0.57–1.00)
Globulin, Total: 2.8 g/dL (ref 1.5–4.5)
Glucose: 106 mg/dL — ABNORMAL HIGH (ref 70–99)
Potassium: 4.6 mmol/L (ref 3.5–5.2)
Sodium: 140 mmol/L (ref 134–144)
Total Protein: 6.9 g/dL (ref 6.0–8.5)
eGFR: 91 mL/min/{1.73_m2} (ref >59)

## 2022-02-19 LAB — CBC WITH DIFFERENTIAL/PLATELET
Basophils Absolute: 0.1 10*3/uL (ref 0.0–0.2)
Basos: 1 %
EOS (ABSOLUTE): 0.3 10*3/uL (ref 0.0–0.4)
Eos: 5 %
Hematocrit: 36.7 % (ref 34.0–46.6)
Hemoglobin: 11.4 g/dL (ref 11.1–15.9)
Immature Grans (Abs): 0 10*3/uL (ref 0.0–0.1)
Immature Granulocytes: 0 %
Lymphocytes Absolute: 2.1 10*3/uL (ref 0.7–3.1)
Lymphs: 33 %
MCH: 25.6 pg — ABNORMAL LOW (ref 26.6–33.0)
MCHC: 31.1 g/dL — ABNORMAL LOW (ref 31.5–35.7)
MCV: 82 fL (ref 79–97)
Monocytes Absolute: 0.4 10*3/uL (ref 0.1–0.9)
Monocytes: 7 %
Neutrophils Absolute: 3.6 10*3/uL (ref 1.4–7.0)
Neutrophils: 54 %
Platelets: 307 10*3/uL (ref 150–450)
RBC: 4.46 x10E6/uL (ref 3.77–5.28)
RDW: 15.1 % (ref 11.7–15.4)
WBC: 6.5 10*3/uL (ref 3.4–10.8)

## 2022-02-19 LAB — HEPATITIS C ANTIBODY: Hep C Virus Ab: NONREACTIVE

## 2022-02-19 LAB — TSH: TSH: 2.13 u[IU]/mL (ref 0.450–4.500)

## 2022-02-19 NOTE — Progress Notes (Signed)
Hi Nimo. It was nice to see you yesterday.  Your lab work looks good.  Your red blood cells are slightly lower than normal.  This can indicate an iron deficiency.  I recommend you start Ferrous Sulfate '65mg'$  daily to help with this.  It can cause constipation so it helps to take colace with it.  Your liver, kidneys, and electrolytes look good.  Cholesterol is very slightly elevated.  Otherwise your lab work is unremarkable.  Your Shanon Ace is elevate on the urine but there is nothing else indicating that further testing needs to be done.  Continue with your current medication regimen.  Follow up as discussed.  Please let me know if you have any questions.

## 2022-03-23 NOTE — Progress Notes (Deleted)
There were no vitals taken for this visit.   Subjective:    Patient ID: Melissa Mendez, female    DOB: 04/14/1991, 31 y.o.   MRN: [id number]  HPI: Melissa Mendez is a 31 y.o. female  No chief complaint on file.   Patient states she is still vomiting everyday and she is continuing to gain weight.  Patient states she had a break for awhile but then it started again.  If she doesn't take the pantoprazole she is very sick.  When she misses a dose it is very bad.    DEPRESSION Patient states she feels like the duloxetine is making her muscle cramps. Her dose was decreased at last visit and the muscle cramps have improved.   She does not want to go off antidepressants.  She has tried lexapro when she was 13.  Her current dose of duloxetine is 17m.  Relevant past medical, surgical, family and social history reviewed and updated as indicated. Interim medical history since our last visit reviewed. Allergies and medications reviewed and updated.  Review of Systems  Gastrointestinal:  Positive for vomiting.  Musculoskeletal:        Knee pain  Psychiatric/Behavioral:  Positive for dysphoric mood. The patient is nervous/anxious.     Per HPI unless specifically indicated above     Objective:    There were no vitals taken for this visit.  Wt Readings from Last 3 Encounters:  02/18/22 (!) 314 lb 12.8 oz (142.8 kg)  12/23/21 (!) 323 lb 12.8 oz (146.9 kg)  09/01/21 (!) 323 lb 12.8 oz (146.9 kg)    Physical Exam Vitals and nursing note reviewed.  Constitutional:      General: She is not in acute distress.    Appearance: Normal appearance. She is obese. She is not ill-appearing, toxic-appearing or diaphoretic.  HENT:     Head: Normocephalic.     Right Ear: External ear normal.     Left Ear: External ear normal.     Nose: Nose normal.     Mouth/Throat:     Mouth: Mucous membranes are moist.     Pharynx: Oropharynx is clear.  Eyes:     General:        Right eye: No discharge.         Left eye: No discharge.     Extraocular Movements: Extraocular movements intact.     Conjunctiva/sclera: Conjunctivae normal.     Pupils: Pupils are equal, round, and reactive to light.  Cardiovascular:     Rate and Rhythm: Normal rate and regular rhythm.     Heart sounds: No murmur heard. Pulmonary:     Effort: Pulmonary effort is normal. No respiratory distress.     Breath sounds: Normal breath sounds. No wheezing or rales.  Musculoskeletal:     Cervical back: Normal range of motion and neck supple.  Skin:    General: Skin is warm and dry.     Capillary Refill: Capillary refill takes less than 2 seconds.  Neurological:     General: No focal deficit present.     Mental Status: She is alert and oriented to person, place, and time. Mental status is at baseline.  Psychiatric:        Mood and Affect: Mood normal.        Behavior: Behavior normal.        Thought Content: Thought content normal.        Judgment: Judgment normal.     Results  for orders placed or performed in visit on 02/18/22  CBC with Differential/Platelet  Result Value Ref Range   WBC 6.5 3.4 - 10.8 x10E3/uL   RBC 4.46 3.77 - 5.28 x10E6/uL   Hemoglobin 11.4 11.1 - 15.9 g/dL   Hematocrit 36.7 34.0 - 46.6 %   MCV 82 79 - 97 fL   MCH 25.6 (L) 26.6 - 33.0 pg   MCHC 31.1 (L) 31.5 - 35.7 g/dL   RDW 15.1 11.7 - 15.4 %   Platelets 307 150 - 450 x10E3/uL   Neutrophils 54 Not Estab. %   Lymphs 33 Not Estab. %   Monocytes 7 Not Estab. %   Eos 5 Not Estab. %   Basos 1 Not Estab. %   Neutrophils Absolute 3.6 1.4 - 7.0 x10E3/uL   Lymphocytes Absolute 2.1 0.7 - 3.1 x10E3/uL   Monocytes Absolute 0.4 0.1 - 0.9 x10E3/uL   EOS (ABSOLUTE) 0.3 0.0 - 0.4 x10E3/uL   Basophils Absolute 0.1 0.0 - 0.2 x10E3/uL   Immature Granulocytes 0 Not Estab. %   Immature Grans (Abs) 0.0 0.0 - 0.1 x10E3/uL  Comprehensive metabolic panel  Result Value Ref Range   Glucose 106 (H) 70 - 99 mg/dL   BUN 7 6 - 20 mg/dL   Creatinine, Ser 0.87  0.57 - 1.00 mg/dL   eGFR 91 >59 mL/min/1.73   BUN/Creatinine Ratio 8 (L) 9 - 23   Sodium 140 134 - 144 mmol/L   Potassium 4.6 3.5 - 5.2 mmol/L   Chloride 102 96 - 106 mmol/L   CO2 25 20 - 29 mmol/L   Calcium 9.1 8.7 - 10.2 mg/dL   Total Protein 6.9 6.0 - 8.5 g/dL   Albumin 4.1 3.9 - 4.9 g/dL   Globulin, Total 2.8 1.5 - 4.5 g/dL   Albumin/Globulin Ratio 1.5 1.2 - 2.2   Bilirubin Total <0.2 0.0 - 1.2 mg/dL   Alkaline Phosphatase 83 44 - 121 IU/L   AST 19 0 - 40 IU/L   ALT 27 0 - 32 IU/L  Lipid panel  Result Value Ref Range   Cholesterol, Total 173 100 - 199 mg/dL   Triglycerides 144 0 - 149 mg/dL   HDL 40 >39 mg/dL   VLDL Cholesterol Cal 26 5 - 40 mg/dL   LDL Chol Calc (NIH) 107 (H) 0 - 99 mg/dL   Chol/HDL Ratio 4.3 0.0 - 4.4 ratio  TSH  Result Value Ref Range   TSH 2.130 0.450 - 4.500 uIU/mL  Urinalysis, Routine w reflex microscopic  Result Value Ref Range   Specific Gravity, UA 1.020 1.005 - 1.030   pH, UA 6.5 5.0 - 7.5   Color, UA Yellow Yellow   Appearance Ur Clear Clear   Leukocytes,UA Negative Negative   Protein,UA Negative Negative/Trace   Glucose, UA Negative Negative   Ketones, UA Negative Negative   RBC, UA Negative Negative   Bilirubin, UA Negative Negative   Urobilinogen, Ur 2.0 (H) 0.2 - 1.0 mg/dL   Nitrite, UA Negative Negative   Microscopic Examination Comment   Hepatitis C Antibody  Result Value Ref Range   Hep C Virus Ab Non Reactive Non Reactive      Assessment & Plan:   Problem List Items Addressed This Visit       Cardiovascular and Mediastinum   Hypertension - Primary     Other   Anxiety and depression     Follow up plan: No follow-ups on file.

## 2022-03-24 ENCOUNTER — Ambulatory Visit: Payer: Medicaid Other | Admitting: Nurse Practitioner

## 2022-03-24 DIAGNOSIS — F419 Anxiety disorder, unspecified: Secondary | ICD-10-CM

## 2022-03-24 DIAGNOSIS — I10 Essential (primary) hypertension: Secondary | ICD-10-CM

## 2022-04-17 ENCOUNTER — Other Ambulatory Visit: Payer: Self-pay | Admitting: Nurse Practitioner

## 2022-04-18 NOTE — Telephone Encounter (Signed)
Unable to refill per protocol, Rx expired. Medication was discontinued 02/18/22 by PCP due to dose change. Will refuse.  Requested Prescriptions  Pending Prescriptions Disp Refills   escitalopram (LEXAPRO) 5 MG tablet [Pharmacy Med Name: ESCITALOPRAM '5MG'$  TABLETS] 90 tablet 1    Sig: TAKE 1 TABLET(5 MG) BY MOUTH DAILY     Psychiatry:  Antidepressants - SSRI Passed - 04/17/2022  3:55 PM      Passed - Completed PHQ-2 or PHQ-9 in the last 360 days      Passed - Valid encounter within last 6 months    Recent Outpatient Visits           1 month ago Annual physical exam   [place], NP   2 months ago Acute maxillary sinusitis, recurrence not specified   [place], PA-C   7 months ago Chronic pain of right knee   [facility] Jon Billings, NP   8 months ago Muscle cramping   [name] Mecum, Dani Gobble, PA-C   11 months ago Encounter for [employer] management   [place], NP               escitalopram (LEXAPRO) 10 MG tablet [Pharmacy Med Name: ESCITALOPRAM '10MG'$  TABLETS] 60 tablet 0    Sig: TAKE 1 TABLET(10 MG) BY MOUTH DAILY     Psychiatry:  Antidepressants - SSRI Passed - 04/17/2022  3:55 PM      Passed - Completed PHQ-2 or PHQ-9 in the last 360 days      Passed - Valid encounter within last 6 months    Recent Outpatient Visits           1 month ago Annual physical exam   [place], NP   2 months ago Acute maxillary sinusitis, recurrence not specified   [place], PA-C   7 months ago Chronic pain of right knee   [place], NP   8 months ago Muscle cramping   [name] Mecum, Dani Gobble, PA-C   11 months ago Encounter for weight management   [facility] Jon Billings, NP

## 2022-05-12 DIAGNOSIS — M2242 Chondromalacia patellae, left knee: Secondary | ICD-10-CM | POA: Insufficient documentation

## 2022-05-14 DIAGNOSIS — M25561 Pain in right knee: Secondary | ICD-10-CM | POA: Insufficient documentation

## 2022-06-18 ENCOUNTER — Encounter: Payer: Self-pay | Admitting: Family Medicine

## 2022-06-18 ENCOUNTER — Ambulatory Visit (INDEPENDENT_AMBULATORY_CARE_PROVIDER_SITE_OTHER): Payer: Medicaid Other | Admitting: Family Medicine

## 2022-06-18 VITALS — BP 155/94 | HR 85 | Temp 98.2°F | Ht 68.5 in | Wt 311.6 lb

## 2022-06-18 DIAGNOSIS — D1724 Benign lipomatous neoplasm of skin and subcutaneous tissue of left leg: Secondary | ICD-10-CM

## 2022-06-18 DIAGNOSIS — F32A Depression, unspecified: Secondary | ICD-10-CM

## 2022-06-18 DIAGNOSIS — M2550 Pain in unspecified joint: Secondary | ICD-10-CM

## 2022-06-18 DIAGNOSIS — R41840 Attention and concentration deficit: Secondary | ICD-10-CM | POA: Diagnosis not present

## 2022-06-18 DIAGNOSIS — I10 Essential (primary) hypertension: Secondary | ICD-10-CM

## 2022-06-18 DIAGNOSIS — F419 Anxiety disorder, unspecified: Secondary | ICD-10-CM | POA: Diagnosis not present

## 2022-06-18 LAB — BAYER DCA HB A1C WAIVED: HB A1C (BAYER DCA - WAIVED): 5.7 % — ABNORMAL HIGH (ref 4.8–5.6)

## 2022-06-18 MED ORDER — DULOXETINE HCL 30 MG PO CPEP: 60.0000 mg | ORAL_CAPSULE | Freq: Every day | ORAL | 1 refills | Status: DC

## 2022-06-18 NOTE — Progress Notes (Signed)
BP (!) 155/94   Pulse 85   Temp 98.2 F (36.8 C) (Oral)   Ht 5' 8.5" (1.74 m)   Wt (!) 311 lb 9.6 oz (141.3 kg)   SpO2 100%   BMI 46.69 kg/m    Subjective:    Patient ID: Melissa Mendez, female    DOB: December 09, 1990, 32 y.o.   MRN: [id number]  HPI: PATRICA SPRATT is a 32 y.o. female  Chief Complaint  Patient presents with   Depression    Patient says she wants to discuss possibly stopping her Lexapro prescription as she is experiencing cramps and says it wasn't really working for her.    Thyroid Problem    Patient says she would like to discuss having a referral placed for Endocrinologist as she is experiencing a lot of things related to her Thyroid.    DEPRESSION Mood status: uncontrolled Satisfied with current treatment?: no Symptom severity: moderate  Duration of current treatment : months Side effects: yes- leg cramps Medication compliance: good compliance Psychotherapy/counseling: no Previous psychiatric medications: cymbalta, lexapro Depressed mood: yes Anxious mood: yes Anhedonia: no Significant weight loss or gain: no Insomnia: no  Fatigue: yes Feelings of worthlessness or guilt: yes Impaired concentration/indecisiveness: yes Suicidal ideations: no Hopelessness: no Crying spells: yes    06/18/2022   10:26 AM 02/18/2022   10:24 AM 09/01/2021    3:22 PM 05/05/2021    4:18 PM 12/19/2020    9:10 AM  Depression screen PHQ 2/9  Decreased Interest 3 1 0 0 1  Down, Depressed, Hopeless 2 1 0 0 0  PHQ - 2 Score 5 2 0 0 1  Altered sleeping '2 1 2 3 1  '$ Tired, decreased energy '3 1 1 '$ 0 1  Change in appetite 0 1 0 0 0  Feeling bad or failure about yourself  3 1 0 1 0  Trouble concentrating 3 1 0 1 0  Moving slowly or fidgety/restless 0 0 0 0 0  Suicidal thoughts 1 0 0 0 0  PHQ-9 Score '17 7 3 5 3  '$ Difficult doing work/chores Very difficult Somewhat difficult Not difficult at all Not difficult at all Not difficult at all   HYPERTENSION- stopped taking her blood pressure  medicine.   Hypertension status: uncontrolled  Satisfied with current treatment? no Duration of hypertension: chronic BP monitoring frequency:  not checking BP medication side effects:  no Medication compliance: poor compliance Previous BP meds:valsartan Aspirin: no Recurrent headaches: yes Visual changes: no Palpitations: no Dyspnea: no Chest pain: no Lower extremity edema: no Dizzy/lightheaded: no   Relevant past medical, surgical, family and social history reviewed and updated as indicated. Interim medical history since our last visit reviewed. Allergies and medications reviewed and updated.  Review of Systems  Constitutional: Negative.   Respiratory: Negative.    Cardiovascular: Negative.   Gastrointestinal:  Positive for nausea and vomiting. Negative for abdominal distention, abdominal pain, anal bleeding, blood in stool, constipation, diarrhea and rectal pain.  Musculoskeletal:  Positive for myalgias. Negative for arthralgias, back pain, gait problem, joint swelling, neck pain and neck stiffness.  Skin: Negative.   Psychiatric/Behavioral:  Positive for dysphoric mood. Negative for agitation, behavioral problems, confusion, decreased concentration, hallucinations, self-injury, sleep disturbance and suicidal ideas. The patient is nervous/anxious. The patient is not hyperactive.     Per HPI unless specifically indicated above     Objective:    BP (!) 155/94   Pulse 85   Temp 98.2 F (36.8 C) (Oral)  Ht 5' 8.5" (1.74 m)   Wt (!) 311 lb 9.6 oz (141.3 kg)   SpO2 100%   BMI 46.69 kg/m   Wt Readings from Last 3 Encounters:  06/18/22 (!) 311 lb 9.6 oz (141.3 kg)  02/18/22 (!) 314 lb 12.8 oz (142.8 kg)  12/23/21 (!) 323 lb 12.8 oz (146.9 kg)    Physical Exam Vitals and nursing note reviewed.  Constitutional:      General: She is not in acute distress.    Appearance: Normal appearance. She is obese. She is not ill-appearing, toxic-appearing or diaphoretic.  HENT:      Head: Normocephalic and atraumatic.     Right Ear: External ear normal.     Left Ear: External ear normal.     Nose: Nose normal.     Mouth/Throat:     Mouth: Mucous membranes are moist.     Pharynx: Oropharynx is clear.  Eyes:     General: No scleral icterus.       Right eye: No discharge.        Left eye: No discharge.     Extraocular Movements: Extraocular movements intact.     Conjunctiva/sclera: Conjunctivae normal.     Pupils: Pupils are equal, round, and reactive to light.  Cardiovascular:     Rate and Rhythm: Normal rate and regular rhythm.     Pulses: Normal pulses.     Heart sounds: Normal heart sounds. No murmur heard.    No friction rub. No gallop.  Pulmonary:     Effort: Pulmonary effort is normal. No respiratory distress.     Breath sounds: Normal breath sounds. No stridor. No wheezing, rhonchi or rales.  Chest:     Chest wall: No tenderness.  Musculoskeletal:        General: Normal range of motion.     Cervical back: Normal range of motion and neck supple.  Skin:    General: Skin is warm and dry.     Capillary Refill: Capillary refill takes less than 2 seconds.     Coloration: Skin is not jaundiced or pale.     Findings: No bruising, erythema, lesion or rash.  Neurological:     General: No focal deficit present.     Mental Status: She is alert and oriented to person, place, and time. Mental status is at baseline.  Psychiatric:        Mood and Affect: Mood normal.        Behavior: Behavior normal.        Thought Content: Thought content normal.        Judgment: Judgment normal.     Results for orders placed or performed in visit on 06/18/22  Thyroid Panel With TSH  Result Value Ref Range   TSH 3.480 0.450 - 4.500 uIU/mL   T4, Total 9.5 4.5 - 12.0 ug/dL   T3 Uptake Ratio 28 24 - 39 %   Free Thyroxine Index 2.7 1.2 - 4.9  Thyroid Peroxidase Antibody  Result Value Ref Range   Thyroperoxidase Ab SerPl-aCnc <9 0 - 34 IU/mL  RA  Qn+CCP(IgG/A)+SjoSSA+SjoSSB  Result Value Ref Range   Rhuematoid fact SerPl-aCnc <10.0 XX123456 IU/mL   Cyclic Citrullin Peptide Ab 6 0 - 19 units  Comprehensive metabolic panel  Result Value Ref Range   Glucose 101 (H) 70 - 99 mg/dL   BUN 16 6 - 20 mg/dL   Creatinine, Ser 1.02 (H) 0.57 - 1.00 mg/dL   eGFR 74 >59 mL/min/1.73  BUN/Creatinine Ratio 16 9 - 23   Sodium 137 134 - 144 mmol/L   Potassium 4.4 3.5 - 5.2 mmol/L   Chloride 100 96 - 106 mmol/L   CO2 20 20 - 29 mmol/L   Calcium 9.5 8.7 - 10.2 mg/dL   Total Protein 7.4 6.0 - 8.5 g/dL   Albumin 4.7 3.9 - 4.9 g/dL   Globulin, Total 2.7 1.5 - 4.5 g/dL   Albumin/Globulin Ratio 1.7 1.2 - 2.2   Bilirubin Total <0.2 0.0 - 1.2 mg/dL   Alkaline Phosphatase 81 44 - 121 IU/L   AST 24 0 - 40 IU/L   ALT 27 0 - 32 IU/L  CBC with Differential/Platelet  Result Value Ref Range   WBC 9.1 3.4 - 10.8 x10E3/uL   RBC 4.73 3.77 - 5.28 x10E6/uL   Hemoglobin 11.9 11.1 - 15.9 g/dL   Hematocrit 36.8 34.0 - 46.6 %   MCV 78 (L) 79 - 97 fL   MCH 25.2 (L) 26.6 - 33.0 pg   MCHC 32.3 31.5 - 35.7 g/dL   RDW 16.0 (H) 11.7 - 15.4 %   Platelets 305 150 - 450 x10E3/uL   Neutrophils 49 Not Estab. %   Lymphs 39 Not Estab. %   Monocytes 7 Not Estab. %   Eos 4 Not Estab. %   Basos 1 Not Estab. %   Neutrophils Absolute 4.4 1.4 - 7.0 x10E3/uL   Lymphocytes Absolute 3.5 (H) 0.7 - 3.1 x10E3/uL   Monocytes Absolute 0.6 0.1 - 0.9 x10E3/uL   EOS (ABSOLUTE) 0.4 0.0 - 0.4 x10E3/uL   Basophils Absolute 0.1 0.0 - 0.2 x10E3/uL   Immature Granulocytes 0 Not Estab. %   Immature Grans (Abs) 0.0 0.0 - 0.1 x10E3/uL  Bayer DCA Hb A1c Waived  Result Value Ref Range   HB A1C (BAYER DCA - WAIVED) 5.7 (H) 4.8 - 5.6 %  Lipid Panel w/o Chol/HDL Ratio  Result Value Ref Range   Cholesterol, Total 187 100 - 199 mg/dL   Triglycerides 140 0 - 149 mg/dL   HDL 40 >39 mg/dL   VLDL Cholesterol Cal 25 5 - 40 mg/dL   LDL Chol Calc (NIH) 122 (H) 0 - 99 mg/dL  VITAMIN D 25 Hydroxy  (Vit-D Deficiency, Fractures)  Result Value Ref Range   Vit D, 25-Hydroxy 17.1 (L) 30.0 - 100.0 ng/mL  ANA+ENA+DNA/DS+Antich+Centr  Result Value Ref Range   ANA Titer 1 Negative    dsDNA Ab <1 0 - 9 IU/mL   ENA RNP Ab <0.2 0.0 - 0.9 AI   ENA SM Ab Ser-aCnc <0.2 0.0 - 0.9 AI   Scleroderma (Scl-70) (ENA) Antibody, IgG <0.2 0.0 - 0.9 AI   ENA SSA (RO) Ab <0.2 0.0 - 0.9 AI   ENA SSB (LA) Ab <0.2 0.0 - 0.9 AI   Chromatin Ab SerPl-aCnc <0.2 0.0 - 0.9 AI   Anti JO-1 <0.2 0.0 - 0.9 AI   Centromere Ab Screen <0.2 0.0 - 0.9 AI   See below: Comment   CK  Result Value Ref Range   Total CK 127 32 - 182 U/L  Uric acid  Result Value Ref Range   Uric Acid 4.6 2.6 - 6.2 mg/dL  Ehrlichia antibody panel  Result Value Ref Range   E.Chaffeensis (HME) IgG Negative Neg:<1:64   E. Chaffeensis (HME) IgM Titer Negative Neg:<1:20   HGE IgG Titer Negative Neg:<1:64   HGE IgM Titer Negative Neg:<1:20   Result Comment: Comment   Spotted Fever Group Antibodies  Result Value Ref Range   Spotted Fever Group IgG <1:64 Neg:<1:64   Spotted Fever Group IgM <1:64 Neg:<1:64   Result Comment Comment       Assessment & Plan:   Problem List Items Addressed This Visit       Cardiovascular and Mediastinum   Hypertension    Has not been taking her vasartan. Will restart it. Recheck 2 weeks with PCP.        Other   Anxiety and depression - Primary    Not under good control. Will change her lexapro to cymbalta to '60mg'$ . Referral to psychiatry placed today. Call with any concerns.       Relevant Medications   DULoxetine (CYMBALTA) 30 MG capsule   Other Relevant Orders   Ambulatory referral to Psychiatry   Ambulatory referral to Psychology   Other Visit Diagnoses     Arthralgia, unspecified joint       Will check labs. Await results. Treat as needed.   Relevant Orders   Thyroid Panel With TSH (Completed)   Thyroid Peroxidase Antibody (Completed)   RA Qn+CCP(IgG/A)+SjoSSA+SjoSSB (Completed)    Comprehensive metabolic panel (Completed)   CBC with Differential/Platelet (Completed)   Bayer DCA Hb A1c Waived (Completed)   Lipid Panel w/o Chol/HDL Ratio (Completed)   VITAMIN D 25 Hydroxy (Vit-D Deficiency, Fractures) (Completed)   ANA+ENA+DNA/DS+Antich+Centr (Completed)   CK (Completed)   Uric acid (Completed)   Ehrlichia antibody panel (Completed)   Comprehensive metabolic panel   CBC with Differential/Platelet   Spotted Fever Group Antibodies (Completed)   Concentration deficit       Referral for neuropsych testing placed today. Continue to monitor.   Relevant Orders   Ambulatory referral to Psychiatry   Ambulatory referral to Psychology   Lipoma of left lower extremity       Reassured patient. Does not want to have it removed at this time.        Follow up plan: Return for 2 weeks with PCP.

## 2022-06-20 LAB — LIPID PANEL W/O CHOL/HDL RATIO
Cholesterol, Total: 187 mg/dL (ref 100–199)
HDL: 40 mg/dL (ref >39)
LDL Chol Calc (NIH): 122 mg/dL — ABNORMAL HIGH (ref 0–99)
Triglycerides: 140 mg/dL (ref 0–149)
VLDL Cholesterol Cal: 25 mg/dL (ref 5–40)

## 2022-06-20 LAB — COMPREHENSIVE METABOLIC PANEL
ALT: 27 IU/L (ref 0–32)
AST: 24 IU/L (ref 0–40)
Albumin/Globulin Ratio: 1.7 (ref 1.2–2.2)
Albumin: 4.7 g/dL (ref 3.9–4.9)
Alkaline Phosphatase: 81 IU/L (ref 44–121)
BUN/Creatinine Ratio: 16 (ref 9–23)
BUN: 16 mg/dL (ref 6–20)
Bilirubin Total: <0.2 mg/dL (ref 0.0–1.2)
CO2: 20 mmol/L (ref 20–29)
Calcium: 9.5 mg/dL (ref 8.7–10.2)
Chloride: 100 mmol/L (ref 96–106)
Creatinine, Ser: 1.02 mg/dL — ABNORMAL HIGH (ref 0.57–1.00)
Globulin, Total: 2.7 g/dL (ref 1.5–4.5)
Glucose: 101 mg/dL — ABNORMAL HIGH (ref 70–99)
Potassium: 4.4 mmol/L (ref 3.5–5.2)
Sodium: 137 mmol/L (ref 134–144)
Total Protein: 7.4 g/dL (ref 6.0–8.5)
eGFR: 74 mL/min/{1.73_m2} (ref >59)

## 2022-06-20 LAB — VITAMIN D 25 HYDROXY (VIT D DEFICIENCY, FRACTURES): Vit D, 25-Hydroxy: 17.1 ng/mL — ABNORMAL LOW (ref 30.0–100.0)

## 2022-06-20 LAB — CBC WITH DIFFERENTIAL/PLATELET
Basophils Absolute: 0.1 10*3/uL (ref 0.0–0.2)
Basos: 1 %
EOS (ABSOLUTE): 0.4 10*3/uL (ref 0.0–0.4)
Eos: 4 %
Hematocrit: 36.8 % (ref 34.0–46.6)
Hemoglobin: 11.9 g/dL (ref 11.1–15.9)
Immature Grans (Abs): 0 10*3/uL (ref 0.0–0.1)
Immature Granulocytes: 0 %
Lymphocytes Absolute: 3.5 10*3/uL — ABNORMAL HIGH (ref 0.7–3.1)
Lymphs: 39 %
MCH: 25.2 pg — ABNORMAL LOW (ref 26.6–33.0)
MCHC: 32.3 g/dL (ref 31.5–35.7)
MCV: 78 fL — ABNORMAL LOW (ref 79–97)
Monocytes Absolute: 0.6 10*3/uL (ref 0.1–0.9)
Monocytes: 7 %
Neutrophils Absolute: 4.4 10*3/uL (ref 1.4–7.0)
Neutrophils: 49 %
Platelets: 305 10*3/uL (ref 150–450)
RBC: 4.73 x10E6/uL (ref 3.77–5.28)
RDW: 16 % — ABNORMAL HIGH (ref 11.7–15.4)
WBC: 9.1 10*3/uL (ref 3.4–10.8)

## 2022-06-20 LAB — EHRLICHIA ANTIBODY PANEL
E. Chaffeensis (HME) IgM Titer: NEGATIVE
E.Chaffeensis (HME) IgG: NEGATIVE
HGE IgG Titer: NEGATIVE
HGE IgM Titer: NEGATIVE

## 2022-06-20 LAB — ANA+ENA+DNA/DS+ANTICH+CENTR
ANA Titer 1: NEGATIVE
Anti JO-1: <0.2 AI (ref 0.0–0.9)
Centromere Ab Screen: <0.2 AI (ref 0.0–0.9)
Chromatin Ab SerPl-aCnc: <0.2 AI (ref 0.0–0.9)
ENA RNP Ab: <0.2 AI (ref 0.0–0.9)
ENA SM Ab Ser-aCnc: <0.2 AI (ref 0.0–0.9)
ENA SSA (RO) Ab: <0.2 AI (ref 0.0–0.9)
ENA SSB (LA) Ab: <0.2 AI (ref 0.0–0.9)
Scleroderma (Scl-70) (ENA) Antibody, IgG: <0.2 AI (ref 0.0–0.9)
dsDNA Ab: <1 IU/mL (ref 0–9)

## 2022-06-20 LAB — RA QN+CCP(IGG/A)+SJOSSA+SJOSSB
Cyclic Citrullin Peptide Ab: 6 units (ref 0–19)
Rheumatoid fact SerPl-aCnc: <10 IU/mL (ref <14.0)

## 2022-06-20 LAB — THYROID PEROXIDASE ANTIBODY: Thyroperoxidase Ab SerPl-aCnc: <9 IU/mL (ref 0–34)

## 2022-06-20 LAB — CK: Total CK: 127 U/L (ref 32–182)

## 2022-06-20 LAB — THYROID PANEL WITH TSH
Free Thyroxine Index: 2.7 (ref 1.2–4.9)
T3 Uptake Ratio: 28 % (ref 24–39)
T4, Total: 9.5 ug/dL (ref 4.5–12.0)
TSH: 3.48 u[IU]/mL (ref 0.450–4.500)

## 2022-06-20 LAB — SPOTTED FEVER GROUP ANTIBODIES
Spotted Fever Group IgG: <1:64 {titer}
Spotted Fever Group IgM: <1:64 {titer}

## 2022-06-20 LAB — URIC ACID: Uric Acid: 4.6 mg/dL (ref 2.6–6.2)

## 2022-06-21 ENCOUNTER — Encounter: Payer: Self-pay | Admitting: Family Medicine

## 2022-06-21 ENCOUNTER — Other Ambulatory Visit: Payer: Self-pay | Admitting: Family Medicine

## 2022-06-21 DIAGNOSIS — R7301 Impaired fasting glucose: Secondary | ICD-10-CM | POA: Insufficient documentation

## 2022-06-21 DIAGNOSIS — E559 Vitamin D deficiency, unspecified: Secondary | ICD-10-CM | POA: Insufficient documentation

## 2022-06-21 MED ORDER — VITAMIN D (ERGOCALCIFEROL) 1.25 MG (50000 UNIT) PO CAPS: 50000.0000 [IU] | ORAL_CAPSULE | ORAL | 0 refills | Status: DC

## 2022-06-23 NOTE — Assessment & Plan Note (Signed)
Has not been taking her vasartan. Will restart it. Recheck 2 weeks with PCP.

## 2022-06-23 NOTE — Assessment & Plan Note (Signed)
Not under good control. Will change her lexapro to cymbalta to '60mg'$ . Referral to psychiatry placed today. Call with any concerns.

## 2022-06-30 ENCOUNTER — Telehealth: Payer: Self-pay | Admitting: Nurse Practitioner

## 2022-06-30 NOTE — Telephone Encounter (Signed)
Pt. Given lab results and instructions.Verbalizes understanding. 

## 2022-07-02 ENCOUNTER — Ambulatory Visit: Payer: Medicaid Other | Admitting: Nurse Practitioner

## 2022-07-02 NOTE — Progress Notes (Deleted)
There were no vitals taken for this visit.   Subjective:    Patient ID: Melissa Mendez, female    DOB: 12-05-1990, 32 y.o.   MRN: [id number]  HPI: Melissa Mendez is a 32 y.o. female  No chief complaint on file.  DEPRESSION Mood status: uncontrolled Satisfied with current treatment?: no Symptom severity: moderate  Duration of current treatment : months Side effects: yes- leg cramps Medication compliance: good compliance Psychotherapy/counseling: no Previous psychiatric medications: cymbalta, lexapro Depressed mood: yes Anxious mood: yes Anhedonia: no Significant weight loss or gain: no Insomnia: no  Fatigue: yes Feelings of worthlessness or guilt: yes Impaired concentration/indecisiveness: yes Suicidal ideations: no Hopelessness: no Crying spells: yes    06/18/2022   10:26 AM 02/18/2022   10:24 AM 09/01/2021    3:22 PM 05/05/2021    4:18 PM 12/19/2020    9:10 AM  Depression screen PHQ 2/9  Decreased Interest 3 1 0 0 1  Down, Depressed, Hopeless 2 1 0 0 0  PHQ - 2 Score 5 2 0 0 1  Altered sleeping '2 1 2 3 1  '$ Tired, decreased energy '3 1 1 '$ 0 1  Change in appetite 0 1 0 0 0  Feeling bad or failure about yourself  3 1 0 1 0  Trouble concentrating 3 1 0 1 0  Moving slowly or fidgety/restless 0 0 0 0 0  Suicidal thoughts 1 0 0 0 0  PHQ-9 Score '17 7 3 5 3  '$ Difficult doing work/chores Very difficult Somewhat difficult Not difficult at all Not difficult at all Not difficult at all   HYPERTENSION- stopped taking her blood pressure medicine.   Hypertension status: uncontrolled  Satisfied with current treatment? no Duration of hypertension: chronic BP monitoring frequency:  not checking BP medication side effects:  no Medication compliance: poor compliance Previous BP meds:valsartan Aspirin: no Recurrent headaches: yes Visual changes: no Palpitations: no Dyspnea: no Chest pain: no Lower extremity edema: no Dizzy/lightheaded: no   Relevant past medical, surgical,  family and social history reviewed and updated as indicated. Interim medical history since our last visit reviewed. Allergies and medications reviewed and updated.  Review of Systems  Constitutional: Negative.   Respiratory: Negative.    Cardiovascular: Negative.   Gastrointestinal:  Positive for nausea and vomiting. Negative for abdominal distention, abdominal pain, anal bleeding, blood in stool, constipation, diarrhea and rectal pain.  Musculoskeletal:  Positive for myalgias. Negative for arthralgias, back pain, gait problem, joint swelling, neck pain and neck stiffness.  Skin: Negative.   Psychiatric/Behavioral:  Positive for dysphoric mood. Negative for agitation, behavioral problems, confusion, decreased concentration, hallucinations, self-injury, sleep disturbance and suicidal ideas. The patient is nervous/anxious. The patient is not hyperactive.     Per HPI unless specifically indicated above     Objective:    There were no vitals taken for this visit.  Wt Readings from Last 3 Encounters:  06/18/22 (!) 311 lb 9.6 oz (141.3 kg)  02/18/22 (!) 314 lb 12.8 oz (142.8 kg)  12/23/21 (!) 323 lb 12.8 oz (146.9 kg)    Physical Exam Vitals and nursing note reviewed.  Constitutional:      General: She is not in acute distress.    Appearance: Normal appearance. She is obese. She is not ill-appearing, toxic-appearing or diaphoretic.  HENT:     Head: Normocephalic and atraumatic.     Right Ear: External ear normal.     Left Ear: External ear normal.     Nose: Nose  normal.     Mouth/Throat:     Mouth: Mucous membranes are moist.     Pharynx: Oropharynx is clear.  Eyes:     General: No scleral icterus.       Right eye: No discharge.        Left eye: No discharge.     Extraocular Movements: Extraocular movements intact.     Conjunctiva/sclera: Conjunctivae normal.     Pupils: Pupils are equal, round, and reactive to light.  Cardiovascular:     Rate and Rhythm: Normal rate and  regular rhythm.     Pulses: Normal pulses.     Heart sounds: Normal heart sounds. No murmur heard.    No friction rub. No gallop.  Pulmonary:     Effort: Pulmonary effort is normal. No respiratory distress.     Breath sounds: Normal breath sounds. No stridor. No wheezing, rhonchi or rales.  Chest:     Chest wall: No tenderness.  Musculoskeletal:        General: Normal range of motion.     Cervical back: Normal range of motion and neck supple.  Skin:    General: Skin is warm and dry.     Capillary Refill: Capillary refill takes less than 2 seconds.     Coloration: Skin is not jaundiced or pale.     Findings: No bruising, erythema, lesion or rash.  Neurological:     General: No focal deficit present.     Mental Status: She is alert and oriented to person, place, and time. Mental status is at baseline.  Psychiatric:        Mood and Affect: Mood normal.        Behavior: Behavior normal.        Thought Content: Thought content normal.        Judgment: Judgment normal.    Results for orders placed or performed in visit on 06/18/22  Thyroid Panel With TSH  Result Value Ref Range   TSH 3.480 0.450 - 4.500 uIU/mL   T4, Total 9.5 4.5 - 12.0 ug/dL   T3 Uptake Ratio 28 24 - 39 %   Free Thyroxine Index 2.7 1.2 - 4.9  Thyroid Peroxidase Antibody  Result Value Ref Range   Thyroperoxidase Ab SerPl-aCnc <9 0 - 34 IU/mL  RA Qn+CCP(IgG/A)+SjoSSA+SjoSSB  Result Value Ref Range   Rhuematoid fact SerPl-aCnc <10.0 XX123456 IU/mL   Cyclic Citrullin Peptide Ab 6 0 - 19 units  Comprehensive metabolic panel  Result Value Ref Range   Glucose 101 (H) 70 - 99 mg/dL   BUN 16 6 - 20 mg/dL   Creatinine, Ser 1.02 (H) 0.57 - 1.00 mg/dL   eGFR 74 >59 mL/min/1.73   BUN/Creatinine Ratio 16 9 - 23   Sodium 137 134 - 144 mmol/L   Potassium 4.4 3.5 - 5.2 mmol/L   Chloride 100 96 - 106 mmol/L   CO2 20 20 - 29 mmol/L   Calcium 9.5 8.7 - 10.2 mg/dL   Total Protein 7.4 6.0 - 8.5 g/dL   Albumin 4.7 3.9 - 4.9  g/dL   Globulin, Total 2.7 1.5 - 4.5 g/dL   Albumin/Globulin Ratio 1.7 1.2 - 2.2   Bilirubin Total <0.2 0.0 - 1.2 mg/dL   Alkaline Phosphatase 81 44 - 121 IU/L   AST 24 0 - 40 IU/L   ALT 27 0 - 32 IU/L  CBC with Differential/Platelet  Result Value Ref Range   WBC 9.1 3.4 - 10.8 x10E3/uL   RBC  4.73 3.77 - 5.28 x10E6/uL   Hemoglobin 11.9 11.1 - 15.9 g/dL   Hematocrit 36.8 34.0 - 46.6 %   MCV 78 (L) 79 - 97 fL   MCH 25.2 (L) 26.6 - 33.0 pg   MCHC 32.3 31.5 - 35.7 g/dL   RDW 16.0 (H) 11.7 - 15.4 %   Platelets 305 150 - 450 x10E3/uL   Neutrophils 49 Not Estab. %   Lymphs 39 Not Estab. %   Monocytes 7 Not Estab. %   Eos 4 Not Estab. %   Basos 1 Not Estab. %   Neutrophils Absolute 4.4 1.4 - 7.0 x10E3/uL   Lymphocytes Absolute 3.5 (H) 0.7 - 3.1 x10E3/uL   Monocytes Absolute 0.6 0.1 - 0.9 x10E3/uL   EOS (ABSOLUTE) 0.4 0.0 - 0.4 x10E3/uL   Basophils Absolute 0.1 0.0 - 0.2 x10E3/uL   Immature Granulocytes 0 Not Estab. %   Immature Grans (Abs) 0.0 0.0 - 0.1 x10E3/uL  Bayer DCA Hb A1c Waived  Result Value Ref Range   HB A1C (BAYER DCA - WAIVED) 5.7 (H) 4.8 - 5.6 %  Lipid Panel w/o Chol/HDL Ratio  Result Value Ref Range   Cholesterol, Total 187 100 - 199 mg/dL   Triglycerides 140 0 - 149 mg/dL   HDL 40 >39 mg/dL   VLDL Cholesterol Cal 25 5 - 40 mg/dL   LDL Chol Calc (NIH) 122 (H) 0 - 99 mg/dL  VITAMIN D 25 Hydroxy (Vit-D Deficiency, Fractures)  Result Value Ref Range   Vit D, 25-Hydroxy 17.1 (L) 30.0 - 100.0 ng/mL  ANA+ENA+DNA/DS+Antich+Centr  Result Value Ref Range   ANA Titer 1 Negative    dsDNA Ab <1 0 - 9 IU/mL   ENA RNP Ab <0.2 0.0 - 0.9 AI   ENA SM Ab Ser-aCnc <0.2 0.0 - 0.9 AI   Scleroderma (Scl-70) (ENA) Antibody, IgG <0.2 0.0 - 0.9 AI   ENA SSA (RO) Ab <0.2 0.0 - 0.9 AI   ENA SSB (LA) Ab <0.2 0.0 - 0.9 AI   Chromatin Ab SerPl-aCnc <0.2 0.0 - 0.9 AI   Anti JO-1 <0.2 0.0 - 0.9 AI   Centromere Ab Screen <0.2 0.0 - 0.9 AI   See below: Comment   CK  Result Value Ref  Range   Total CK 127 32 - 182 U/L  Uric acid  Result Value Ref Range   Uric Acid 4.6 2.6 - 6.2 mg/dL  Ehrlichia antibody panel  Result Value Ref Range   E.Chaffeensis (HME) IgG Negative Neg:<1:64   E. Chaffeensis (HME) IgM Titer Negative Neg:<1:20   HGE IgG Titer Negative Neg:<1:64   HGE IgM Titer Negative Neg:<1:20   Result Comment: Comment   Spotted Fever Group Antibodies  Result Value Ref Range   Spotted Fever Group IgG <1:64 Neg:<1:64   Spotted Fever Group IgM <1:64 Neg:<1:64   Result Comment Comment       Assessment & Plan:   Problem List Items Addressed This Visit   None    Follow up plan: No follow-ups on file.

## 2022-07-20 ENCOUNTER — Other Ambulatory Visit: Payer: Self-pay

## 2022-07-20 MED ORDER — PANTOPRAZOLE SODIUM 40 MG PO TBEC: 40.0000 mg | DELAYED_RELEASE_TABLET | Freq: Two times a day (BID) | ORAL | 1 refills | Status: DC

## 2022-07-22 ENCOUNTER — Other Ambulatory Visit: Payer: Self-pay | Admitting: Nurse Practitioner

## 2022-07-22 DIAGNOSIS — M797 Fibromyalgia: Secondary | ICD-10-CM

## 2022-07-22 DIAGNOSIS — F32A Depression, unspecified: Secondary | ICD-10-CM

## 2022-07-23 NOTE — Telephone Encounter (Signed)
Requested Prescriptions  Pending Prescriptions Disp Refills   gabapentin (NEURONTIN) 800 MG tablet [Pharmacy Med Name: GABAPENTIN 800MG  TABLETS] 270 tablet 0    Sig: TAKE 1 TABLET BY MOUTH THREE TIMES DAILY     Neurology: Anticonvulsants - gabapentin Failed - 07/22/2022  2:56 PM      Failed - Cr in normal range and within 360 days    Creatinine, Ser  Date Value Ref Range Status  06/18/2022 1.02 (H) 0.57 - 1.00 mg/dL Final         Passed - Completed PHQ-2 or PHQ-9 in the last 360 days      Passed - Valid encounter within last 12 months    Recent Outpatient Visits           1 month ago Anxiety and depression   [place], [place] P, DO   5 months ago Annual physical exam   [place], NP   5 months ago Acute maxillary sinusitis, recurrence not specified   [place], PA-C   10 months ago Chronic pain of right knee   [place], NP   11 months ago Muscle cramping   [place] Mecum, Dani Gobble, PA-C

## 2022-07-29 ENCOUNTER — Encounter: Payer: Self-pay | Admitting: Nurse Practitioner

## 2022-07-29 ENCOUNTER — Ambulatory Visit: Payer: Medicaid Other | Admitting: Nurse Practitioner

## 2022-07-29 NOTE — Progress Notes (Deleted)
There were no vitals taken for this visit.   Subjective:    Patient ID: Melissa Mendez, female    DOB: 12-05-1990, 32 y.o.   MRN: [id number]  HPI: Melissa Mendez is a 32 y.o. female  No chief complaint on file.  DEPRESSION Mood status: uncontrolled Satisfied with current treatment?: no Symptom severity: moderate  Duration of current treatment : months Side effects: yes- leg cramps Medication compliance: good compliance Psychotherapy/counseling: no Previous psychiatric medications: cymbalta, lexapro Depressed mood: yes Anxious mood: yes Anhedonia: no Significant weight loss or gain: no Insomnia: no  Fatigue: yes Feelings of worthlessness or guilt: yes Impaired concentration/indecisiveness: yes Suicidal ideations: no Hopelessness: no Crying spells: yes    06/18/2022   10:26 AM 02/18/2022   10:24 AM 09/01/2021    3:22 PM 05/05/2021    4:18 PM 12/19/2020    9:10 AM  Depression screen PHQ 2/9  Decreased Interest 3 1 0 0 1  Down, Depressed, Hopeless 2 1 0 0 0  PHQ - 2 Score 5 2 0 0 1  Altered sleeping '2 1 2 3 1  '$ Tired, decreased energy '3 1 1 '$ 0 1  Change in appetite 0 1 0 0 0  Feeling bad or failure about yourself  3 1 0 1 0  Trouble concentrating 3 1 0 1 0  Moving slowly or fidgety/restless 0 0 0 0 0  Suicidal thoughts 1 0 0 0 0  PHQ-9 Score '17 7 3 5 3  '$ Difficult doing work/chores Very difficult Somewhat difficult Not difficult at all Not difficult at all Not difficult at all   HYPERTENSION- stopped taking her blood pressure medicine.   Hypertension status: uncontrolled  Satisfied with current treatment? no Duration of hypertension: chronic BP monitoring frequency:  not checking BP medication side effects:  no Medication compliance: poor compliance Previous BP meds:valsartan Aspirin: no Recurrent headaches: yes Visual changes: no Palpitations: no Dyspnea: no Chest pain: no Lower extremity edema: no Dizzy/lightheaded: no   Relevant past medical, surgical,  family and social history reviewed and updated as indicated. Interim medical history since our last visit reviewed. Allergies and medications reviewed and updated.  Review of Systems  Constitutional: Negative.   Respiratory: Negative.    Cardiovascular: Negative.   Gastrointestinal:  Positive for nausea and vomiting. Negative for abdominal distention, abdominal pain, anal bleeding, blood in stool, constipation, diarrhea and rectal pain.  Musculoskeletal:  Positive for myalgias. Negative for arthralgias, back pain, gait problem, joint swelling, neck pain and neck stiffness.  Skin: Negative.   Psychiatric/Behavioral:  Positive for dysphoric mood. Negative for agitation, behavioral problems, confusion, decreased concentration, hallucinations, self-injury, sleep disturbance and suicidal ideas. The patient is nervous/anxious. The patient is not hyperactive.     Per HPI unless specifically indicated above     Objective:    There were no vitals taken for this visit.  Wt Readings from Last 3 Encounters:  06/18/22 (!) 311 lb 9.6 oz (141.3 kg)  02/18/22 (!) 314 lb 12.8 oz (142.8 kg)  12/23/21 (!) 323 lb 12.8 oz (146.9 kg)    Physical Exam Vitals and nursing note reviewed.  Constitutional:      General: She is not in acute distress.    Appearance: Normal appearance. She is obese. She is not ill-appearing, toxic-appearing or diaphoretic.  HENT:     Head: Normocephalic and atraumatic.     Right Ear: External ear normal.     Left Ear: External ear normal.     Nose: Nose  normal.     Mouth/Throat:     Mouth: Mucous membranes are moist.     Pharynx: Oropharynx is clear.  Eyes:     General: No scleral icterus.       Right eye: No discharge.        Left eye: No discharge.     Extraocular Movements: Extraocular movements intact.     Conjunctiva/sclera: Conjunctivae normal.     Pupils: Pupils are equal, round, and reactive to light.  Cardiovascular:     Rate and Rhythm: Normal rate and  regular rhythm.     Pulses: Normal pulses.     Heart sounds: Normal heart sounds. No murmur heard.    No friction rub. No gallop.  Pulmonary:     Effort: Pulmonary effort is normal. No respiratory distress.     Breath sounds: Normal breath sounds. No stridor. No wheezing, rhonchi or rales.  Chest:     Chest wall: No tenderness.  Musculoskeletal:        General: Normal range of motion.     Cervical back: Normal range of motion and neck supple.  Skin:    General: Skin is warm and dry.     Capillary Refill: Capillary refill takes less than 2 seconds.     Coloration: Skin is not jaundiced or pale.     Findings: No bruising, erythema, lesion or rash.  Neurological:     General: No focal deficit present.     Mental Status: She is alert and oriented to person, place, and time. Mental status is at baseline.  Psychiatric:        Mood and Affect: Mood normal.        Behavior: Behavior normal.        Thought Content: Thought content normal.        Judgment: Judgment normal.    Results for orders placed or performed in visit on 06/18/22  Thyroid Panel With TSH  Result Value Ref Range   TSH 3.480 0.450 - 4.500 uIU/mL   T4, Total 9.5 4.5 - 12.0 ug/dL   T3 Uptake Ratio 28 24 - 39 %   Free Thyroxine Index 2.7 1.2 - 4.9  Thyroid Peroxidase Antibody  Result Value Ref Range   Thyroperoxidase Ab SerPl-aCnc <9 0 - 34 IU/mL  RA Qn+CCP(IgG/A)+SjoSSA+SjoSSB  Result Value Ref Range   Rheumatoid fact SerPl-aCnc <10.0 <14.0 IU/mL   Cyclic Citrullin Peptide Ab 6 0 - 19 units  Comprehensive metabolic panel  Result Value Ref Range   Glucose 101 (H) 70 - 99 mg/dL   BUN 16 6 - 20 mg/dL   Creatinine, Ser 3.83 (H) 0.57 - 1.00 mg/dL   eGFR 74 >81 MM/CRF/5.43   BUN/Creatinine Ratio 16 9 - 23   Sodium 137 134 - 144 mmol/L   Potassium 4.4 3.5 - 5.2 mmol/L   Chloride 100 96 - 106 mmol/L   CO2 20 20 - 29 mmol/L   Calcium 9.5 8.7 - 10.2 mg/dL   Total Protein 7.4 6.0 - 8.5 g/dL   Albumin 4.7 3.9 - 4.9  g/dL   Globulin, Total 2.7 1.5 - 4.5 g/dL   Albumin/Globulin Ratio 1.7 1.2 - 2.2   Bilirubin Total <0.2 0.0 - 1.2 mg/dL   Alkaline Phosphatase 81 44 - 121 IU/L   AST 24 0 - 40 IU/L   ALT 27 0 - 32 IU/L  CBC with Differential/Platelet  Result Value Ref Range   WBC 9.1 3.4 - 10.8 x10E3/uL   RBC  4.73 3.77 - 5.28 x10E6/uL   Hemoglobin 11.9 11.1 - 15.9 g/dL   Hematocrit 88.2 80.0 - 46.6 %   MCV 78 (L) 79 - 97 fL   MCH 25.2 (L) 26.6 - 33.0 pg   MCHC 32.3 31.5 - 35.7 g/dL   RDW 34.9 (H) 17.9 - 15.0 %   Platelets 305 150 - 450 x10E3/uL   Neutrophils 49 Not Estab. %   Lymphs 39 Not Estab. %   Monocytes 7 Not Estab. %   Eos 4 Not Estab. %   Basos 1 Not Estab. %   Neutrophils Absolute 4.4 1.4 - 7.0 x10E3/uL   Lymphocytes Absolute 3.5 (H) 0.7 - 3.1 x10E3/uL   Monocytes Absolute 0.6 0.1 - 0.9 x10E3/uL   EOS (ABSOLUTE) 0.4 0.0 - 0.4 x10E3/uL   Basophils Absolute 0.1 0.0 - 0.2 x10E3/uL   Immature Granulocytes 0 Not Estab. %   Immature Grans (Abs) 0.0 0.0 - 0.1 x10E3/uL  Bayer DCA Hb A1c Waived  Result Value Ref Range   HB A1C (BAYER DCA - WAIVED) 5.7 (H) 4.8 - 5.6 %  Lipid Panel w/o Chol/HDL Ratio  Result Value Ref Range   Cholesterol, Total 187 100 - 199 mg/dL   Triglycerides 569 0 - 149 mg/dL   HDL 40 >79 mg/dL   VLDL Cholesterol Cal 25 5 - 40 mg/dL   LDL Chol Calc (NIH) 480 (H) 0 - 99 mg/dL  VITAMIN D 25 Hydroxy (Vit-D Deficiency, Fractures)  Result Value Ref Range   Vit D, 25-Hydroxy 17.1 (L) 30.0 - 100.0 ng/mL  ANA+ENA+DNA/DS+Antich+Centr  Result Value Ref Range   ANA Titer 1 Negative    dsDNA Ab <1 0 - 9 IU/mL   ENA RNP Ab <0.2 0.0 - 0.9 AI   ENA SM Ab Ser-aCnc <0.2 0.0 - 0.9 AI   Scleroderma (Scl-70) (ENA) Antibody, IgG <0.2 0.0 - 0.9 AI   ENA SSA (RO) Ab <0.2 0.0 - 0.9 AI   ENA SSB (LA) Ab <0.2 0.0 - 0.9 AI   Chromatin Ab SerPl-aCnc <0.2 0.0 - 0.9 AI   Anti JO-1 <0.2 0.0 - 0.9 AI   Centromere Ab Screen <0.2 0.0 - 0.9 AI   See below: Comment   CK  Result Value Ref  Range   Total CK 127 32 - 182 U/L  Uric acid  Result Value Ref Range   Uric Acid 4.6 2.6 - 6.2 mg/dL  Ehrlichia antibody panel  Result Value Ref Range   E.Chaffeensis (HME) IgG Negative Neg:<1:64   E. Chaffeensis (HME) IgM Titer Negative Neg:<1:20   HGE IgG Titer Negative Neg:<1:64   HGE IgM Titer Negative Neg:<1:20   Result Comment: Comment   Spotted Fever Group Antibodies  Result Value Ref Range   Spotted Fever Group IgG <1:64 Neg:<1:64   Spotted Fever Group IgM <1:64 Neg:<1:64   Result Comment Comment       Assessment & Plan:   Problem List Items Addressed This Visit      Cardiovascular and Mediastinum   Hypertension - Primary     Endocrine   IFG (impaired fasting glucose)     Other   Anxiety and depression   Vitamin D deficiency     Follow up plan: No follow-ups on file.

## 2022-08-15 ENCOUNTER — Other Ambulatory Visit: Payer: Self-pay | Admitting: Nurse Practitioner

## 2022-08-17 NOTE — Telephone Encounter (Signed)
Requested medication (s) are due for refill today: yes  Requested medication (s) are on the active medication list: yes  Last refill:  02/18/22 # 60  Future visit scheduled: yes  Notes to clinic:  called pt and made appt. -   Requested Prescriptions  Pending Prescriptions Disp Refills   valsartan (DIOVAN) 80 MG tablet [Pharmacy Med Name: VALSARTAN 80MG  TABLETS] 60 tablet 0    Sig: TAKE 1 TABLET(80 MG) BY MOUTH DAILY     Cardiovascular:  Angiotensin Receptor Blockers Failed - 08/15/2022  3:07 PM      Failed - Cr in normal range and within 180 days    Creatinine, Ser  Date Value Ref Range Status  06/18/2022 1.02 (H) 0.57 - 1.00 mg/dL Final         Failed - Last BP in normal range    BP Readings from Last 1 Encounters:  06/18/22 (!) 155/94         Passed - K in normal range and within 180 days    Potassium  Date Value Ref Range Status  06/18/2022 4.4 3.5 - 5.2 mmol/L Final         Passed - Patient is not pregnant      Passed - Valid encounter within last 6 months    Recent Outpatient Visits           2 months ago Anxiety and depression   [place] [facility] [place], Megan P, DO   6 months ago Annual physical exam   [place] [facility] Larae Grooms, NP   6 months ago Acute maxillary sinusitis, recurrence not specified   [place] [facility] M[place], Oswaldo [place], PA-C   11 months ago Chronic pain of right knee    [place] [facility] Larae Grooms, NP   1 year ago Muscle cramping   [place] [name] Mecum, Oswaldo Conroy, PA-C       Future Appointments             In 3 days Larae Grooms, NP  [place] [facility]

## 2022-08-20 ENCOUNTER — Ambulatory Visit: Payer: Medicaid Other | Admitting: Nurse Practitioner

## 2022-08-20 NOTE — Progress Notes (Deleted)
There were no vitals taken for this visit.   Subjective:    Patient ID: Melissa Mendez, female    DOB: 12-05-1990, 32 y.o.   MRN: [id number]  HPI: Melissa Mendez is a 32 y.o. female  No chief complaint on file.  DEPRESSION Mood status: uncontrolled Satisfied with current treatment?: no Symptom severity: moderate  Duration of current treatment : months Side effects: yes- leg cramps Medication compliance: good compliance Psychotherapy/counseling: no Previous psychiatric medications: cymbalta, lexapro Depressed mood: yes Anxious mood: yes Anhedonia: no Significant weight loss or gain: no Insomnia: no  Fatigue: yes Feelings of worthlessness or guilt: yes Impaired concentration/indecisiveness: yes Suicidal ideations: no Hopelessness: no Crying spells: yes    06/18/2022   10:26 AM 02/18/2022   10:24 AM 09/01/2021    3:22 PM 05/05/2021    4:18 PM 12/19/2020    9:10 AM  Depression screen PHQ 2/9  Decreased Interest 3 1 0 0 1  Down, Depressed, Hopeless 2 1 0 0 0  PHQ - 2 Score 5 2 0 0 1  Altered sleeping '2 1 2 3 1  '$ Tired, decreased energy '3 1 1 '$ 0 1  Change in appetite 0 1 0 0 0  Feeling bad or failure about yourself  3 1 0 1 0  Trouble concentrating 3 1 0 1 0  Moving slowly or fidgety/restless 0 0 0 0 0  Suicidal thoughts 1 0 0 0 0  PHQ-9 Score '17 7 3 5 3  '$ Difficult doing work/chores Very difficult Somewhat difficult Not difficult at all Not difficult at all Not difficult at all   HYPERTENSION- stopped taking her blood pressure medicine.   Hypertension status: uncontrolled  Satisfied with current treatment? no Duration of hypertension: chronic BP monitoring frequency:  not checking BP medication side effects:  no Medication compliance: poor compliance Previous BP meds:valsartan Aspirin: no Recurrent headaches: yes Visual changes: no Palpitations: no Dyspnea: no Chest pain: no Lower extremity edema: no Dizzy/lightheaded: no   Relevant past medical, surgical,  family and social history reviewed and updated as indicated. Interim medical history since our last visit reviewed. Allergies and medications reviewed and updated.  Review of Systems  Constitutional: Negative.   Respiratory: Negative.    Cardiovascular: Negative.   Gastrointestinal:  Positive for nausea and vomiting. Negative for abdominal distention, abdominal pain, anal bleeding, blood in stool, constipation, diarrhea and rectal pain.  Musculoskeletal:  Positive for myalgias. Negative for arthralgias, back pain, gait problem, joint swelling, neck pain and neck stiffness.  Skin: Negative.   Psychiatric/Behavioral:  Positive for dysphoric mood. Negative for agitation, behavioral problems, confusion, decreased concentration, hallucinations, self-injury, sleep disturbance and suicidal ideas. The patient is nervous/anxious. The patient is not hyperactive.     Per HPI unless specifically indicated above     Objective:    There were no vitals taken for this visit.  Wt Readings from Last 3 Encounters:  06/18/22 (!) 311 lb 9.6 oz (141.3 kg)  02/18/22 (!) 314 lb 12.8 oz (142.8 kg)  12/23/21 (!) 323 lb 12.8 oz (146.9 kg)    Physical Exam Vitals and nursing note reviewed.  Constitutional:      General: She is not in acute distress.    Appearance: Normal appearance. She is obese. She is not ill-appearing, toxic-appearing or diaphoretic.  HENT:     Head: Normocephalic and atraumatic.     Right Ear: External ear normal.     Left Ear: External ear normal.     Nose: Nose  normal.     Mouth/Throat:     Mouth: Mucous membranes are moist.     Pharynx: Oropharynx is clear.  Eyes:     General: No scleral icterus.       Right eye: No discharge.        Left eye: No discharge.     Extraocular Movements: Extraocular movements intact.     Conjunctiva/sclera: Conjunctivae normal.     Pupils: Pupils are equal, round, and reactive to light.  Cardiovascular:     Rate and Rhythm: Normal rate and  regular rhythm.     Pulses: Normal pulses.     Heart sounds: Normal heart sounds. No murmur heard.    No friction rub. No gallop.  Pulmonary:     Effort: Pulmonary effort is normal. No respiratory distress.     Breath sounds: Normal breath sounds. No stridor. No wheezing, rhonchi or rales.  Chest:     Chest wall: No tenderness.  Musculoskeletal:        General: Normal range of motion.     Cervical back: Normal range of motion and neck supple.  Skin:    General: Skin is warm and dry.     Capillary Refill: Capillary refill takes less than 2 seconds.     Coloration: Skin is not jaundiced or pale.     Findings: No bruising, erythema, lesion or rash.  Neurological:     General: No focal deficit present.     Mental Status: She is alert and oriented to person, place, and time. Mental status is at baseline.  Psychiatric:        Mood and Affect: Mood normal.        Behavior: Behavior normal.        Thought Content: Thought content normal.        Judgment: Judgment normal.    Results for orders placed or performed in visit on 06/18/22  Thyroid Panel With TSH  Result Value Ref Range   TSH 3.480 0.450 - 4.500 uIU/mL   T4, Total 9.5 4.5 - 12.0 ug/dL   T3 Uptake Ratio 28 24 - 39 %   Free Thyroxine Index 2.7 1.2 - 4.9  Thyroid Peroxidase Antibody  Result Value Ref Range   Thyroperoxidase Ab SerPl-aCnc <9 0 - 34 IU/mL  RA Qn+CCP(IgG/A)+SjoSSA+SjoSSB  Result Value Ref Range   Rheumatoid fact SerPl-aCnc <10.0 <14.0 IU/mL   Cyclic Citrullin Peptide Ab 6 0 - 19 units  Comprehensive metabolic panel  Result Value Ref Range   Glucose 101 (H) 70 - 99 mg/dL   BUN 16 6 - 20 mg/dL   Creatinine, Ser 3.83 (H) 0.57 - 1.00 mg/dL   eGFR 74 >81 MM/CRF/5.43   BUN/Creatinine Ratio 16 9 - 23   Sodium 137 134 - 144 mmol/L   Potassium 4.4 3.5 - 5.2 mmol/L   Chloride 100 96 - 106 mmol/L   CO2 20 20 - 29 mmol/L   Calcium 9.5 8.7 - 10.2 mg/dL   Total Protein 7.4 6.0 - 8.5 g/dL   Albumin 4.7 3.9 - 4.9  g/dL   Globulin, Total 2.7 1.5 - 4.5 g/dL   Albumin/Globulin Ratio 1.7 1.2 - 2.2   Bilirubin Total <0.2 0.0 - 1.2 mg/dL   Alkaline Phosphatase 81 44 - 121 IU/L   AST 24 0 - 40 IU/L   ALT 27 0 - 32 IU/L  CBC with Differential/Platelet  Result Value Ref Range   WBC 9.1 3.4 - 10.8 x10E3/uL   RBC  4.73 3.77 - 5.28 x10E6/uL   Hemoglobin 11.9 11.1 - 15.9 g/dL   Hematocrit 36.8 34.0 - 46.6 %   MCV 78 (L) 79 - 97 fL   MCH 25.2 (L) 26.6 - 33.0 pg   MCHC 32.3 31.5 - 35.7 g/dL   RDW 16.0 (H) 11.7 - 15.4 %   Platelets 305 150 - 450 x10E3/uL   Neutrophils 49 Not Estab. %   Lymphs 39 Not Estab. %   Monocytes 7 Not Estab. %   Eos 4 Not Estab. %   Basos 1 Not Estab. %   Neutrophils Absolute 4.4 1.4 - 7.0 x10E3/uL   Lymphocytes Absolute 3.5 (H) 0.7 - 3.1 x10E3/uL   Monocytes Absolute 0.6 0.1 - 0.9 x10E3/uL   EOS (ABSOLUTE) 0.4 0.0 - 0.4 x10E3/uL   Basophils Absolute 0.1 0.0 - 0.2 x10E3/uL   Immature Granulocytes 0 Not Estab. %   Immature Grans (Abs) 0.0 0.0 - 0.1 x10E3/uL  Bayer DCA Hb A1c Waived  Result Value Ref Range   HB A1C (BAYER DCA - WAIVED) 5.7 (H) 4.8 - 5.6 %  Lipid Panel w/o Chol/HDL Ratio  Result Value Ref Range   Cholesterol, Total 187 100 - 199 mg/dL   Triglycerides 140 0 - 149 mg/dL   HDL 40 >39 mg/dL   VLDL Cholesterol Cal 25 5 - 40 mg/dL   LDL Chol Calc (NIH) 122 (H) 0 - 99 mg/dL  VITAMIN D 25 Hydroxy (Vit-D Deficiency, Fractures)  Result Value Ref Range   Vit D, 25-Hydroxy 17.1 (L) 30.0 - 100.0 ng/mL  ANA+ENA+DNA/DS+Antich+Centr  Result Value Ref Range   ANA Titer 1 Negative    dsDNA Ab <1 0 - 9 IU/mL   ENA RNP Ab <0.2 0.0 - 0.9 AI   ENA SM Ab Ser-aCnc <0.2 0.0 - 0.9 AI   Scleroderma (Scl-70) (ENA) Antibody, IgG <0.2 0.0 - 0.9 AI   ENA SSA (RO) Ab <0.2 0.0 - 0.9 AI   ENA SSB (LA) Ab <0.2 0.0 - 0.9 AI   Chromatin Ab SerPl-aCnc <0.2 0.0 - 0.9 AI   Anti JO-1 <0.2 0.0 - 0.9 AI   Centromere Ab Screen <0.2 0.0 - 0.9 AI   See below: Comment   CK  Result Value Ref  Range   Total CK 127 32 - 182 U/L  Uric acid  Result Value Ref Range   Uric Acid 4.6 2.6 - 6.2 mg/dL  Ehrlichia antibody panel  Result Value Ref Range   E.Chaffeensis (HME) IgG Negative Neg:<1:64   E. Chaffeensis (HME) IgM Titer Negative Neg:<1:20   HGE IgG Titer Negative Neg:<1:64   HGE IgM Titer Negative Neg:<1:20   Result Comment: Comment   Spotted Fever Group Antibodies  Result Value Ref Range   Spotted Fever Group IgG <1:64 Neg:<1:64   Spotted Fever Group IgM <1:64 Neg:<1:64   Result Comment Comment       Assessment & Plan:   Problem List Items Addressed This Visit   None    Follow up plan: No follow-ups on file.

## 2022-09-21 ENCOUNTER — Encounter: Payer: Self-pay | Admitting: Nurse Practitioner

## 2022-09-23 NOTE — Telephone Encounter (Signed)
Attempted to reach patient to schedule her an appointment with Rashelle or Erin regarding her concerns.  LVM to call office back.  Put in CRM.

## 2022-09-24 ENCOUNTER — Ambulatory Visit: Payer: Medicaid Other | Admitting: Physician Assistant

## 2022-09-24 NOTE — Progress Notes (Deleted)
          Acute Office Visit   Patient: Melissa Mendez   DOB: 1991/02/28   32 y.o. Female  MRN: [id number] Visit Date: 09/24/2022  Today's healthcare provider: Oswaldo Conroy Yasmyn Bellisario, PA-C  Introduced myself to the patient as a Secondary school teacher and provided education on APPs in clinical practice.    No chief complaint on file.  Subjective    HPI    Medications: Outpatient Medications Prior to Visit  Medication Sig   DULoxetine (CYMBALTA) 30 MG capsule Take 2 capsules (60 mg total) by mouth daily.   famotidine (PEPCID) 40 MG tablet Take 1 tablet (40 mg total) by mouth at bedtime.   gabapentin (NEURONTIN) 800 MG tablet TAKE 1 TABLET BY MOUTH THREE TIMES DAILY   methocarbamol (ROBAXIN) 500 MG tablet Take 1 tablet (500 mg total) by mouth every 8 (eight) hours as needed for muscle spasms. (Patient not taking: Reported on 06/18/2022)   naloxone [facility]) nasal spray 4 mg/0.1 mL SMARTSIG:Both Nares (Patient not taking: Reported on 06/18/2022)   pantoprazole (PROTONIX) 40 MG tablet Take 1 tablet (40 mg total) by mouth 2 (two) times daily.   Thiamine HCl (VITAMIN B-1) 250 MG tablet Take 250 mg by mouth daily. (Patient not taking: Reported on 02/18/2022)   valACYclovir (VALTREX) 1000 MG tablet Take 1 tablet (1,000 mg total) by mouth 2 (two) times daily.   valsartan (DIOVAN) 80 MG tablet Take 1 tablet (80 mg total) by mouth daily. (Patient not taking: Reported on 06/18/2022)   Vitamin D, Ergocalciferol, (DRISDOL) 1.25 MG (50000 UNIT) CAPS capsule Take 1 capsule (50,000 Units total) by mouth every 7 (seven) days.   No facility-administered medications prior to visit.    Review of Systems  {Labs  Heme  Chem  Endocrine  Serology  Results Review (optional):23779}   Objective    There were no vitals taken for this visit. {Show previous vital signs (optional):23777}  Physical Exam    No results found for any visits on 09/24/22.  Assessment & Plan      No follow-ups on file.

## 2022-09-25 ENCOUNTER — Ambulatory Visit: Payer: Medicaid Other | Admitting: Physician Assistant

## 2022-09-29 ENCOUNTER — Ambulatory Visit (INDEPENDENT_AMBULATORY_CARE_PROVIDER_SITE_OTHER): Payer: Medicaid Other | Admitting: Physician Assistant

## 2022-09-29 ENCOUNTER — Encounter: Payer: Self-pay | Admitting: Physician Assistant

## 2022-09-29 VITALS — BP 154/96 | HR 98 | Temp 97.9°F | Resp 18 | Ht 68.0 in | Wt 303.6 lb

## 2022-09-29 DIAGNOSIS — I10 Essential (primary) hypertension: Secondary | ICD-10-CM

## 2022-09-29 DIAGNOSIS — R112 Nausea with vomiting, unspecified: Secondary | ICD-10-CM | POA: Diagnosis not present

## 2022-09-29 LAB — CBC WITH DIFFERENTIAL/PLATELET
Basophils Relative: 1.1 %
Eosinophils Relative: 4.7 %
Hemoglobin: 11.9 g/dL (ref 11.7–15.5)
Neutrophils Relative %: 52.7 %
Platelets: 331 10*3/uL (ref 140–400)

## 2022-09-29 MED ORDER — ONDANSETRON 4 MG PO TBDP
4.0000 mg | ORAL_TABLET | Freq: Three times a day (TID) | ORAL | 0 refills | Status: DC | PRN
Start: 2022-09-29 — End: 2023-06-30

## 2022-09-29 MED ORDER — VALSARTAN 80 MG PO TABS: 80.0000 mg | ORAL_TABLET | Freq: Every day | ORAL | 0 refills | Status: DC

## 2022-09-29 NOTE — Assessment & Plan Note (Signed)
Chronic, historic condition Does not appear to be well managed- she states she is not sure if she has been taking her Valsartan for the last week or so Will provide refill and recommend follow up with PCP in 4 weeks to assess response

## 2022-09-29 NOTE — Patient Instructions (Addendum)
Please stop using the Metoclopramide at this time. Please use the Zofran as directed for nausea and vomiting Please do not use Marijuana for the foreseeable future- sometimes this can cause nausea and vomiting and if it is no longer in your system, we can rule it out as a potential cause.  You can continue to use the Pepcid and Protonix- you should still have refills left on these  When you are recovering from an episode of vomiting it is best to make sure you are hydrated- drink plenty of fluids, and eat small portions of bland foods until you are feeling better. You can slowly reintroduce small portions of normal dietary foods as you are able to tolerate them  You can also try to use Miralax daily to retrain your bowels until you are having regular bowel movements. Take the recommended dose once daily then gradually reduce the dose until you are having regular daily bowel movements that are comfortable. This may help with the gastroparesis and vomiting you are experiencing  It would not hurt to also keep a food journal and symptom journal to see if there are some correlations there.  Please follow up in 4 weeks to check your BP and status of your specialty apts

## 2022-09-29 NOTE — Assessment & Plan Note (Signed)
Chronic per patient, ongoing She has been evaluated by GI- reviewed notes which demonstrate food in stomach even after fasting. Gastric motility study was normal  She has not been using her Reglan very much- will try switching to Zofran 4 mg PO TID PRN to assist with symptoms After reviewing further HPI - recommend she employ a laxative regimen to increase frequency as this may be contributing to her gastroparesis We discussed her initial request for referral to Endocrinology- most recent labs for thyroid, DM, and autoimmune panel were normal. We reviewed that Endocrinology ref would likely not be appropriate at this time given labs. She would like to see a different GI provider- referral provided, requesting she see Dr. Allegra Lai Will also provide referral to Allergy to help rule out potential allergic component to GI upset Reviewed bland diet with small portions to assist with recovery from vomiting episodes - she and her mother voiced understanding and acceptance  Recommend follow up in 4 weeks to discuss progress and coordinate care from specialties

## 2022-09-29 NOTE — Progress Notes (Signed)
+         Acute Office Visit   Patient: Melissa Mendez   DOB: February 18, 1991   32 y.o. Female  MRN: [id number] Visit Date: 09/29/2022  Today's healthcare provider: Oswaldo Conroy Garland Smouse, PA-C  Introduced myself to the patient as a Secondary school teacher and provided education on APPs in clinical practice.    Chief Complaint  Patient presents with   Referral    Endocrinology   Nausea        Emesis   Subjective    HPI HPI     Referral    Additional comments: Endocrinology        Nausea    Additional comments:        Last edited by Providence Crosby, PA-C on 09/29/2022  2:00 PM.       Patient states she would like a referral for Endocrinology  Patient reports she is continuing to struggle with gastroparesis, nausea and vomiting She reports she has had these concerns for the past 6 years with varying severity and frequency She states vomiting seemed to improve somewhat after she moved out of a home with prolific mold  She reports that now she is having severe nausea and vomiting approx 3 times per week during which she will vomit for most of the day to the point she is cramping and fatigued  She reports sparingly using her Reglan as it only seems to help if she is able to take it prior to an episode but she does not have much of a warning before it starts She also reports concerns for developing side effects and states this has made her hesitant to use it more frequently  She reports vomiting typically will start soon after she wakes up and continue most of the day   She reports smoking marijuana occasionally but denies noticing an increase in her vomiting after doing so She has tried changing her diet and eliminating certain foods - she has identified some "safe" foods that do not seem to cause a reaction  She states vomiting can start even if she has not been eating throughout the day   She reports her typical bowel movements are once per week  with a lot of stool but she denies discomfort or pain  with these  She states she has had diarrhea since Sunday but she is close to her menstrual period     Medications: Outpatient Medications Prior to Visit  Medication Sig   DULoxetine (CYMBALTA) 30 MG capsule Take 2 capsules (60 mg total) by mouth daily.   famotidine (PEPCID) 40 MG tablet Take 1 tablet (40 mg total) by mouth at bedtime.   gabapentin (NEURONTIN) 800 MG tablet TAKE 1 TABLET BY MOUTH THREE TIMES DAILY   pantoprazole (PROTONIX) 40 MG tablet Take 1 tablet (40 mg total) by mouth 2 (two) times daily.   valACYclovir (VALTREX) 1000 MG tablet Take 1 tablet (1,000 mg total) by mouth 2 (two) times daily.   Vitamin D, Ergocalciferol, (DRISDOL) 1.25 MG (50000 UNIT) CAPS capsule Take 1 capsule (50,000 Units total) by mouth every 7 (seven) days.   [DISCONTINUED] valsartan (DIOVAN) 80 MG tablet Take 1 tablet (80 mg total) by mouth daily.   methocarbamol (ROBAXIN) 500 MG tablet Take 1 tablet (500 mg total) by mouth every 8 (eight) hours as needed for muscle spasms. (Patient not taking: Reported on 06/18/2022)   naloxone [facility]) nasal spray 4 mg/0.1 mL SMARTSIG:Both Nares (Patient not taking: Reported on 06/18/2022)   Thiamine  HCl (VITAMIN B-1) 250 MG tablet Take 250 mg by mouth daily. (Patient not taking: Reported on 02/18/2022)   No facility-administered medications prior to visit.    Review of Systems  Constitutional:  Positive for fatigue. Negative for chills and fever.  Gastrointestinal:  Positive for diarrhea, nausea and vomiting. Negative for blood in stool.       Objective    BP (!) 154/96 (BP Location: Right Arm, Patient Position: Sitting, Cuff Size: Normal)   Pulse 98   Temp 97.9 F (36.6 C) (Oral)   Resp 18   Ht 5\' 8"  (1.727 m)   Wt (!) 303 lb 9.6 oz (137.7 kg)   SpO2 98%   BMI 46.16 kg/m    Physical Exam Vitals reviewed.  Constitutional:      General: She is awake.     Appearance: Normal appearance. She is well-developed and well-groomed. She is obese.  HENT:      Head: Normocephalic and atraumatic.  Eyes:     General: Lids are normal. Gaze aligned appropriately.     Extraocular Movements: Extraocular movements intact.     Conjunctiva/sclera: Conjunctivae normal.  Pulmonary:     Effort: Pulmonary effort is normal.  Musculoskeletal:     Cervical back: Normal range of motion.  Skin:    General: Skin is warm and dry.     Findings: Bruising present.       Neurological:     Mental Status: She is alert.  Psychiatric:        Attention and Perception: Attention normal.        Mood and Affect: Mood and affect normal.        Speech: Speech normal.        Behavior: Behavior normal. Behavior is cooperative.       No results found for any visits on 09/29/22.  Assessment & Plan      Return in 4 weeks (on 10/27/2022) for Schedule CPE as well.      Problem List Items Addressed This Visit       Cardiovascular and Mediastinum   Hypertension    Chronic, historic condition Does not appear to be well managed- she states she is not sure if she has been taking her Valsartan for the last week or so Will provide refill and recommend follow up with PCP in 4 weeks to assess response       Relevant Medications   valsartan (DIOVAN) 80 MG tablet     Digestive   Nausea and vomiting in adult patient - Primary    Chronic per patient, ongoing She has been evaluated by GI- reviewed notes which demonstrate food in stomach even after fasting. Gastric motility study was normal  She has not been using her Reglan very much- will try switching to Zofran 4 mg PO TID PRN to assist with symptoms After reviewing further HPI - recommend she employ a laxative regimen to increase frequency as this may be contributing to her gastroparesis We discussed her initial request for referral to Endocrinology- most recent labs for thyroid, DM, and autoimmune panel were normal. We reviewed that Endocrinology ref would likely not be appropriate at this time given labs. She would like  to see a different GI provider- referral provided, requesting she see Dr. Allegra Lai Will also provide referral to Allergy to help rule out potential allergic component to GI upset Reviewed bland diet with small portions to assist with recovery from vomiting episodes - she and her mother voiced understanding and  acceptance  Recommend follow up in 4 weeks to discuss progress and coordinate care from specialties       Relevant Medications   ondansetron (ZOFRAN-ODT) 4 MG disintegrating tablet   Other Relevant Orders   COMPLETE METABOLIC PANEL WITH GFR   CBC w/Diff/Platelet   Ambulatory referral to Gastroenterology   Ambulatory referral to Allergy     Return in 4 weeks (on 10/27/2022) for Schedule CPE as well.   I, Junaid Wurzer E Blade Scheff, PA-C, have reviewed all documentation for this visit. The documentation on 09/29/22 for the exam, diagnosis, procedures, and orders are all accurate and complete.   Jacquelin Hawking, MHS, PA-C [facility]

## 2022-09-30 LAB — COMPLETE METABOLIC PANEL WITH GFR
AG Ratio: 1.8 (calc) (ref 1.0–2.5)
ALT: 14 U/L (ref 6–29)
AST: 12 U/L (ref 10–30)
Albumin: 4.4 g/dL (ref 3.6–5.1)
Alkaline phosphatase (APISO): 51 U/L (ref 31–125)
BUN: 10 mg/dL (ref 7–25)
CO2: 18 mmol/L — ABNORMAL LOW (ref 20–32)
Calcium: 9.4 mg/dL (ref 8.6–10.2)
Chloride: 110 mmol/L (ref 98–110)
Creat: 0.97 mg/dL (ref 0.50–0.97)
Globulin: 2.5 g/dL (calc) (ref 1.9–3.7)
Glucose, Bld: 103 mg/dL — ABNORMAL HIGH (ref 65–99)
Potassium: 4.6 mmol/L (ref 3.5–5.3)
Sodium: 143 mmol/L (ref 135–146)
Total Bilirubin: 0.2 mg/dL (ref 0.2–1.2)
Total Protein: 6.9 g/dL (ref 6.1–8.1)
eGFR: 79 mL/min/{1.73_m2} (ref >=60)

## 2022-09-30 LAB — CBC WITH DIFFERENTIAL/PLATELET
Absolute Monocytes: 506 cells/uL (ref 200–950)
Basophils Absolute: 87 cells/uL (ref 0–200)
Eosinophils Absolute: 371 cells/uL (ref 15–500)
HCT: 38 % (ref 35.0–45.0)
Lymphs Abs: 2773 cells/uL (ref 850–3900)
MCH: 25.3 pg — ABNORMAL LOW (ref 27.0–33.0)
MCHC: 31.3 g/dL — ABNORMAL LOW (ref 32.0–36.0)
MCV: 80.9 fL (ref 80.0–100.0)
MPV: 11.6 fL (ref 7.5–12.5)
Monocytes Relative: 6.4 %
Neutro Abs: 4163 cells/uL (ref 1500–7800)
RBC: 4.7 10*6/uL (ref 3.80–5.10)
RDW: 14.8 % (ref 11.0–15.0)
Total Lymphocyte: 35.1 %
WBC: 7.9 10*3/uL (ref 3.8–10.8)

## 2022-10-02 NOTE — Progress Notes (Signed)
Your labs are overall normal/stable at this time. Electrolytes are in normal range and liver and kidney function is in range.

## 2022-10-05 ENCOUNTER — Ambulatory Visit: Payer: Self-pay | Admitting: Psychiatry

## 2022-10-09 ENCOUNTER — Encounter: Payer: Self-pay | Admitting: Nurse Practitioner

## 2022-10-30 ENCOUNTER — Encounter: Payer: Self-pay | Admitting: Psychiatry

## 2022-10-30 ENCOUNTER — Ambulatory Visit: Payer: Medicaid Other | Admitting: Psychiatry

## 2022-10-30 VITALS — BP 158/100 | HR 75 | Temp 97.5°F | Ht 68.0 in | Wt 305.2 lb

## 2022-10-30 DIAGNOSIS — F17210 Nicotine dependence, cigarettes, uncomplicated: Secondary | ICD-10-CM | POA: Diagnosis not present

## 2022-10-30 DIAGNOSIS — F17200 Nicotine dependence, unspecified, uncomplicated: Secondary | ICD-10-CM

## 2022-10-30 DIAGNOSIS — R41840 Attention and concentration deficit: Secondary | ICD-10-CM

## 2022-10-30 DIAGNOSIS — F411 Generalized anxiety disorder: Secondary | ICD-10-CM | POA: Diagnosis not present

## 2022-10-30 DIAGNOSIS — F341 Dysthymic disorder: Secondary | ICD-10-CM | POA: Diagnosis not present

## 2022-10-30 DIAGNOSIS — F1290 Cannabis use, unspecified, uncomplicated: Secondary | ICD-10-CM

## 2022-10-30 MED ORDER — VARENICLINE TARTRATE 0.5 MG PO TABS
0.5000 mg | ORAL_TABLET | Freq: Two times a day (BID) | ORAL | 1 refills | Status: AC
Start: 2022-10-30 — End: ?

## 2022-10-30 NOTE — Progress Notes (Signed)
Psychiatric Initial Adult Assessment   Patient Identification: Melissa Mendez MRN:  [id number] Date of Evaluation:  10/30/2022 Referral Source: Dr.Megan Laural Benes Chief Complaint:   Chief Complaint  Patient presents with   Establish Care   Depression   Anxiety   Attention deficit   Medication Problem   Visit Diagnosis:    ICD-10-CM   1. Persistent depressive disorder with atypical features, currently mild  F34.1     2. GAD (generalized anxiety disorder)  F41.1     3. Attention and concentration deficit  R41.840     4. Tobacco use disorder  F17.200 varenicline (CHANTIX) 0.5 MG tablet    5. Long term current use of cannabis  F12.90       History of Present Illness:  Melissa Mendez is a 32 year old Caucasian female, married, employed, lives in [place], has a history of anxiety, depression, attention and concentration deficit, fibromyalgia, gastroparesis, morbid obesity, was evaluated in office today, presented to establish care.  Patient reports she has been struggling with depression and anxiety since the age of 32.  Reports she went through a break-up at that time which triggered her episodes.  She currently struggles with low motivation, low energy, sadness, anhedonia, excessive sleepiness, concentration problems.  She also reports episodes of questioning her existence although she denies any suicidality.  Patient reports she is currently taking Cymbalta, likely helping to some extent.  She also has fibromyalgia which also contributes to similar symptoms.  She is also on gabapentin for the same.  Patient does report anxiety symptoms constant worrying about things in general especially her bills, financial struggles.  Patient does report having racing thoughts about paying bills and she does have shakiness on and off.  Patient reports she did have panic attacks in the past this may have been  years ago.  She describes those episodes as having significant anxiety attacks,  hyperventilating, inability to swallow, which lasted for 30 to 40 minutes when it happened.  She reports initially going to distract herself focus on her breathing and similar techniques like that helped.  She currently denies having panic attacks.  Patient reports attention and concentration problems.  She reports she has struggled with it since the age of 32 or so.  She struggles with procrastination, organization skills, making careless mistakes, being forgetful.  Patient reports she tried to get a bachelor's degree however she could not complete.  She currently works in home health.  That is going well since she is able to push herself to go to work when she does not feel like it.  She reports she would like to be tested for ADHD since she is interested in going back to school to complete her degree program.  Patient denies any history of trauma.  Patient denies any mania or hypomanic symptoms.  Patient denies any OCD symptoms.  Patient denies any eating disorder problems.  Patient does report long-term use of cannabis as well as smokes cigarettes regularly as noted below in substance abuse history.  Patient struggles with multiple medical problems including fibromyalgia, gastroparesis and reports that also affects her ability to function on a daily basis   Associated Signs/Symptoms: Depression Symptoms:  depressed mood, anhedonia, hypersomnia, difficulty concentrating, loss of energy/fatigue, (Hypo) Manic Symptoms:   Denies Anxiety Symptoms:  Excessive Worry, Psychotic Symptoms:   Denies PTSD Symptoms: Negative  Past Psychiatric History: Patient reports she may have been under the care of a psychiatrist previously however does not remember the name.  Patient reports  she was being prescribed antidepressant by her primary care provider for depression and anxiety.  Denies any suicide attempts or self-injurious behaviors.  Denies inpatient behavioral health admissions.  Previous  Psychotropic Medications: Yes Lexapro, Wellbutrin-did not give it enough time.  Substance Abuse History in the last 12 months: Patient reports she started using cannabis at the age of 11.  Currently uses couple of times a week-1 bowl.  Consequences of Substance Abuse: Concentration problems due to the use of cannabis to be explored in future sessions.  Does report craving when she does not have it and some shakiness when she does not use it.  Past Medical History:  Past Medical History:  Diagnosis Date   Anxiety    Depression    Fibromyalgia    Fibromyalgia    Gastroparesis    Migraines    Obesity     Past Surgical History:  Procedure Laterality Date   ESOPHAGOGASTRODUODENOSCOPY (EGD) WITH PROPOFOL N/A 08/27/2020   Procedure: ESOPHAGOGASTRODUODENOSCOPY (EGD) WITH PROPOFOL;  Surgeon: Pasty Spillers, MD;  Location: [facility];  Service: Endoscopy;  Laterality: N/A;   TONSILLECTOMY AND ADENOIDECTOMY  1993   WISDOM TOOTH EXTRACTION      Family Psychiatric History: As noted below.  Family History:  Family History  Problem Relation Age of Onset   ADD / ADHD Mother    Anxiety disorder Mother    Hypertension Father    Drug abuse Maternal Uncle    Cancer Maternal Grandfather    Multiple sclerosis Maternal Grandmother     Social History:   Social History   Socioeconomic History   Marital status: Married    Spouse name: Not on file   Number of children: Not on file   Years of education: Not on file   Highest education level: Some college, no degree  Occupational History   Not on file  Tobacco Use   Smoking status: Every Day    Current packs/day: 1.00    Average packs/day: 1 pack/day for 15.4 years (15.4 ttl pk-yrs)    Types: Cigarettes    Start date: 05/2007   Smokeless tobacco: Never  Vaping Use   Vaping status: Never Used  Substance and Sexual Activity   Alcohol use: No    Alcohol/week: 0.0 standard drinks of alcohol   Drug use: No   Sexual  activity: Yes  Other Topics Concern   Not on file  Social History Narrative   Not on file   Social Determinants of Health   Financial Resource Strain: Not on file  Food Insecurity: Not on file  Transportation Needs: Not on file  Physical Activity: Not on file  Stress: Not on file  Social Connections: Not on file    Additional Social History: Patient was born in [place].  Patient reports she was raised by both parents.  She grew up in [place].  She has 1 sister.  She has a good relationship with her family.  Patient graduated high school, attempted to get a bachelor's degree however did not complete it.  Patient is married, together with her spouse since the past 15 years.  Denies having children.  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  Patient reports she is religious.  Denies having legal issues.  Denies having access to guns.  Allergies:   Allergies  Allergen Reactions   Sulfa Antibiotics Other (See Comments)    Was told as a child that she is allergic to sulfa drugs; reaction unknown  Metabolic Disorder Labs: Lab Results  Component Value Date   HGBA1C 5.7 (H) 06/18/2022   No results found for: "PROLACTIN" Lab Results  Component Value Date   CHOL 187 06/18/2022   TRIG 140 06/18/2022   HDL 40 06/18/2022   CHOLHDL 4.3 02/18/2022   LDLCALC 122 (H) 06/18/2022   LDLCALC 107 (H) 02/18/2022   Lab Results  Component Value Date   TSH 3.480 06/18/2022    Therapeutic Level Labs: No results found for: "LITHIUM" No results found for: "CBMZ" No results found for: "VALPROATE"  Current Medications: Current Outpatient Medications  Medication Sig Dispense Refill   DULoxetine (CYMBALTA) 30 MG capsule Take 2 capsules (60 mg total) by mouth daily. 60 capsule 1   famotidine (PEPCID) 40 MG tablet Take 1 tablet (40 mg total) by mouth at bedtime. 90 tablet 3   gabapentin (NEURONTIN) 800 MG tablet TAKE 1 TABLET BY MOUTH THREE TIMES DAILY 270 tablet 0    naloxone (NARCAN) nasal spray 4 mg/0.1 mL      ondansetron (ZOFRAN-ODT) 4 MG disintegrating tablet Take 1 tablet (4 mg total) by mouth every 8 (eight) hours as needed for nausea or vomiting. 20 tablet 0   pantoprazole (PROTONIX) 40 MG tablet Take 1 tablet (40 mg total) by mouth 2 (two) times daily. 180 tablet 1   valsartan (DIOVAN) 80 MG tablet Take 1 tablet (80 mg total) by mouth daily. 60 tablet 0   varenicline (CHANTIX) 0.5 MG tablet Take 1 tablet (0.5 mg total) by mouth 2 (two) times daily. 60 tablet 1   No current facility-administered medications for this visit.    Musculoskeletal: Strength & Muscle Tone: within normal limits Gait & Station: normal Patient leans: N/A  Psychiatric Specialty Exam: Review of Systems  Psychiatric/Behavioral:  Positive for decreased concentration, dysphoric mood and sleep disturbance. The patient is nervous/anxious.     Blood pressure (!) 158/100, pulse 75, temperature (!) 97.5 F (36.4 C), temperature source Skin, height 5\' 8"  (1.727 m), weight (!) 305 lb 3.2 oz (138.4 kg).Body mass index is 46.41 kg/m.  General Appearance: Casual  Eye Contact:  Good  Speech:  Clear and Coherent  Volume:  Normal  Mood:  Anxious and Depressed  Affect:  Appropriate  Thought Process:  Goal Directed and Descriptions of Associations: Intact  Orientation:  Full (Time, Place, and Person)  Thought Content:  Logical  Suicidal Thoughts:  No  Homicidal Thoughts:  No  Memory:  Immediate;   Fair Recent;   Fair Remote;   Fair  Judgement:  Fair  Insight:  Fair  Psychomotor Activity:  Normal  Concentration:  Concentration: Fair and Attention Span: Fair  Recall:  [employer] of Knowledge:Fair  Language: Fair  Akathisia:  No  Handed:  Left  AIMS (if indicated):  not done  Assets:  Communication Skills Desire for Improvement Housing Intimacy Social Support Talents/Skills  ADL's:  Intact  Cognition: WNL  Sleep:   Excessive   Screenings: GAD-7    Flowsheet Row  Office Visit from 10/30/2022 in [facility] Office Visit from 09/29/2022 in [facility] Office Visit from 06/18/2022 in [facility] Office Visit from 02/18/2022 in [facility] Office Visit from 09/01/2021 in [facility]  Total GAD-7 Score 6 3 5 3  0      PHQ2-9    Flowsheet Row Office Visit from 10/30/2022 in [facility] Office Visit from  09/29/2022 in [facility] Office Visit from 06/18/2022 in [facility] Office Visit from 02/18/2022 in [facility] Office Visit from 09/01/2021 in [name]  PHQ-2 Total Score 2 5 5 2  0  PHQ-9 Total Score 14 16 17 7 3       Flowsheet Row Office Visit from 10/30/2022 in [facility] Admission (Discharged) from 08/27/2020 in [facility]  C-SSRS RISK CATEGORY Low Risk No Risk       Assessment and Plan: TSURUE WARGEL is a 32 year old Caucasian female, multiple medical problems including fibromyalgia, currently presents with worsening attention and focus problems, as well as ongoing depression, anxiety.patient currently on antidepressants however due to comorbid fibromyalgia chronic pain from the same as well as gastroparesis, morbid obesity, it is likely her current symptoms multifactorial.  Discussed that she will benefit from the following plan. The patient demonstrates the following risk factors for suicide: Chronic risk factors for suicide include: psychiatric disorder of depression and chronic pain. Acute risk factors for suicide include:  medical problems . Protective factors for this patient include: positive social support, positive therapeutic relationship, coping skills, hope for the future, religious beliefs against  suicide, and life satisfaction. Considering these factors, the overall suicide risk at this point appears to be low. Patient is appropriate for outpatient follow up.  Plan Persistent depressive disorder, mild-unstable Will consider adding another antidepressant like Wellbutrin however patient with elevated blood pressure reading in session today blood pressure continued to be elevated on repeat.  Patient reports she was recently started on valsartan.  Patient advised to monitor blood pressure, give the medication enough time to take effect as well as to contact primary care provider for further management.  Once blood pressure is more under control will consider readjustment of medications.  GAD-unstable Continue Cymbalta 60 mg p.o. daily for now. Continue gabapentin as prescribed for pain although it also helps with anxiety Referral for CBT-patient provided resources in the community to establish care with therapist. Will consider medication changes in the future.  Attention and concentration deficit-unstable Will refer for ADHD testing to Dr. Kieth Brightly.  Also resources were provided in the community. Attention and focus problems likely multifactorial including sleep issues.  Patient will benefit from sleep study as well.  She will discuss with primary care provider.  Tobacco use disorder-unstable Will start Chantix 0.5 mg p.o. twice daily.  Provided medication education. Provided counseling for 5 minutes.  Long-term use of cannabis-unstable Provided counseling.  I have reviewed labs-dated 09/29/2022-CBC with differential-MCH/MCHC-abnormal otherwise within normal limits, CMP-glucose elevated at 103-otherwise within normal limits, TSH dated 06/18/2022-within normal limits.    Collaboration of Care: I have reviewed notes for primary care provider Dr. Waverly Ferrari 06/18/2022-anxiety and depression-not under good control Lexapro changed to Cymbalta.'  Patient/Guardian was advised Release of  Information must be obtained prior to any record release in order to collaborate their care with an outside provider. Patient/Guardian was advised if they have not already done so to contact the registration department to sign all necessary forms in order for [place] to release information regarding their care.   Consent: Patient/Guardian gives verbal consent for treatment and assignment of benefits for services provided during this visit. Patient/Guardian expressed understanding and agreed to proceed.   Follow-up in clinic in 4 to 6 weeks or sooner if needed.  This note was generated in part or whole with voice recognition software. Voice recognition  is usually quite accurate but there are transcription errors that can and very often do occur. I apologize for any typographical errors that were not detected and corrected.     Jomarie Longs, MD 7/12/20241:08 PM

## 2022-10-30 NOTE — Patient Instructions (Addendum)
[email or web address] [facility] [address] [place], [place], [place] [address]  [phone]  [email or web address] [facility] [address] [place], [place] [address]   [phone]  [email or web address] [facility] [address] [place], [place], [place] [address]   [phone]   [email or web address]  www.[email or web address]  [employer] [email or web address] [address], [place], [place] [address]  ~20.2 mi [phone]  [facility], [facility] [email or web address] [address] [place], [place] [address]  ~20.9 mi [phone]   Family solutions - [phone]  [facility] - [phone]  [facility] - [phone] counseling [phone]  [facility] [phone]   [employer]Park[employer]tn this clinician can offer telehealth and has a sliding scale option  [email or web address]-gentry.info/ this group also offers sliding scale rates and is based out of [place]  Dr. Liborio Nixon with the [facility] specializes in divorce  [employer] [facility] has interns who offer sliding scale rates and some of the full time clinicians do, as well. You complete their contact form on their website and the referrals coordinator will help to get connected to someone   Medicaid below :  [facility] [address] [place], [place] [address]  [phone]    [employer] [address] [place], [place] [address]  [phone]    [facility] [address] [place], [place] [address]  [phone]

## 2022-11-09 ENCOUNTER — Other Ambulatory Visit: Payer: Self-pay

## 2022-11-09 NOTE — Progress Notes (Unsigned)
Melissa Amy, PA-C [address]  [address]  [place], [place] [address]  Main: [phone]  Fax: [phone]   Primary Care Physician: Larae Grooms, NP  Primary Gastroenterologist:  Melissa Amy, PA-C / Dr. Wyline [place]    CC: Chronic Vomiting   HPI: Melissa Mendez is a 32 y.o. female who last saw Dr. Tobi Bastos 12/2021 for follow-up of chronic nausea and vomiting.  Previous patient of Dr. Maximino Greenland.  She takes pantoprazole 40 Mg twice daily and famotidine 40 Mg nightly.  EGD 08/2020 showed LA grade a distal esophagitis, findings concerning for esophageal candidiasis, mild gastritis, and a medium amount of food in the stomach.  Duodenal biopsies negative for celiac.  Stomach biopsies negative for H. pylori.  Esophageal brushings were negative for candidiasis.  Gastric emptying test 09/2020 was normal with no evidence of gastroparesis.  64% emptied at 1 hour, 83% emptied at 2 hours, 100% emptied at 3 hours.  Recent labs 09/29/2022 showed normal CMP.  Slightly low hemoglobin 11.9, MCV 80.  Normal white count 7.9.  Normal LFTs.  Has history of marijuana use for many years.  No NSAID use.  She has appointment with allergist in the near future to check for food allergies.  She mostly has vomiting in the morning first thing when she wakes up.  She is not having nausea or vomiting after eating meals.  She denies upper abdominal pain.  Currently taking pantoprazole 40 mg nightly and Pepcid 20 mg nightly.  She forgets to take twice daily medication.  Has had moderate weight gain in the past few years.  Episodes of vomiting occurs 2/week.  Always occurs first thing in the morning.  She does not feel nauseated.  She reports malodorous belching.  Has chronic pain.  She is having a bowel movement once per week.  She had only had 2 bowel movements in the past month.  Is not hard or painful.  She denies rectal bleeding.  Has had vomiting for at least 3 years.  It was bad in June.  Better in July.  She  has been avoiding marijuana for the past month.   Current Outpatient Medications  Medication Sig Dispense Refill   DULoxetine (CYMBALTA) 30 MG capsule Take 2 capsules (60 mg total) by mouth daily. 60 capsule 1   famotidine (PEPCID) 40 MG tablet Take 1 tablet (40 mg total) by mouth at bedtime. 90 tablet 3   gabapentin (NEURONTIN) 800 MG tablet TAKE 1 TABLET BY MOUTH THREE TIMES DAILY 270 tablet 0   ondansetron (ZOFRAN-ODT) 4 MG disintegrating tablet Take 1 tablet (4 mg total) by mouth every 8 (eight) hours as needed for nausea or vomiting. 20 tablet 0   pantoprazole (PROTONIX) 40 MG tablet Take 1 tablet (40 mg total) by mouth 2 (two) times daily. 180 tablet 1   valsartan (DIOVAN) 80 MG tablet Take 1 tablet (80 mg total) by mouth daily. 60 tablet 0   varenicline (CHANTIX) 0.5 MG tablet Take 1 tablet (0.5 mg total) by mouth 2 (two) times daily. 60 tablet 1   No current facility-administered medications for this visit.    Allergies as of 11/10/2022 - Review Complete 11/10/2022  Allergen Reaction Noted   Sulfa antibiotics Other (See Comments) 03/27/2011    Past Medical History:  Diagnosis Date   Anxiety    Depression    Fibromyalgia    Fibromyalgia    Gastroparesis    Migraines    Obesity     Past Surgical  History:  Procedure Laterality Date   ESOPHAGOGASTRODUODENOSCOPY (EGD) WITH PROPOFOL N/A 08/27/2020   Procedure: ESOPHAGOGASTRODUODENOSCOPY (EGD) WITH PROPOFOL;  Surgeon: Pasty Spillers, MD;  Location: [facility];  Service: Endoscopy;  Laterality: N/A;   TONSILLECTOMY AND ADENOIDECTOMY  1993   WISDOM TOOTH EXTRACTION      Review of Systems:    All systems reviewed and negative except where noted in HPI.   Physical Examination:   BP 138/80   Pulse 76   Temp 97.7 F (36.5 C)   Ht 5\' 8"  (1.727 m)   Wt (!) 313 lb (142 kg)   LMP 10/22/2022   BMI 47.59 kg/m   General: Well-nourished, well-developed in no acute distress.  Eyes: No icterus. Conjunctivae  pink. Mouth: Oropharyngeal mucosa moist and pink , no lesions erythema or exudate. Lungs: Clear to auscultation bilaterally. Non-labored. Heart: Regular rate and rhythm, no murmurs rubs or gallops.  Abdomen: Bowel sounds are normal; Abdomen is Soft; No hepatosplenomegaly, masses or hernias;  No Abdominal Tenderness; No guarding or rebound tenderness. Extremities: No lower extremity edema. No clubbing or deformities. Neuro: Alert and oriented x 3.  Grossly intact. Skin: Warm and dry, no jaundice.   Psych: Alert and cooperative, normal mood and affect.   Imaging Studies: No results found.  Assessment and Plan:   [name] is a 32 y.o. y/o female presents for follow-up of chronic nausea and vomiting.  Differential includes hyperemesis cannabis syndrome, cyclic vomiting syndrome, cholelithiasis, GERD with esophagitis, and gastritis.  Previous EGD showed biopsies negative for H. pylori and celiac.  Previous gastric emptying test was normal with no evidence of gastroparesis.  1.  Chronic nausea and vomiting  Schedule RUQ ultrasound to evaluate for gallstones.  Consider HIDA scan if ultrasound is normal.  2.  Anemia  Labs CBC, iron panel, ferritin, B12, celiac labs.  3.  GERD with esophagitis  Continue pantoprazole 40 Mg 1 tablet daily.  Increase to twice daily as needed.  Continue Pepcid famotidine 20 Mg once or twice daily.  Recommend Lifestyle Modifications to prevent Acid Reflux.  Rec. Avoid coffee, sodas, peppermint, citrus fruits, and spicey foods.  Avoid eating 2-3 hours before bedtime.  4.  Chronic Constipation  I gave her samples of Trulance 3 mg 1 tablet once daily to try.  If Trulance works well, she may call back for prescription.  5.  Chronic marijuana use  I discussed the risk of hyperemesis cannabis syndrome.  I encouraged her to avoid all marijuana use.  Also encouraged cessation of cigarette tobacco smoking.    Melissa Amy, PA-C  Follow up in 4 weeks.

## 2022-11-10 ENCOUNTER — Ambulatory Visit (INDEPENDENT_AMBULATORY_CARE_PROVIDER_SITE_OTHER): Payer: Medicaid Other | Admitting: Physician Assistant

## 2022-11-10 ENCOUNTER — Encounter: Payer: Self-pay | Admitting: Physician Assistant

## 2022-11-10 VITALS — BP 138/80 | HR 76 | Temp 97.7°F | Ht 68.0 in | Wt 313.0 lb

## 2022-11-10 DIAGNOSIS — K5901 Slow transit constipation: Secondary | ICD-10-CM | POA: Diagnosis not present

## 2022-11-10 DIAGNOSIS — R112 Nausea with vomiting, unspecified: Secondary | ICD-10-CM

## 2022-11-10 DIAGNOSIS — K2100 Gastro-esophageal reflux disease with esophagitis, without bleeding: Secondary | ICD-10-CM | POA: Diagnosis not present

## 2022-11-10 DIAGNOSIS — D649 Anemia, unspecified: Secondary | ICD-10-CM

## 2022-11-10 MED ORDER — TRULANCE 3 MG PO TABS: 1.0000 | ORAL_TABLET | Freq: Every day | ORAL | Status: AC

## 2022-11-10 NOTE — Patient Instructions (Signed)
Ultrasound scheduled 11/13/22 @ [facility] arrive at 7:30 am. Nothing to eat/drink after midnight.

## 2022-11-11 ENCOUNTER — Telehealth: Payer: Self-pay

## 2022-11-11 LAB — CBC WITH DIFFERENTIAL/PLATELET
Basophils Absolute: 0.1 10*3/uL (ref 0.0–0.2)
Basos: 1 %
Eos: 8 %
Hematocrit: 34 % (ref 34.0–46.6)
Hemoglobin: 10.7 g/dL — ABNORMAL LOW (ref 11.1–15.9)
Immature Grans (Abs): 0 10*3/uL (ref 0.0–0.1)
Immature Granulocytes: 0 %
Lymphs: 46 %
MCH: 25.4 pg — ABNORMAL LOW (ref 26.6–33.0)
Monocytes: 7 %
Neutrophils: 38 %
RBC: 4.22 x10E6/uL (ref 3.77–5.28)
WBC: 7 10*3/uL (ref 3.4–10.8)

## 2022-11-11 LAB — IRON,TIBC AND FERRITIN PANEL
Ferritin: 13 ng/mL — ABNORMAL LOW (ref 15–150)
Iron Saturation: 7 % — CL (ref 15–55)
UIBC: 281 ug/dL (ref 131–425)

## 2022-11-11 LAB — CELIAC DISEASE AB SCREEN W/RFX: IgA/Immunoglobulin A, Serum: 242 mg/dL (ref 87–352)

## 2022-11-11 LAB — VITAMIN B12: Vitamin B-12: 211 pg/mL — ABNORMAL LOW (ref 232–1245)

## 2022-11-11 NOTE — Telephone Encounter (Signed)
Tried reaching patient via phone-unable to leave message-   1.  Hemoglobin and iron are low, consistent with iron deficiency anemia.  We need to schedule an EGD and colonoscopy to evaluate iron deficiency anemia.  I also recommend start OTC ferrous sulfate iron 1 tablet once daily.  Hold iron 5 days before procedures.  Reschedule follow-up OV after EGD and colonoscopy procedures.  2.  Vitamin B12 is low.  Please start OTC vitamin B12 - 1000 mg once daily.  3.  Celiac labs are pending.

## 2022-11-11 NOTE — Progress Notes (Signed)
Notify pt: 1.  Hemoglobin and iron are low, consistent with iron deficiency anemia.  We need to schedule an EGD and colonoscopy to evaluate iron deficiency anemia.  I also recommend start OTC ferrous sulfate iron 1 tablet once daily.  Hold iron 5 days before procedures.  Reschedule follow-up OV after EGD and colonoscopy procedures. 2.  Vitamin B12 is low.  Please start OTC vitamin B12 - 1000 mg once daily. 3.  Celiac labs are pending.

## 2022-11-12 ENCOUNTER — Telehealth: Payer: Self-pay

## 2022-11-12 ENCOUNTER — Other Ambulatory Visit: Payer: Self-pay

## 2022-11-12 DIAGNOSIS — D509 Iron deficiency anemia, unspecified: Secondary | ICD-10-CM

## 2022-11-12 LAB — IRON,TIBC AND FERRITIN PANEL
Iron: 22 ug/dL — ABNORMAL LOW (ref 27–159)
Total Iron Binding Capacity: 303 ug/dL (ref 250–450)

## 2022-11-12 LAB — CBC WITH DIFFERENTIAL/PLATELET
EOS (ABSOLUTE): 0.6 10*3/uL — ABNORMAL HIGH (ref 0.0–0.4)
Lymphocytes Absolute: 3.1 10*3/uL (ref 0.7–3.1)
MCHC: 31.5 g/dL (ref 31.5–35.7)
MCV: 81 fL (ref 79–97)
Monocytes Absolute: 0.5 10*3/uL (ref 0.1–0.9)
Neutrophils Absolute: 2.7 10*3/uL (ref 1.4–7.0)
Platelets: 257 10*3/uL (ref 150–450)
RDW: 15.1 % (ref 11.7–15.4)

## 2022-11-12 LAB — CELIAC DISEASE AB SCREEN W/RFX
Antigliadin Abs, IgA: 8 units (ref 0–19)
Transglutaminase IgA: <2 U/mL (ref 0–3)

## 2022-11-12 MED ORDER — PEG 3350-KCL-NA BICARB-NACL 420 G PO SOLR: 4000.0000 mL | Freq: Once | ORAL | 0 refills | Status: AC

## 2022-11-12 NOTE — Telephone Encounter (Signed)
Spoke with patient EGD/Colonoscopy scheduled 12/23/22 w/ Dr.Anna -Golytely sent to pharmacy. Patient aware of all results below.  1.  Hemoglobin and iron are low, consistent with iron deficiency anemia.  We need to schedule an EGD and colonoscopy to evaluate iron deficiency anemia.  I also recommend start OTC ferrous sulfate iron 1 tablet once daily.  Hold iron 5 days before procedures.  Reschedule follow-up OV after EGD and colonoscopy procedures.  2.  Vitamin B12 is low.  Please start OTC vitamin B12 - 1000 mg once daily.  3.  Celiac labs are pending.

## 2022-11-13 ENCOUNTER — Telehealth: Payer: Self-pay

## 2022-11-13 ENCOUNTER — Ambulatory Visit: Admission: RE | Admit: 2022-11-13 | Payer: Medicaid Other | Source: Ambulatory Visit

## 2022-11-13 ENCOUNTER — Ambulatory Visit
Admission: RE | Admit: 2022-11-13 | Discharge: 2022-11-13 | Disposition: A | Discharge status: Home / Self Care | Payer: Medicaid Other | Source: Ambulatory Visit | Attending: Physician Assistant | Admitting: Physician Assistant

## 2022-11-13 DIAGNOSIS — R112 Nausea with vomiting, unspecified: Secondary | ICD-10-CM | POA: Diagnosis not present

## 2022-11-13 NOTE — Progress Notes (Signed)
Celiac labs are negative.  No evidence of celiac disease or gluten allergy.  Please schedule EGD and colonoscopy to evaluate iron deficiency and B12 deficiency anemia.

## 2022-11-13 NOTE — Telephone Encounter (Signed)
Patient notified. EGD/Colonoscopy scheduled for 12/23/22.  Celiac labs are negative.  No evidence of celiac disease or gluten allergy.  Please schedule EGD and colonoscopy to evaluate iron deficiency and B12 deficiency anemia.

## 2022-11-14 ENCOUNTER — Other Ambulatory Visit: Payer: Self-pay | Admitting: Nurse Practitioner

## 2022-11-14 DIAGNOSIS — M797 Fibromyalgia: Secondary | ICD-10-CM

## 2022-11-14 DIAGNOSIS — F32A Depression, unspecified: Secondary | ICD-10-CM

## 2022-11-15 NOTE — Progress Notes (Signed)
RUQ abdominal ultrasound shows sludge in the gallbladder.  No evidence of gallbladder infection.  Normal common bile duct 4.8 mm.  No liver lesions.  Liver is mildly enlarged.  Recommend order HIDA scan to further evaluate gallbladder function.  Diagnosis nausea and vomiting.  Continue low-fat diet.

## 2022-11-16 ENCOUNTER — Telehealth: Payer: Self-pay

## 2022-11-16 DIAGNOSIS — R112 Nausea with vomiting, unspecified: Secondary | ICD-10-CM

## 2022-11-16 NOTE — Telephone Encounter (Signed)
Requested Prescriptions  Pending Prescriptions Disp Refills   gabapentin (NEURONTIN) 800 MG tablet [Pharmacy Med Name: GABAPENTIN 800MG  TABLETS] 270 tablet 0    Sig: TAKE 1 TABLET BY MOUTH THREE TIMES DAILY     Neurology: Anticonvulsants - gabapentin Passed - 11/14/2022  9:35 AM      Passed - Cr in normal range and within 360 days    Creat  Date Value Ref Range Status  09/29/2022 0.97 0.50 - 0.97 mg/dL Final         Passed - Completed PHQ-2 or PHQ-9 in the last 360 days      Passed - Valid encounter within last 12 months    Recent Outpatient Visits           1 month ago Nausea and vomiting in adult patient   [facility] Mecum, Oswaldo Conroy, PA-C   5 months ago Anxiety and depression   [facility] [place], [place], DO   9 months ago Annual physical exam   [place] [facility] Larae Grooms, NP   9 months ago Acute maxillary sinusitis, recurrence not specified   [place] [name] Mecum, Oswaldo Conroy, PA-C   1 year ago Chronic pain of right knee    [facility] Larae Grooms, NP

## 2022-11-16 NOTE — Telephone Encounter (Signed)
Spoke with patient.  HIda scan scheduled 11/18/22 @ 8:15 arrival @ [facility]. No pain medication. Nothing to eat/drink 6 hours prior.    RUQ abdominal ultrasound shows sludge in the gallbladder.  No evidence of gallbladder infection.  Normal common bile duct 4.8 mm.  No liver lesions.  Liver is mildly enlarged.  Recommend order HIDA scan to further evaluate gallbladder function.  Diagnosis nausea and vomiting.  Continue low-fat diet.

## 2022-11-19 ENCOUNTER — Telehealth: Payer: Self-pay

## 2022-11-19 ENCOUNTER — Ambulatory Visit
Admission: RE | Admit: 2022-11-19 | Discharge: 2022-11-19 | Disposition: A | Discharge status: Home / Self Care | Payer: Medicaid Other | Source: Ambulatory Visit | Attending: Physician Assistant | Admitting: Physician Assistant

## 2022-11-19 DIAGNOSIS — R112 Nausea with vomiting, unspecified: Secondary | ICD-10-CM | POA: Insufficient documentation

## 2022-11-19 MED ORDER — TECHNETIUM TC 99M MEBROFENIN IV KIT
5.0000 | PACK | Freq: Once | INTRAVENOUS | Status: AC | PRN
  Administered 2022-11-19: 4.92 via INTRAVENOUS

## 2022-11-19 NOTE — Telephone Encounter (Signed)
Patient notified.   Notify pt. HIDA Scan is Normal.  Gallbladder is working properly.  Continue with current plan.

## 2022-11-19 NOTE — Progress Notes (Signed)
Notify pt. HIDA Scan is Normal.  Gallbladder is working properly.  Continue with current plan.

## 2022-11-24 ENCOUNTER — Other Ambulatory Visit: Payer: Medicaid Other

## 2022-12-08 ENCOUNTER — Telehealth: Payer: Self-pay | Admitting: Nurse Practitioner

## 2022-12-08 NOTE — Telephone Encounter (Unsigned)
Copied from CRM [id number]. Topic: General - Other >> Dec 08, 2022  8:52 AM Franchot Heidelberg wrote: Reason for CRM:   Angie calling from [place] partners called to report that they have been unable to contact the patient. No answer when they call and voicemail box is full.   Best contact: [phone]

## 2022-12-10 ENCOUNTER — Encounter: Payer: Self-pay | Admitting: Psychology

## 2022-12-13 NOTE — Progress Notes (Deleted)
Celso Amy, PA-C [address]  [address]  [place], [place] [address]  Main: [phone]  Fax: [phone]   Primary Care Physician: Larae Grooms, NP  Primary Gastroenterologist:  ***  CC: Follow-up iron deficiency anemia, B12 deficiency, nausea and vomiting  HPI: Melissa Mendez is a 32 y.o. female returns for 1 month follow-up of chronic nausea and vomiting.  HIDA scan 11/19/2022 was normal.  Gallbladder ejection fraction 59%.  RUQ ultrasound 11/13/2022 showed sludge in the gallbladder.  No gallstones or cholecystitis.  Enlarged liver measuring 18.12 cm.  Labs 11/10/2022 showed iron deficiency anemia with hemoglobin 10.7, MCV 81, low iron 22, iron saturation 7%, ferritin 13.  Vitamin B12 was also low at 211.  Celiac labs were negative.  She was started on iron and vitamin B supplement daily.  She is scheduled for EGD and colonoscopy 12/23/2022 to evaluate iron deficiency anemia with chronic nausea and vomiting.  -EGD 08/2020 showed LA grade a distal esophagitis, findings concerning for esophageal candidiasis, mild gastritis, and a medium amount of food in the stomach.  Duodenal biopsies negative for celiac.  Stomach biopsies negative for H. pylori.  Esophageal brushings were negative for candidiasis.  -Gastric emptying test 09/2020 was normal with no evidence of gastroparesis.  64% emptied at 1 hour, 83% emptied at 2 hours, 100% emptied at 3 hours.   Has history of marijuana use for many years.  No NSAID use.  Has recently been avoiding marijuana.  Current Outpatient Medications  Medication Sig Dispense Refill   DULoxetine (CYMBALTA) 30 MG capsule Take 2 capsules (60 mg total) by mouth daily. 60 capsule 1   famotidine (PEPCID) 40 MG tablet Take 1 tablet (40 mg total) by mouth at bedtime. 90 tablet 3   gabapentin (NEURONTIN) 800 MG tablet TAKE 1 TABLET BY MOUTH THREE TIMES DAILY 270 tablet 0   ondansetron (ZOFRAN-ODT) 4 MG disintegrating tablet Take 1 tablet (4 mg total) by  mouth every 8 (eight) hours as needed for nausea or vomiting. 20 tablet 0   pantoprazole (PROTONIX) 40 MG tablet Take 1 tablet (40 mg total) by mouth 2 (two) times daily. 180 tablet 1   valsartan (DIOVAN) 80 MG tablet Take 1 tablet (80 mg total) by mouth daily. 60 tablet 0   varenicline (CHANTIX) 0.5 MG tablet Take 1 tablet (0.5 mg total) by mouth 2 (two) times daily. 60 tablet 1   No current facility-administered medications for this visit.    Allergies as of 12/14/2022 - Review Complete 11/10/2022  Allergen Reaction Noted   Sulfa antibiotics Other (See Comments) 03/27/2011    Past Medical History:  Diagnosis Date   Anxiety    Depression    Fibromyalgia    Fibromyalgia    Gastroparesis    Migraines    Obesity     Past Surgical History:  Procedure Laterality Date   ESOPHAGOGASTRODUODENOSCOPY (EGD) WITH PROPOFOL N/A 08/27/2020   Procedure: ESOPHAGOGASTRODUODENOSCOPY (EGD) WITH PROPOFOL;  Surgeon: Pasty Spillers, MD;  Location[facility];  Service: Endoscopy;  Laterality: N/A;   TONSILLECTOMY AND ADENOIDECTOMY  1993   WISDOM TOOTH EXTRACTION      Review of Systems:    All systems reviewed and negative except where noted in HPI.   Physical Examination:   There were no vitals taken for this visit.  General: Well-nourished, well-developed in no acute distress.  Lungs: Clear to auscultation bilaterally. Non-labored. Heart: Regular rate and rhythm, no murmurs rubs or gallops.  Abdomen: Bowel sounds are  normal; Abdomen is Soft; No hepatosplenomegaly, masses or hernias;  No Abdominal Tenderness; No guarding or rebound tenderness. Neuro: Alert and oriented x 3.  Grossly intact.  Psych: Alert and cooperative, normal mood and affect.   Imaging Studies: NM Hepato W/EjeCT Fract  Result Date: 11/19/2022 CLINICAL DATA:  Evaluate gallbladder function. EXAM: NUCLEAR MEDICINE HEPATOBILIARY IMAGING WITH GALLBLADDER EF TECHNIQUE: Sequential images of the abdomen were  obtained out to 60 minutes following intravenous administration of radiopharmaceutical. After oral ingestion of Ensure, gallbladder ejection fraction was determined. At 60 min, normal ejection fraction is greater than 33%. RADIOPHARMACEUTICALS:  4.92 mCi Tc-48m  Choletec IV COMPARISON:  Ultrasound November 13, 2022 FINDINGS: Prompt uptake and biliary excretion of activity by the liver is seen. Gallbladder activity is visualized, consistent with patency of cystic duct. Biliary activity passes into small bowel, consistent with patent common bile duct. Calculated gallbladder ejection fraction is 59%. (Normal gallbladder ejection fraction with Ensure is greater than 33%.) IMPRESSION: 1.  Patent cystic and common bile ducts. 2.  Normal gallbladder ejection fraction. Electronically Signed   By: Maudry Mayhew M.D.   On: 11/19/2022 15:34    Assessment and Plan:   Melissa Mendez is a 32 y.o. y/o female ***    Celso Amy, PA-C  Follow up ***  BP check ***

## 2022-12-14 ENCOUNTER — Ambulatory Visit: Payer: Medicaid Other | Admitting: Physician Assistant

## 2022-12-17 ENCOUNTER — Encounter: Payer: Self-pay | Admitting: Physician Assistant

## 2022-12-18 ENCOUNTER — Telehealth: Payer: Self-pay

## 2022-12-18 MED ORDER — TRULANCE 3 MG PO TABS: 1.0000 | ORAL_TABLET | Freq: Every day | ORAL | 1 refills | Status: DC

## 2022-12-18 NOTE — Telephone Encounter (Signed)
Sent Trulance 3 mg to the pharmacy

## 2022-12-18 NOTE — Telephone Encounter (Signed)
Submitted PA through cover my meds for Trulance '3mg'$ . Waiting on response from insurance company

## 2022-12-22 ENCOUNTER — Other Ambulatory Visit: Payer: Self-pay | Admitting: Physician Assistant

## 2022-12-22 DIAGNOSIS — K5904 Chronic idiopathic constipation: Secondary | ICD-10-CM

## 2022-12-22 MED ORDER — LINACLOTIDE 145 MCG PO CAPS
145.0000 ug | ORAL_CAPSULE | Freq: Every day | ORAL | 5 refills | Status: AC
Start: 2022-12-22 — End: 2023-06-20

## 2022-12-22 NOTE — Telephone Encounter (Signed)
Insurance company denied the Trulance medication. They want her to try Lupiprostone, Amitiza or Linzess

## 2022-12-22 NOTE — Progress Notes (Signed)
Trulance is not covered by insurance.  I sent prescription for Linzess 145 mcg once daily to pharmacy.  Patient notified.

## 2022-12-23 ENCOUNTER — Encounter: Payer: Self-pay | Admitting: Certified Registered"

## 2022-12-23 ENCOUNTER — Other Ambulatory Visit: Payer: Self-pay

## 2022-12-23 ENCOUNTER — Telehealth: Payer: Self-pay | Admitting: Gastroenterology

## 2022-12-23 ENCOUNTER — Telehealth: Payer: Self-pay

## 2022-12-23 MED ORDER — NA SULFATE-K SULFATE-MG SULF 17.5-3.13-1.6 GM/177ML PO SOLN: 2.0000 | Freq: Once | ORAL | 0 refills | Status: AC

## 2022-12-23 NOTE — Telephone Encounter (Signed)
Patient needs to reschedule her procedure 

## 2022-12-23 NOTE — Telephone Encounter (Signed)
Procedure scheduled- discussed 2 day prep using suprep-mailed instructions and patient will call office if she has further questions. Spoke with [facility].

## 2022-12-28 ENCOUNTER — Telehealth: Payer: Self-pay

## 2022-12-28 NOTE — Telephone Encounter (Signed)
Patient notified.  OK to give SuPrep, however, she will need 2 day Liquid Prep and 2 SuPreps because she has history of Constipation.  Yesterday I sent a Rx for Linzess 145mg  to her pharmacy because her insurance would not cover Trulance.   Celso Amy, PA-C

## 2022-12-29 ENCOUNTER — Other Ambulatory Visit: Payer: Self-pay | Admitting: Nurse Practitioner

## 2022-12-29 ENCOUNTER — Other Ambulatory Visit: Payer: Self-pay | Admitting: Physician Assistant

## 2022-12-29 ENCOUNTER — Ambulatory Visit: Payer: Medicaid Other | Admitting: Psychiatry

## 2022-12-29 ENCOUNTER — Telehealth: Payer: Self-pay | Admitting: Nurse Practitioner

## 2022-12-29 DIAGNOSIS — I10 Essential (primary) hypertension: Secondary | ICD-10-CM

## 2022-12-29 NOTE — Telephone Encounter (Signed)
Pt called in , call dropped, tried to call back, no answer

## 2022-12-29 NOTE — Telephone Encounter (Signed)
Medication Refill - Medication:  valsartan (DIOVAN) 80 MG tablet , pantoprazole (PROTONIX) 40 MG tablet , DULoxetine (CYMBALTA) 30 MG capsule   Has the patient contacted their pharmacy? Yes.    (Agent: If yes, when and what did the pharmacy advise?) Contact PCP out of refills. Patient states she has a NPA in January and would like to ensure she has enough pills until appointment   Preferred Pharmacy (with phone number or street name):  [id number] [place], [place] - [address] AT [facility] OF [address] [facility] Phone: [phone]  Fax: [phone]       Has the patient been seen for an appointment in the last year OR does the patient have an upcoming appointment? No   Agent: Please be advised that RX refills may take up to 3 business days. We ask that you follow-up with your pharmacy.

## 2022-12-30 ENCOUNTER — Other Ambulatory Visit: Payer: Self-pay

## 2022-12-30 MED ORDER — DULOXETINE HCL 30 MG PO CPEP: 60.0000 mg | ORAL_CAPSULE | Freq: Every day | ORAL | 0 refills | Status: AC

## 2022-12-30 MED ORDER — FAMOTIDINE 40 MG PO TABS: 40.0000 mg | ORAL_TABLET | Freq: Every day | ORAL | 3 refills | Status: AC

## 2022-12-30 MED ORDER — PANTOPRAZOLE SODIUM 40 MG PO TBEC: 40.0000 mg | DELAYED_RELEASE_TABLET | Freq: Two times a day (BID) | ORAL | 0 refills | Status: DC

## 2022-12-30 NOTE — Telephone Encounter (Signed)
Requested Prescriptions  Pending Prescriptions Disp Refills   valsartan (DIOVAN) 80 MG tablet 60 tablet 0    Sig: Take 1 tablet (80 mg total) by mouth daily.     Cardiovascular:  Angiotensin Receptor Blockers Passed - 12/29/2022 12:04 PM      Passed - Cr in normal range and within 180 days    Creat  Date Value Ref Range Status  09/29/2022 0.97 0.50 - 0.97 mg/dL Final         Passed - K in normal range and within 180 days    Potassium  Date Value Ref Range Status  09/29/2022 4.6 3.5 - 5.3 mmol/L Final         Passed - Patient is not pregnant      Passed - Last BP in normal range    BP Readings from Last 1 Encounters:  11/10/22 138/80         Passed - Valid encounter within last 6 months    Recent Outpatient Visits           3 months ago Nausea and vomiting in adult patient   [facility] Mecum, Oswaldo Conroy, PA-C   6 months ago Anxiety and depression   [facility] [place], Megan P, DO   10 months ago Annual physical exam   [facility] Larae Grooms, NP   11 months ago Acute maxillary sinusitis, recurrence not specified   [place] [name] Mecum, Oswaldo Conroy, PA-C   1 year ago Chronic pain of right knee   [place] [facility] Larae Grooms, NP       Future Appointments             In 9 months Rodenbough, Aram Candela, PsyD [facility], CPR             pantoprazole (PROTONIX) 40 MG tablet 180 tablet 0    Sig: Take 1 tablet (40 mg total) by mouth 2 (two) times daily.     Gastroenterology: Proton Pump Inhibitors Passed - 12/29/2022 12:04 PM      Passed - Valid encounter within last 12 months    Recent Outpatient Visits           3 months ago Nausea and vomiting in adult patient   [facility] Mecum, Oswaldo Conroy, PA-C   6 months ago Anxiety and depression   [place] [place],  Megan P, DO   10 months ago Annual physical exam   [facility] Larae Grooms, NP   11 months ago Acute maxillary sinusitis, recurrence not specified   [place] [facility] Mecum, Oswaldo Conroy, PA-C   1 year ago Chronic pain of right knee   [facility] Larae Grooms, NP       Future Appointments             In 9 months Rodenbough, Aram Candela, PsyD [facility] Physical Medicine & Rehabilitation, CPR             DULoxetine (CYMBALTA) 30 MG capsule 180 capsule 0    Sig: Take 2 capsules (60 mg total) by mouth daily.     Psychiatry: Antidepressants - SNRI - duloxetine Passed - 12/29/2022 12:04 PM      Passed - Cr in normal range and within 360 days    Creat  Date Value Ref Range  Status  09/29/2022 0.97 0.50 - 0.97 mg/dL Final         Passed - eGFR is 30 or above and within 360 days    GFR calc Af Amer  Date Value Ref Range Status  12/01/2019 90 >59 mL/min/1.73 Final    Comment:    **[facility] currently reports eGFR in compliance with the current**   recommendations of the [employer]. [facility] will   update reporting as new guidelines are published from the NKF-ASN   Task force.    GFR calc non Af Amer  Date Value Ref Range Status  12/01/2019 78 >59 mL/min/1.73 Final   eGFR  Date Value Ref Range Status  09/29/2022 79 > OR = 60 mL/min/1.68m2 Final  06/18/2022 74 >59 mL/min/1.73 Final         Passed - Completed PHQ-2 or PHQ-9 in the last 360 days      Passed - Last BP in normal range    BP Readings from Last 1 Encounters:  11/10/22 138/80         Passed - Valid encounter within last 6 months    Recent Outpatient Visits           3 months ago Nausea and vomiting in adult patient   [facility] Mecum, Oswaldo Conroy, PA-C   6 months ago Anxiety and depression   [facility] [place], Megan P, DO   10 months ago Annual physical exam   [facility] Larae Grooms, NP   11 months ago Acute maxillary sinusitis, recurrence not specified   [place] [name] Mecum, Oswaldo Conroy, PA-C   1 year ago Chronic pain of right knee   [place] [facility] Larae Grooms, NP       Future Appointments             In 9 months Rodenbough, Aram Candela, PsyD [facility] Physical Medicine & Rehabilitation, CPR

## 2022-12-30 NOTE — Telephone Encounter (Signed)
Requested Prescriptions  Pending Prescriptions Disp Refills   valsartan (DIOVAN) 80 MG tablet [Pharmacy Med Name: VALSARTAN 80MG  TABLETS] 60 tablet 0    Sig: TAKE 1 TABLET(80 MG) BY MOUTH DAILY     Cardiovascular:  Angiotensin Receptor Blockers Passed - 12/29/2022  9:59 AM      Passed - Cr in normal range and within 180 days    Creat  Date Value Ref Range Status  09/29/2022 0.97 0.50 - 0.97 mg/dL Final         Passed - K in normal range and within 180 days    Potassium  Date Value Ref Range Status  09/29/2022 4.6 3.5 - 5.3 mmol/L Final         Passed - Patient is not pregnant      Passed - Last BP in normal range    BP Readings from Last 1 Encounters:  11/10/22 138/80         Passed - Valid encounter within last 6 months    Recent Outpatient Visits           3 months ago Nausea and vomiting in adult patient   [facility] Mecum, Oswaldo Conroy, PA-C   6 months ago Anxiety and depression   [facility] [place], Megan P, DO   10 months ago Annual physical exam   [place] [facility] Larae Grooms, NP   11 months ago Acute maxillary sinusitis, recurrence not specified   [place] [name] Mecum, Oswaldo Conroy, PA-C   1 year ago Chronic pain of right knee   [place] [facility] Larae Grooms, NP       Future Appointments             In 9 months Rodenbough, Aram Candela, PsyD [facility] Physical Medicine & Rehabilitation, CPR

## 2023-01-21 ENCOUNTER — Telehealth: Payer: Self-pay | Admitting: Physician Assistant

## 2023-01-21 ENCOUNTER — Other Ambulatory Visit: Payer: Self-pay

## 2023-01-21 DIAGNOSIS — J300 Vasomotor rhinitis: Secondary | ICD-10-CM | POA: Insufficient documentation

## 2023-01-21 NOTE — Telephone Encounter (Signed)
I was calling the patient to reschedule her appointment because her colonoscopy is on 02/09/2023, and the patient needed to come 4 weeks after her procedure. The patient said that she still need her appointment on Monday because the medicine she is taking for her acid reflux is not working and she needs to Melissa Mendez. Patient has been schedule for 03/09/2023 at 3:00 pm.

## 2023-01-25 ENCOUNTER — Ambulatory Visit (INDEPENDENT_AMBULATORY_CARE_PROVIDER_SITE_OTHER): Payer: Medicaid Other | Admitting: Physician Assistant

## 2023-01-25 ENCOUNTER — Ambulatory Visit: Payer: Medicaid Other | Admitting: Physician Assistant

## 2023-01-25 ENCOUNTER — Encounter: Payer: Self-pay | Admitting: Physician Assistant

## 2023-01-25 VITALS — BP 114/74 | HR 78 | Temp 98.4°F | Ht 68.0 in | Wt 299.0 lb

## 2023-01-25 DIAGNOSIS — Z79899 Other long term (current) drug therapy: Secondary | ICD-10-CM

## 2023-01-25 DIAGNOSIS — K2100 Gastro-esophageal reflux disease with esophagitis, without bleeding: Secondary | ICD-10-CM

## 2023-01-25 DIAGNOSIS — K5904 Chronic idiopathic constipation: Secondary | ICD-10-CM

## 2023-01-25 DIAGNOSIS — R112 Nausea with vomiting, unspecified: Secondary | ICD-10-CM

## 2023-01-25 DIAGNOSIS — D509 Iron deficiency anemia, unspecified: Secondary | ICD-10-CM

## 2023-01-25 DIAGNOSIS — E538 Deficiency of other specified B group vitamins: Secondary | ICD-10-CM | POA: Diagnosis not present

## 2023-01-25 DIAGNOSIS — K828 Other specified diseases of gallbladder: Secondary | ICD-10-CM

## 2023-01-25 DIAGNOSIS — K5909 Other constipation: Secondary | ICD-10-CM

## 2023-01-25 MED ORDER — ESOMEPRAZOLE MAGNESIUM 40 MG PO CPDR
40.0000 mg | DELAYED_RELEASE_CAPSULE | Freq: Two times a day (BID) | ORAL | 5 refills | Status: AC
Start: 2023-01-25 — End: 2023-07-24

## 2023-01-25 NOTE — Patient Instructions (Addendum)
Please take 290 mcg daily for 7 days prior to your procedure.  wE provided a sample of Clenpiq for your bowel prep.

## 2023-01-25 NOTE — Progress Notes (Signed)
Celso Amy, PA-C [address]  [address]  [place], [place] [address]  Main: [phone]  Fax: [phone]   Primary Care Physician: Larae Grooms, NP  Primary Gastroenterologist:  Celso Amy, PA-C / Dr. Wyline Mood    CC: Follow-up acid reflux  HPI: Melissa Mendez is a 32 y.o. female returns for 30-month follow-up of chronic nausea, vomiting, and acid reflux.  3 months ago pantoprazole was increased to 40 mg twice daily.  Continued on Pepcid 20 Mg once or twice daily.  She was given samples of Trulance for constipation.  Advised to avoid all marijuana use.  Patient states she is taking pantoprazole 40 mg twice daily, famotidine nightly, and TUMS prn which are not controlling her acid reflux.  She continues to have daily constant breakthrough heartburn.  She denies abdominal pain.  Nausea and vomiting episodes are less frequent and occur once per week.  Zofran helps.  She has lost 14 pounds since her last office visit, working on diet and weight loss.  She is also quitting smoking and has decreased cigarette use.  She tried Trulance which was cost prohibitive.  Currently taking Linzess 145 daily.  Currently having bowel movement 2 or 3 times per week.  Denies diarrhea, melena, or hematochezia.  She is not taking her iron or B12 supplement.  RUQ ultrasound 10/2022 showed sludge in the gallbladder.  No evidence of cholecystitis.  Enlarged liver 18.12 cm.  Normal echogenicity.  Normal bile duct 4.8 mm.  HIDA scan 11/19/2022 showed normal gallbladder ejection fraction 59%.  Labs 10/2022 showed iron deficiency anemia with hemoglobin 10.7, total iron 22, iron saturation 7%, ferritin 13.  B12 deficiency 211.  Celiac labs negative.  CMP normal 09/2022.  She was started on iron once daily and vitamin B12 1000 mg once daily.  Scheduled for EGD and colonoscopy (10/22) to further evaluate iron deficiency anemia.  She attempted colonoscopy previously and did not tolerate Suprep due to nausea and  vomiting.  EGD 08/2020 showed LA grade a distal esophagitis, findings concerning for esophageal candidiasis, mild gastritis, and a medium amount of food in the stomach.  Duodenal biopsies negative for celiac.  Stomach biopsies negative for H. pylori.  Esophageal brushings were negative for candidiasis.   Gastric emptying test 09/2020 was normal with no evidence of gastroparesis.  64% emptied at 1 hour, 83% emptied at 2 hours, 100% emptied at 3 hours.   Recent labs 09/29/2022 showed normal CMP.  Slightly low hemoglobin 11.9, MCV 80.  Normal white count 7.9.  Normal LFTs.   Has history of marijuana use for many years.  No NSAID use.  Current Outpatient Medications  Medication Sig Dispense Refill   DULoxetine (CYMBALTA) 30 MG capsule Take 2 capsules (60 mg total) by mouth daily. 180 capsule 0   esomeprazole (NEXIUM) 40 MG capsule Take 1 capsule (40 mg total) by mouth 2 (two) times daily before a meal. 60 capsule 5   famotidine (PEPCID) 40 MG tablet Take 1 tablet (40 mg total) by mouth at bedtime. 90 tablet 3   gabapentin (NEURONTIN) 800 MG tablet TAKE 1 TABLET BY MOUTH THREE TIMES DAILY 270 tablet 0   linaclotide (LINZESS) 145 MCG CAPS capsule Take 1 capsule (145 mcg total) by mouth daily before breakfast. 30 capsule 5   ondansetron (ZOFRAN-ODT) 4 MG disintegrating tablet Take 1 tablet (4 mg total) by mouth every 8 (eight) hours as needed for nausea or vomiting. 20 tablet 0   valsartan (DIOVAN) 80 MG  tablet TAKE 1 TABLET(80 MG) BY MOUTH DAILY 60 tablet 0   varenicline (CHANTIX) 0.5 MG tablet Take 1 tablet (0.5 mg total) by mouth 2 (two) times daily. 60 tablet 1   No current facility-administered medications for this visit.    Allergies as of 01/25/2023 - Review Complete 01/25/2023  Allergen Reaction Noted   Sulfa antibiotics Other (See Comments) 03/27/2011    Past Medical History:  Diagnosis Date   Anxiety    Depression    Fibromyalgia    Fibromyalgia    Gastroparesis    Migraines     Obesity     Past Surgical History:  Procedure Laterality Date   ESOPHAGOGASTRODUODENOSCOPY (EGD) WITH PROPOFOL N/A 08/27/2020   Procedure: ESOPHAGOGASTRODUODENOSCOPY (EGD) WITH PROPOFOL;  Surgeon: Pasty Spillers, MD;  Location: [facility];  Service: Endoscopy;  Laterality: N/A;   TONSILLECTOMY AND ADENOIDECTOMY  1993   WISDOM TOOTH EXTRACTION      Review of Systems:    All systems reviewed and negative except where noted in HPI.   Physical Examination:   BP 114/74   Pulse 78   Temp 98.4 F (36.9 C)   Ht 5\' 8"  (1.727 m)   Wt 299 lb (135.6 kg)   LMP 01/03/2023   BMI 45.46 kg/m   General: Well-nourished, well-developed in no acute distress.  Neuro: Alert and oriented x 3.  Grossly intact.  Psych: Alert and cooperative, normal mood and affect.   Imaging Studies: No results found.  Assessment and Plan:   Melissa Mendez is a 32 y.o. y/o female returns for follow-up of:  1.  Chronic nausea and vomiting  Lab: CMP, lipase  Continue Zofran as Needed.  2.  GERD with esophagitis: NOT controlled on Pantoprazole 40mg  BID.  Stop pantoprazole.  Start Nexium 40 Mg twice daily.  Continue Famotidine 40mg  at bedtime.  We discussed adverse side effects of PPIs to include vitamin deficiencies, osteoporosis, renal insufficiency.  Recommend take lowest effective dose of PPI necessary to control acid reflux. Continue Lifestyle modification to prevent reflux.  3.  Iron deficiency anemia  Continue with plan for EGD and colonoscopy on 02/09/2023 as scheduled.  Lab: CBC, iron panel, ferritin  If iron is low, then plan to increase iron to twice daily or refer for IV iron infusion.  Hold Iron 5 days before EGD/Colon procedures.  4.  B12 deficiency  Lab: Vitamin B12  Continue vitamin B12, 1000 mg daily.  5.  Chronic constipation  Increase Linzess from to daily, samples given.  6.  Gallbladder sludge / normal HIDA scan / normal LFTs.  If upper GI symptoms worsen  then refer to general surgeon to discuss cholecystectomy.  Recommend a low-fat diet  7.  Long term use medication (High dose PPI)  Check Vitamin D    Celso Amy, PA-C  Follow up 1 month after EGD/Colon with TG.

## 2023-01-26 ENCOUNTER — Other Ambulatory Visit: Payer: Self-pay | Admitting: Physician Assistant

## 2023-01-26 DIAGNOSIS — E559 Vitamin D deficiency, unspecified: Secondary | ICD-10-CM

## 2023-01-26 LAB — CBC WITH DIFFERENTIAL/PLATELET
Basophils Absolute: 0.1 10*3/uL (ref 0.0–0.2)
Basos: 1 %
EOS (ABSOLUTE): 0.4 10*3/uL (ref 0.0–0.4)
Eos: 5 %
Hematocrit: 36.2 % (ref 34.0–46.6)
Hemoglobin: 11.6 g/dL (ref 11.1–15.9)
Immature Grans (Abs): 0 10*3/uL (ref 0.0–0.1)
Immature Granulocytes: 0 %
Lymphocytes Absolute: 3 10*3/uL (ref 0.7–3.1)
Lymphs: 42 %
MCH: 26.5 pg — ABNORMAL LOW (ref 26.6–33.0)
MCHC: 32 g/dL (ref 31.5–35.7)
MCV: 83 fL (ref 79–97)
Monocytes Absolute: 0.4 10*3/uL (ref 0.1–0.9)
Monocytes: 5 %
Neutrophils Absolute: 3.4 10*3/uL (ref 1.4–7.0)
Neutrophils: 47 %
Platelets: 245 10*3/uL (ref 150–450)
RBC: 4.38 x10E6/uL (ref 3.77–5.28)
RDW: 14.6 % (ref 11.7–15.4)
WBC: 7.3 10*3/uL (ref 3.4–10.8)

## 2023-01-26 LAB — COMPREHENSIVE METABOLIC PANEL
ALT: 15 [IU]/L (ref 0–32)
AST: 15 [IU]/L (ref 0–40)
Albumin: 4.1 g/dL (ref 3.9–4.9)
Alkaline Phosphatase: 55 [IU]/L (ref 44–121)
BUN/Creatinine Ratio: 6 — ABNORMAL LOW (ref 9–23)
BUN: 6 mg/dL (ref 6–20)
Bilirubin Total: 0.2 mg/dL (ref 0.0–1.2)
CO2: 23 mmol/L (ref 20–29)
Calcium: 9.7 mg/dL (ref 8.7–10.2)
Chloride: 104 mmol/L (ref 96–106)
Creatinine, Ser: 0.97 mg/dL (ref 0.57–1.00)
Globulin, Total: 2.3 g/dL (ref 1.5–4.5)
Glucose: 97 mg/dL (ref 70–99)
Potassium: 4.5 mmol/L (ref 3.5–5.2)
Sodium: 140 mmol/L (ref 134–144)
Total Protein: 6.4 g/dL (ref 6.0–8.5)
eGFR: 79 mL/min/{1.73_m2} (ref >59)

## 2023-01-26 LAB — LIPASE: Lipase: 17 U/L (ref 14–72)

## 2023-01-26 LAB — IRON,TIBC AND FERRITIN PANEL
Ferritin: 14 ng/mL — ABNORMAL LOW (ref 15–150)
Iron Saturation: 17 % (ref 15–55)
Iron: 54 ug/dL (ref 27–159)
Total Iron Binding Capacity: 327 ug/dL (ref 250–450)
UIBC: 273 ug/dL (ref 131–425)

## 2023-01-26 LAB — VITAMIN D 25 HYDROXY (VIT D DEFICIENCY, FRACTURES): Vit D, 25-Hydroxy: 17.2 ng/mL — ABNORMAL LOW (ref 30.0–100.0)

## 2023-01-26 LAB — VITAMIN B12: Vitamin B-12: 250 pg/mL (ref 232–1245)

## 2023-01-26 MED ORDER — CHOLECALCIFEROL 1.25 MG (50000 UT) PO TABS: 1.0000 | ORAL_TABLET | ORAL | 0 refills | Status: AC

## 2023-01-26 NOTE — Progress Notes (Signed)
Labs show: 1.  Hemoglobin has improved from 10.7 up to 11.6, within normal range now. 2.  Iron has improved, yet still low end of normal.  Continue iron tablet daily.  (Note hold iron 5 days prior to EGD/colonoscopy). 3.  Vitamin B12 has improved, yet still low normal.  Continue vitamin B12 daily. 4.  Glucose, electrolytes, kidney and liver test are normal. 5.  Vitamin D is very low.  I recommend that you start a prescription: Vitamin D 50,000 units once per week for 8 weeks.  I sent prescription to pharmacy.  We need to repeat labs in 6 weeks at your next office visit. Celso Amy, PA-C

## 2023-02-01 ENCOUNTER — Telehealth: Payer: Self-pay

## 2023-02-01 NOTE — Telephone Encounter (Signed)
Patient notified.  Hi Melissa Mendez, your labs show: 1.  Hemoglobin has improved from 10.7 up to 11.6, within normal range now. 2.  Iron has improved, yet still low end of normal.  Continue iron tablet daily.  (Note hold iron 5 days prior to EGD/colonoscopy). ...

## 2023-02-09 ENCOUNTER — Telehealth: Payer: Self-pay

## 2023-02-09 ENCOUNTER — Ambulatory Visit: Admission: RE | Admit: 2023-02-09 | Payer: Medicaid Other | Source: Ambulatory Visit | Admitting: Gastroenterology

## 2023-02-09 ENCOUNTER — Telehealth: Payer: Self-pay | Admitting: *Deleted

## 2023-02-09 SURGERY — COLONOSCOPY WITH PROPOFOL
Anesthesia: General

## 2023-02-09 MED ORDER — PEG 3350-KCL-NA BICARB-NACL 420 G PO SOLR
8000.0000 mL | Freq: Once | ORAL | 0 refills | Status: AC
Start: 2023-02-09 — End: 2023-02-09

## 2023-02-09 NOTE — Telephone Encounter (Signed)
Rescheduled colonoscopy to 03/17/23 with 2 day prep- golytely. Sample Linzess 290 mcg at front desk for patient to pick up.

## 2023-02-09 NOTE — Telephone Encounter (Signed)
     was contact by endo unit. Patient will be need to be reschedule for her colonoscopy and EGD. She is having issue with the prep solution. Patient woke up at 2 am and did not start going until 4 am. However, feeling horrible to come in to complete the procedures. Patient kept talking table there medication for the constipation.    Please call patient.

## 2023-02-09 NOTE — Telephone Encounter (Signed)
Spoke with patient-she was unable to have colonoscopy today-she started the prep on time however it did not start working until 4 am this morning- patient complained of being constipation and she stated the linzess may not be strong enough and I let her know about Linzess 290 mcg- she would like to try this dose---- also I suggested 2 day prep due to constipation and patient agreed.Please advise. I will get her rescheduled for procedure.

## 2023-02-09 NOTE — Telephone Encounter (Signed)
I was contact by endo unit. Patient will be need to be reschedule for her colonoscopy and EGD. She is having issue with the prep solution. Patient woke up at 2 am and did not start going until 4 am. However, feeling horrible to come in to complete the procedures. Patient kept talking table there medication for the constipation.   Please call patient.

## 2023-02-09 NOTE — Telephone Encounter (Signed)
error 

## 2023-02-18 DIAGNOSIS — R7303 Prediabetes: Secondary | ICD-10-CM | POA: Insufficient documentation

## 2023-02-18 HISTORY — DX: Prediabetes: R73.03

## 2023-02-18 HISTORY — DX: Morbid (severe) obesity due to excess calories: E66.01

## 2023-02-22 ENCOUNTER — Other Ambulatory Visit: Payer: Self-pay | Admitting: Nurse Practitioner

## 2023-02-22 DIAGNOSIS — M797 Fibromyalgia: Secondary | ICD-10-CM

## 2023-02-22 DIAGNOSIS — F32A Depression, unspecified: Secondary | ICD-10-CM

## 2023-02-23 NOTE — Telephone Encounter (Signed)
Requested Prescriptions  Pending Prescriptions Disp Refills   gabapentin (NEURONTIN) 800 MG tablet [Pharmacy Med Name: GABAPENTIN 800MG  TABLETS] 270 tablet 0    Sig: TAKE 1 TABLET BY MOUTH THREE TIMES DAILY     Neurology: Anticonvulsants - gabapentin Passed - 02/22/2023  2:51 PM      Passed - Cr in normal range and within 360 days    Creat  Date Value Ref Range Status  09/29/2022 0.97 0.50 - 0.97 mg/dL Final   Creatinine, Ser  Date Value Ref Range Status  01/25/2023 0.97 0.57 - 1.00 mg/dL Final         Passed - Completed PHQ-2 or PHQ-9 in the last 360 days      Passed - Valid encounter within last 12 months    Recent Outpatient Visits           4 months ago Nausea and vomiting in adult patient   [facility] Mecum, Oswaldo Conroy, PA-C   8 months ago Anxiety and depression   [place] [facility] Redmond, Megan [place], DO   1 year ago Annual physical exam   [place] [facility] Larae Grooms, NP   1 year ago Acute maxillary sinusitis, recurrence not specified   [place] [name] Mecum, Oswaldo Conroy, PA-C   1 year ago Chronic pain of right knee   [place] [facility] Larae Grooms, NP       Future Appointments             In 7 months Roden[place]ough, Aram Candela, PsyD [facility], CPR

## 2023-03-09 ENCOUNTER — Ambulatory Visit: Payer: Medicaid Other | Admitting: Physician Assistant

## 2023-03-15 ENCOUNTER — Telehealth: Payer: Self-pay | Admitting: Physician Assistant

## 2023-03-15 ENCOUNTER — Other Ambulatory Visit: Payer: Self-pay

## 2023-03-15 ENCOUNTER — Telehealth: Payer: Self-pay

## 2023-03-15 DIAGNOSIS — K5901 Slow transit constipation: Secondary | ICD-10-CM

## 2023-03-15 DIAGNOSIS — K219 Gastro-esophageal reflux disease without esophagitis: Secondary | ICD-10-CM

## 2023-03-15 NOTE — Telephone Encounter (Signed)
Patient called in about her prep because when she called the pharmacy they were unable to tell her anything. The patient does not know the name of the prep so she is not sure what to ask for.

## 2023-03-15 NOTE — Telephone Encounter (Signed)
Scheduled egd/colon 03/17/23- Sutab sample provided to patient.

## 2023-03-15 NOTE — Telephone Encounter (Signed)
Patient called back to speak with Inetta Fermo nurse.

## 2023-03-17 ENCOUNTER — Ambulatory Visit: Payer: Medicaid Other

## 2023-03-17 ENCOUNTER — Telehealth: Payer: Self-pay

## 2023-03-17 ENCOUNTER — Ambulatory Visit
Admission: RE | Admit: 2023-03-17 | Discharge: 2023-03-17 | Disposition: A | Discharge status: Home / Self Care | Payer: Medicaid Other | Source: Ambulatory Visit | Attending: Gastroenterology | Admitting: Gastroenterology

## 2023-03-17 ENCOUNTER — Encounter
Admission: RE | Disposition: A | Discharge status: Home / Self Care | Payer: Self-pay | Source: Ambulatory Visit | Attending: Gastroenterology

## 2023-03-17 ENCOUNTER — Encounter: Payer: Self-pay | Admitting: Gastroenterology

## 2023-03-17 DIAGNOSIS — K5900 Constipation, unspecified: Secondary | ICD-10-CM | POA: Insufficient documentation

## 2023-03-17 DIAGNOSIS — K219 Gastro-esophageal reflux disease without esophagitis: Secondary | ICD-10-CM | POA: Insufficient documentation

## 2023-03-17 DIAGNOSIS — Z539 Procedure and treatment not carried out, unspecified reason: Secondary | ICD-10-CM | POA: Diagnosis not present

## 2023-03-17 HISTORY — PX: ESOPHAGOGASTRODUODENOSCOPY (EGD) WITH PROPOFOL: SHX5813

## 2023-03-17 HISTORY — PX: COLONOSCOPY WITH PROPOFOL: SHX5780

## 2023-03-17 SURGERY — COLONOSCOPY WITH PROPOFOL
Anesthesia: General

## 2023-03-17 NOTE — Telephone Encounter (Signed)
Per Dr. Tobi Bastos patient is not cleaned out.  Contact her on Monday to schedule repeat colonoscopy on Tuesday.  Informed Dr. Tobi Bastos that Tuesday is full-he is okay with scheduling her sometime in January.  Thanks,  [place], [place]

## 2023-03-17 NOTE — H&P (Signed)
Cancelled in adequate prep

## 2023-03-22 ENCOUNTER — Encounter: Payer: Self-pay | Admitting: Gastroenterology

## 2023-04-02 ENCOUNTER — Other Ambulatory Visit: Payer: Self-pay

## 2023-04-02 ENCOUNTER — Telehealth: Payer: Self-pay

## 2023-04-02 DIAGNOSIS — K5904 Chronic idiopathic constipation: Secondary | ICD-10-CM

## 2023-04-02 MED ORDER — SUTAB 1479-225-188 MG PO TABS: ORAL_TABLET | ORAL | 0 refills | Status: AC

## 2023-04-02 NOTE — Telephone Encounter (Signed)
Pt requesting call back to reschedule colonoscopy

## 2023-04-05 NOTE — Telephone Encounter (Signed)
Samples Linzess 290 placed at front desk for patient to pick up.

## 2023-05-05 ENCOUNTER — Telehealth: Payer: Self-pay | Admitting: Physician Assistant

## 2023-05-05 ENCOUNTER — Telehealth: Payer: Self-pay | Admitting: *Deleted

## 2023-05-05 NOTE — Telephone Encounter (Signed)
 Patient was transfer from endo unit. Patient need to reschedule her colonoscopy that was schedule on 05/11/2023. Patient needs it to be schedule on a Wednesday because husband is off on that day of the week and unable to take the 21 off.  Requesting the next available Wednesday which is 05/26/2023.  New instructions will be sent.

## 2023-05-05 NOTE — Telephone Encounter (Signed)
 The patient called in to schedule an follow up appointment with Mrs. Marcille Severance.

## 2023-05-24 MED ORDER — SUTAB 1479-225-188 MG PO TABS
ORAL_TABLET | ORAL | 0 refills | Status: DC
Start: 2023-05-24 — End: 2023-05-24

## 2023-05-24 NOTE — Addendum Note (Signed)
Addended by: Tawnya Crook on: 05/24/2023 02:10 PM   Modules accepted: Orders

## 2023-05-24 NOTE — Addendum Note (Signed)
Addended by: Tawnya Crook on: 05/24/2023 02:11 PM   Modules accepted: Orders

## 2023-05-25 ENCOUNTER — Encounter: Payer: Self-pay | Admitting: Gastroenterology

## 2023-05-25 ENCOUNTER — Telehealth: Payer: Self-pay

## 2023-05-25 NOTE — Telephone Encounter (Signed)
 Patient states she can not take the Liquid bowel prep and the tablet bowel prep her pharmacy is out of stock. She states she had to reschedule and cancel this procedure so many times because she can not keep the prep down. She needs to know where she can get the Sutab  at

## 2023-05-26 ENCOUNTER — Encounter
Admission: RE | Disposition: A | Discharge status: Home / Self Care | Payer: Self-pay | Source: Ambulatory Visit | Attending: Gastroenterology

## 2023-05-26 ENCOUNTER — Encounter: Payer: Self-pay | Admitting: Gastroenterology

## 2023-05-26 ENCOUNTER — Encounter: Payer: Self-pay | Admitting: Anesthesiology

## 2023-05-26 ENCOUNTER — Ambulatory Visit
Admission: RE | Admit: 2023-05-26 | Discharge: 2023-05-26 | Disposition: A | Discharge status: Home / Self Care | Payer: Medicaid Other | Source: Ambulatory Visit | Attending: Gastroenterology | Admitting: Gastroenterology

## 2023-05-26 DIAGNOSIS — Z539 Procedure and treatment not carried out, unspecified reason: Secondary | ICD-10-CM | POA: Insufficient documentation

## 2023-05-26 DIAGNOSIS — K5900 Constipation, unspecified: Secondary | ICD-10-CM | POA: Insufficient documentation

## 2023-05-26 DIAGNOSIS — K5904 Chronic idiopathic constipation: Secondary | ICD-10-CM

## 2023-05-26 SURGERY — COLONOSCOPY WITH PROPOFOL
Anesthesia: General

## 2023-05-26 MED ORDER — LIDOCAINE HCL (PF) 2 % IJ SOLN
INTRAMUSCULAR | Status: AC
  Filled 2023-05-26: qty 5

## 2023-05-26 MED ORDER — LIDOCAINE HCL (PF) 2 % IJ SOLN
INTRAMUSCULAR | Status: AC
  Filled 2023-05-26: qty 10

## 2023-05-26 MED ORDER — DEXMEDETOMIDINE HCL IN NACL 80 MCG/20ML IV SOLN
INTRAVENOUS | Status: AC
  Filled 2023-05-26: qty 40

## 2023-05-26 MED ORDER — SODIUM CHLORIDE 0.9 % IV SOLN: INTRAVENOUS | Status: DC

## 2023-05-26 NOTE — Progress Notes (Signed)
Colonoscopy will be rescheduled for tomorrow.

## 2023-05-26 NOTE — Telephone Encounter (Signed)
 Received phone call from Trish in Endo stating that patient needed to be placed back on Dr. Williemae schedule for tomorrow.  Provided Trish with the diagnosis code to put the colonoscopy order back in.  Patient will need a new prep sent to the pharmacy and needs to be contacted to provide instructions for prep.  Message routed to Tina's CMA Shelia to address.  Thanks,  Rosaline CMA

## 2023-05-26 NOTE — Progress Notes (Signed)
 Urine pregnancy test was negative

## 2023-05-27 ENCOUNTER — Encounter
Admission: RE | Disposition: A | Discharge status: Home / Self Care | Payer: Self-pay | Source: Ambulatory Visit | Attending: Gastroenterology

## 2023-05-27 ENCOUNTER — Encounter: Payer: Self-pay | Admitting: Gastroenterology

## 2023-05-27 ENCOUNTER — Ambulatory Visit
Admission: RE | Admit: 2023-05-27 | Discharge: 2023-05-27 | Disposition: A | Discharge status: Home / Self Care | Payer: Medicaid Other | Source: Ambulatory Visit | Attending: Gastroenterology | Admitting: Gastroenterology

## 2023-05-27 ENCOUNTER — Ambulatory Visit: Payer: Medicaid Other | Admitting: Anesthesiology

## 2023-05-27 DIAGNOSIS — F17210 Nicotine dependence, cigarettes, uncomplicated: Secondary | ICD-10-CM | POA: Insufficient documentation

## 2023-05-27 DIAGNOSIS — D509 Iron deficiency anemia, unspecified: Secondary | ICD-10-CM | POA: Diagnosis not present

## 2023-05-27 DIAGNOSIS — R519 Headache, unspecified: Secondary | ICD-10-CM | POA: Diagnosis not present

## 2023-05-27 DIAGNOSIS — R7303 Prediabetes: Secondary | ICD-10-CM | POA: Diagnosis not present

## 2023-05-27 DIAGNOSIS — F419 Anxiety disorder, unspecified: Secondary | ICD-10-CM | POA: Diagnosis not present

## 2023-05-27 DIAGNOSIS — K219 Gastro-esophageal reflux disease without esophagitis: Secondary | ICD-10-CM | POA: Diagnosis not present

## 2023-05-27 DIAGNOSIS — Z79899 Other long term (current) drug therapy: Secondary | ICD-10-CM | POA: Diagnosis not present

## 2023-05-27 DIAGNOSIS — Z6841 Body Mass Index (BMI) 40.0 and over, adult: Secondary | ICD-10-CM | POA: Diagnosis not present

## 2023-05-27 DIAGNOSIS — M797 Fibromyalgia: Secondary | ICD-10-CM | POA: Insufficient documentation

## 2023-05-27 DIAGNOSIS — K5909 Other constipation: Secondary | ICD-10-CM | POA: Insufficient documentation

## 2023-05-27 DIAGNOSIS — F32A Depression, unspecified: Secondary | ICD-10-CM | POA: Insufficient documentation

## 2023-05-27 DIAGNOSIS — D649 Anemia, unspecified: Secondary | ICD-10-CM

## 2023-05-27 HISTORY — DX: Prediabetes: R73.03

## 2023-05-27 HISTORY — PX: COLONOSCOPY: SHX5424

## 2023-05-27 HISTORY — PX: ESOPHAGOGASTRODUODENOSCOPY (EGD) WITH PROPOFOL: SHX5813

## 2023-05-27 HISTORY — PX: BIOPSY: SHX5522

## 2023-05-27 SURGERY — COLONOSCOPY
Anesthesia: General

## 2023-05-27 MED ORDER — PROPOFOL 500 MG/50ML IV EMUL
INTRAVENOUS | Status: DC | PRN
  Administered 2023-05-27: 150 ug/kg/min via INTRAVENOUS

## 2023-05-27 MED ORDER — PROPOFOL 10 MG/ML IV BOLUS
INTRAVENOUS | Status: DC | PRN
  Administered 2023-05-27 (×3): 100 mg via INTRAVENOUS

## 2023-05-27 MED ORDER — MIDAZOLAM HCL 2 MG/2ML IJ SOLN
INTRAMUSCULAR | Status: DC | PRN
  Administered 2023-05-27: 2 mg via INTRAVENOUS

## 2023-05-27 MED ORDER — MIDAZOLAM HCL 2 MG/2ML IJ SOLN
INTRAMUSCULAR | Status: AC
Start: 2023-05-27 — End: ?
  Filled 2023-05-27: qty 2

## 2023-05-27 MED ORDER — LIDOCAINE HCL (CARDIAC) PF 100 MG/5ML IV SOSY
PREFILLED_SYRINGE | INTRAVENOUS | Status: DC | PRN
  Administered 2023-05-27: 80 mg via INTRAVENOUS

## 2023-05-27 MED ORDER — SODIUM CHLORIDE 0.9 % IV SOLN: INTRAVENOUS | Status: DC

## 2023-05-27 MED ORDER — DEXMEDETOMIDINE HCL IN NACL 80 MCG/20ML IV SOLN
INTRAVENOUS | Status: DC | PRN
  Administered 2023-05-27: 12 ug via INTRAVENOUS

## 2023-05-27 NOTE — Anesthesia Preprocedure Evaluation (Addendum)
 Anesthesia Evaluation  Patient identified by MRN, date of birth, ID band Patient awake    Reviewed: Allergy & Precautions, NPO status , Patient's Chart, lab work & pertinent test results  History of Anesthesia Complications Negative for: history of anesthetic complications  Airway Mallampati: I   Neck ROM: Full    Dental  (+) Missing, Chipped   Pulmonary Current Smoker (1 ppd)Patient did not abstain from smoking.   Pulmonary exam normal breath sounds clear to auscultation       Cardiovascular negative cardio ROS Normal cardiovascular exam Rhythm:Regular Rate:Normal     Neuro/Psych  Headaches PSYCHIATRIC DISORDERS Anxiety Depression       GI/Hepatic ,GERD  ,,  Endo/Other    Class 3 obesityPrediabetes   Renal/GU negative Renal ROS     Musculoskeletal  (+)  Fibromyalgia -  Abdominal   Peds  Hematology   Anesthesia Other Findings   Reproductive/Obstetrics                             Anesthesia Physical Anesthesia Plan  ASA: 3  Anesthesia Plan: General   Post-op Pain Management:    Induction: Intravenous  PONV Risk Score and Plan: 2 and Propofol  infusion, TIVA and Treatment may vary due to age or medical condition  Airway Management Planned: Natural Airway  Additional Equipment:   Intra-op Plan:   Post-operative Plan:   Informed Consent: I have reviewed the patients History and Physical, chart, labs and discussed the procedure including the risks, benefits and alternatives for the proposed anesthesia with the patient or authorized representative who has indicated his/her understanding and acceptance.       Plan Discussed with: CRNA  Anesthesia Plan Comments: (LMA/GETA backup discussed.  Patient consented for risks of anesthesia including but not limited to:  - adverse reactions to medications - damage to eyes, teeth, lips or other oral mucosa - nerve damage due to  positioning  - sore throat or hoarseness - damage to heart, brain, nerves, lungs, other parts of body or loss of life  Informed patient about role of CRNA in peri- and intra-operative care.  Patient voiced understanding.)        Anesthesia Quick Evaluation

## 2023-05-27 NOTE — Op Note (Signed)
 [facility] Gastroenterology Patient Name: Melissa Mendez Procedure Date: 05/27/2023 9:42 AM MRN: [id number] Account #: [id number] Date of Birth: 1991/03/31 Admit Type: Outpatient Age: 33 Room: [facility] ENDO ROOM 3 Gender: Female Note Status: Finalized Instrument Name: Upper Endoscope [id number] Procedure:             Upper GI endoscopy Indications:           Iron deficiency anemia Providers:             Ruel Kung MD, MD Medicines:             Monitored Anesthesia Care Complications:         No immediate complications. Procedure:             Pre-Anesthesia Assessment:                        - Prior to the procedure, a History and Physical was                         performed, and patient medications, allergies and                         sensitivities were reviewed. The patient's tolerance                         of previous anesthesia was reviewed.                        - The risks and benefits of the procedure and the                         sedation options and risks were discussed with the                         patient. All questions were answered and informed                         consent was obtained.                        - After reviewing the risks and benefits, the patient                         was deemed in satisfactory condition to undergo the                         procedure.                        - ASA Grade Assessment: II - A patient with mild                         systemic disease.                        After obtaining informed consent, the endoscope was                         passed under direct vision. Throughout the procedure,  the patient's blood pressure, pulse, and oxygen                         saturations were monitored continuously. The                         Endosonoscope was introduced through the mouth, and                         advanced to the third part of duodenum. The upper GI                          endoscopy was accomplished with ease. The patient                         tolerated the procedure well. Findings:      The esophagus was normal.      The examined duodenum was normal.      The cardia and gastric fundus were normal on retroflexion.      The entire examined stomach was normal. Biopsies were taken with a cold       forceps for histology. Impression:            - Normal esophagus.                        - Normal examined duodenum.                        - Normal stomach. Biopsied. Recommendation:        - Await pathology results.                        - Perform a colonoscopy today. Procedure Code(s):     --- Professional ---                        [id number], Esophagogastroduodenoscopy, flexible,                         transoral; with biopsy, single or multiple Diagnosis Code(s):     --- Professional ---                        D50.9, Iron deficiency anemia, unspecified CPT copyright 2022 American Medical Association. All rights reserved. The codes documented in this report are preliminary and upon coder review may  be revised to meet current compliance requirements. Ruel Kung, MD Ruel Kung MD, MD 05/27/2023 10:34:41 AM This report has been signed electronically. Number of Addenda: 0 Note Initiated On: 05/27/2023 9:42 AM Estimated Blood Loss:  Estimated blood loss: none.      [facility]

## 2023-05-27 NOTE — H&P (Signed)
 Ruel Kung, MD [address], [place], [place], [address] [address], [address], [place], [place], [address] Phone: [phone]  Fax: [phone]  Primary Care Physician:  Lora Odor, FNP   Pre-Procedure History & Physical: HPI:  Melissa Mendez is a 33 y.o. female is here for an endoscopy and colonoscopy    Past Medical History:  Diagnosis Date   Anxiety    Depression    Fibromyalgia    Fibromyalgia    Gastroparesis    Migraines    Obesity    Pre-diabetes     Past Surgical History:  Procedure Laterality Date   COLONOSCOPY WITH PROPOFOL  N/A 03/17/2023   Procedure: COLONOSCOPY WITH PROPOFOL ;  Surgeon: Kung Ruel, MD;  Location: [facility] ENDOSCOPY;  Service: Gastroenterology;  Laterality: N/A;   ESOPHAGOGASTRODUODENOSCOPY (EGD) WITH PROPOFOL  N/A 08/27/2020   Procedure: ESOPHAGOGASTRODUODENOSCOPY (EGD) WITH PROPOFOL ;  Surgeon: Janalyn Keene NOVAK, MD;  Location: [facility];  Service: Endoscopy;  Laterality: N/A;   ESOPHAGOGASTRODUODENOSCOPY (EGD) WITH PROPOFOL  N/A 03/17/2023   Procedure: ESOPHAGOGASTRODUODENOSCOPY (EGD) WITH PROPOFOL ;  Surgeon: Kung Ruel, MD;  Location: [facility] ENDOSCOPY;  Service: Gastroenterology;  Laterality: N/A;   TONSILLECTOMY AND ADENOIDECTOMY  1993   WISDOM TOOTH EXTRACTION      Prior to Admission medications   Medication Sig Start Date End Date Taking? Authorizing Provider  DULoxetine  (CYMBALTA ) 30 MG capsule Take 2 capsules (60 mg total) by mouth daily. 12/30/22  Yes Melvin Pao, NP  esomeprazole  (NEXIUM ) 40 MG capsule Take 1 capsule (40 mg total) by mouth 2 (two) times daily before a meal. 01/25/23 07/24/23 Yes [place], PA-C  famotidine  (PEPCID ) 40 MG tablet Take 1 tablet (40 mg total) by mouth at bedtime. 12/30/22  Yes Kung Ruel, MD  gabapentin  (NEURONTIN ) 800 MG tablet TAKE 1 TABLET BY MOUTH THREE TIMES DAILY 02/23/23  Yes Melvin Pao, NP  ondansetron  (ZOFRAN -ODT) 4 MG disintegrating tablet Take 1  tablet (4 mg total) by mouth every 8 (eight) hours as needed for nausea or vomiting. 09/29/22  Yes Mecum, Erin E, PA-C  linaclotide  (LINZESS ) 145 MCG CAPS capsule Take 1 capsule (145 mcg total) by mouth daily before breakfast. 12/22/22 06/20/23  [place], PA-C  valsartan  (DIOVAN ) 80 MG tablet TAKE 1 TABLET(80 MG) BY MOUTH DAILY Patient not taking: Reported on 05/27/2023 12/30/22   Bernardo Fend, DO  varenicline  (CHANTIX ) 0.5 MG tablet Take 1 tablet (0.5 mg total) by mouth 2 (two) times daily. 10/30/22   Eappen, Saramma, MD    Allergies as of 05/26/2023 - Review Complete 05/26/2023  Allergen Reaction Noted   Sulfa antibiotics Other (See Comments) 03/27/2011    Family History  Problem Relation Age of Onset   ADD / ADHD Mother    Anxiety disorder Mother    Hypertension Father    Drug abuse Maternal Uncle    Cancer Maternal Grandfather    Multiple sclerosis Maternal Grandmother     Social History   Socioeconomic History   Marital status: Married    Spouse name: Not on file   Number of children: Not on file   Years of education: Not on file   Highest education level: Some college, no degree  Occupational History   Not on file  Tobacco Use   Smoking status: Every Day    Current packs/day: 1.00    Average packs/day: 1 pack/day for 16.0 years (16.0 ttl pk-yrs)    Types: Cigarettes    Start date: 05/2007   Smokeless tobacco: Never  Vaping Use   Vaping status: Never Used  Substance and Sexual Activity   Alcohol use: No    Alcohol/week: 0.0 standard drinks of alcohol   Drug use: No   Sexual activity: Yes  Other Topics Concern   Not on file  Social History Narrative   Not on file   Social Drivers of Health   Financial Resource Strain: Not on file  Food Insecurity: No Food Insecurity (02/18/2023)   Received from [facility]   Hunger Vital Sign    Worried About Running Out of Food in the Last Year: Never true    Ran Out of Food in the Last Year: Never true   Transportation Needs: No Transportation Needs (02/18/2023)   Received from [facility] - Transportation    Lack of Transportation (Medical): No    Lack of Transportation (Non-Medical): No  Physical Activity: Not on file  Stress: Not on file  Social Connections: Not on file  Intimate Partner Violence: Not on file    Review of Systems: See HPI, otherwise negative ROS  Physical Exam: BP (!) 152/88   Pulse 71   Temp (!) 96.3 F (35.7 C) (Temporal)   Resp 16   Wt 123.7 kg   SpO2 99%   BMI 41.48 kg/m  General:   Alert,  pleasant and cooperative in NAD Head:  Normocephalic and atraumatic. Neck:  Supple; no masses or thyromegaly. Lungs:  Clear throughout to auscultation, normal respiratory effort.    Heart:  +S1, +S2, Regular rate and rhythm, No edema. Abdomen:  Soft, nontender and nondistended. Normal bowel sounds, without guarding, and without rebound.   Neurologic:  Alert and  oriented x4;  grossly normal neurologically.  Impression/Plan: Melissa Mendez is here for an endoscopy and colonoscopy  to be performed for  evaluation of iron deficiency anemia    Risks, benefits, limitations, and alternatives regarding endoscopy have been reviewed with the patient.  Questions have been answered.  All parties agreeable.   Ruel Kung, MD  05/27/2023, 10:23 AM

## 2023-05-27 NOTE — Op Note (Signed)
 [facility] Gastroenterology Patient Name: Melissa Mendez Procedure Date: 05/27/2023 9:40 AM MRN: [id number] Account #: [id number] Date of Birth: [birth year] Admit Type: Outpatient Age: 33 Room: [facility] ENDO ROOM 3 Gender: Female Note Status: Finalized Instrument Name: Arvis [id number] Procedure:             Colonoscopy Indications:           Iron deficiency anemia Providers:             Ruel Kung MD, MD Referring MD:          No Local Md, MD (Referring MD) Medicines:             Monitored Anesthesia Care Complications:         No immediate complications. Procedure:             Pre-Anesthesia Assessment:                        - Prior to the procedure, a History and Physical was                         performed, and patient medications, allergies and                         sensitivities were reviewed. The patient's tolerance                         of previous anesthesia was reviewed.                        - The risks and benefits of the procedure and the                         sedation options and risks were discussed with the                         patient. All questions were answered and informed                         consent was obtained.                        - ASA Grade Assessment: II - A patient with mild                         systemic disease.                        After obtaining informed consent, the colonoscope was                         passed under direct vision. Throughout the procedure,                         the patient's blood pressure, pulse, and oxygen                         saturations were monitored continuously. The                         Colonoscope was introduced through  the anus with the                         intention of advancing to the cecum. The scope was                         advanced to the sigmoid colon before the procedure was                         aborted. Medications were given. The colonoscopy was                          performed with ease. The patient tolerated the                         procedure well. The quality of the bowel preparation                         was inadequate. Findings:      The perianal and digital rectal examinations were normal.      A large amount of stool was found in the recto-sigmoid colon,       interfering with visualization. Impression:            - Preparation of the colon was inadequate.                        - Stool in the recto-sigmoid colon.                        - No specimens collected. Recommendation:        - Discharge patient to home (with escort).                        - Resume previous diet.                        - Continue present medications.                        - Repeat colonoscopy in 2 weeks because the bowel                         preparation was suboptimal. Procedure Code(s):     --- Professional ---                        [id number], 53, Colonoscopy, flexible; diagnostic,                         including collection of specimen(s) by brushing or                         washing, when performed (separate procedure) Diagnosis Code(s):     --- Professional ---                        D50.9, Iron deficiency anemia, unspecified CPT copyright 2022 American Medical Association. All rights reserved. The codes documented in this report are preliminary and upon coder review may  be revised to meet current compliance requirements. Ruel Kung, MD  Ruel Kung MD, MD 05/27/2023 10:38:35 AM This report has been signed electronically. Number of Addenda: 0 Note Initiated On: 05/27/2023 9:40 AM Total Procedure Duration: 0 hours 0 minutes 42 seconds  Estimated Blood Loss:  Estimated blood loss: none.      [facility]

## 2023-05-27 NOTE — Transfer of Care (Signed)
 Immediate Anesthesia Transfer of Care Note  Patient: Melissa Mendez  Procedure(s) Performed: COLONOSCOPY ESOPHAGOGASTRODUODENOSCOPY (EGD) WITH PROPOFOL  BIOPSY  Patient Location: Endoscopy Unit  Anesthesia Type:General  Level of Consciousness: drowsy  Airway & Oxygen Therapy: Patient Spontanous Breathing  Post-op Assessment: Report given to RN  Post vital signs: stable  Last Vitals:  Vitals Value Taken Time  BP    Temp    Pulse 96 05/27/23 1041  Resp 29 05/27/23 1041  SpO2 96 % 05/27/23 1041  Vitals shown include unfiled device data.  Last Pain:  Vitals:   05/27/23 1007  TempSrc: Temporal         Complications: No notable events documented.

## 2023-05-27 NOTE — Anesthesia Postprocedure Evaluation (Signed)
 Anesthesia Post Note  Patient: Melissa Mendez  Procedure(s) Performed: COLONOSCOPY ESOPHAGOGASTRODUODENOSCOPY (EGD) WITH PROPOFOL  BIOPSY  Patient location during evaluation: PACU Anesthesia Type: General Level of consciousness: awake and alert, oriented and patient cooperative Pain management: pain level controlled Vital Signs Assessment: post-procedure vital signs reviewed and stable Respiratory status: spontaneous breathing, nonlabored ventilation and respiratory function stable Cardiovascular status: blood pressure returned to baseline and stable Postop Assessment: adequate PO intake Anesthetic complications: no   No notable events documented.   Last Vitals:  Vitals:   05/27/23 1051 05/27/23 1100  BP: 107/85 92/69  Pulse: 73   Resp: 12   Temp:    SpO2: 100%     Last Pain:  Vitals:   05/27/23 1100  TempSrc:   PainSc: 0-No pain                 Alfonso Ruths

## 2023-05-28 ENCOUNTER — Encounter: Payer: Self-pay | Admitting: Gastroenterology

## 2023-05-28 LAB — SURGICAL PATHOLOGY

## 2023-06-01 ENCOUNTER — Encounter: Payer: Self-pay | Admitting: Gastroenterology

## 2023-06-02 ENCOUNTER — Telehealth: Payer: Self-pay

## 2023-06-02 NOTE — Telephone Encounter (Signed)
Called patient but had to leave her a detailed message letting her know that Dr. Tobi Bastos wanted her to repeat her colonoscopy since her bowel preparation was suboptimal. I left her my phone number to call me back if she was interested in rescheduling.

## 2023-06-03 ENCOUNTER — Other Ambulatory Visit: Payer: Self-pay

## 2023-06-07 ENCOUNTER — Ambulatory Visit (INDEPENDENT_AMBULATORY_CARE_PROVIDER_SITE_OTHER): Payer: Medicaid Other | Admitting: Physician Assistant

## 2023-06-07 ENCOUNTER — Encounter: Payer: Self-pay | Admitting: Physician Assistant

## 2023-06-07 VITALS — BP 142/85 | HR 105 | Temp 98.4°F | Ht 68.0 in | Wt 276.8 lb

## 2023-06-07 DIAGNOSIS — R10814 Left lower quadrant abdominal tenderness: Secondary | ICD-10-CM

## 2023-06-07 DIAGNOSIS — R112 Nausea with vomiting, unspecified: Secondary | ICD-10-CM | POA: Diagnosis not present

## 2023-06-07 DIAGNOSIS — R10813 Right lower quadrant abdominal tenderness: Secondary | ICD-10-CM | POA: Diagnosis not present

## 2023-06-07 DIAGNOSIS — K219 Gastro-esophageal reflux disease without esophagitis: Secondary | ICD-10-CM | POA: Diagnosis not present

## 2023-06-07 DIAGNOSIS — R1084 Generalized abdominal pain: Secondary | ICD-10-CM

## 2023-06-07 DIAGNOSIS — K5909 Other constipation: Secondary | ICD-10-CM

## 2023-06-07 DIAGNOSIS — F1290 Cannabis use, unspecified, uncomplicated: Secondary | ICD-10-CM

## 2023-06-07 DIAGNOSIS — K828 Other specified diseases of gallbladder: Secondary | ICD-10-CM

## 2023-06-07 DIAGNOSIS — K5904 Chronic idiopathic constipation: Secondary | ICD-10-CM

## 2023-06-07 DIAGNOSIS — D510 Vitamin B12 deficiency anemia due to intrinsic factor deficiency: Secondary | ICD-10-CM

## 2023-06-07 DIAGNOSIS — E538 Deficiency of other specified B group vitamins: Secondary | ICD-10-CM

## 2023-06-07 DIAGNOSIS — E559 Vitamin D deficiency, unspecified: Secondary | ICD-10-CM

## 2023-06-07 DIAGNOSIS — D509 Iron deficiency anemia, unspecified: Secondary | ICD-10-CM

## 2023-06-07 MED ORDER — PROMETHAZINE HCL 25 MG PO TABS: 25.0000 mg | ORAL_TABLET | Freq: Four times a day (QID) | ORAL | 1 refills | Status: AC | PRN

## 2023-06-07 NOTE — Patient Instructions (Addendum)
CT scheduled 06/14/23 [facility]  arrive at 3:45 .

## 2023-06-07 NOTE — Progress Notes (Signed)
Melissa Amy, PA-C [address]  [address]  [place], [place] [address]  Main: [phone]  Fax: [phone]   Primary Care Physician: Melissa Dew, FNP  Primary Gastroenterologist:  Melissa Amy, PA-C / Dr. Wyline Mendez    CC: Follow-up GERD, constipation, anemia, Chronic N/V  HPI: Melissa Mendez is a 33 y.o. female returns for 63-month follow-up of chronic nausea, vomiting, acid reflux, constipation, iron deficiency anemia, and B12 deficiency.  History of chronic daily marijuana use for many years.  She continues to have chronic daily nausea, vomiting, and constipation.  She denies melena, hematochezia, or hematemesis.  Acid reflux has improved, yet not fully controlled on Nexium 40 Mg twice daily and famotidine 40 Mg once daily.  Linzess 290 mcg helped her constipation, but was cost prohibitive.  She wonders about OTC constipation treatment.  She vomits once or twice per week in the mornings.  Takes Zofran with little benefit.  She attempted to do colonoscopy prep twice, but could not keep the prep down due to nausea and vomiting.  She admits to bilateral lower abdominal pain which comes and goes.  01/2023 last labs: Very low vitamin D 17.2.  Started on vitamin D 50,000 units weekly for 8 weeks and then discontinued.  Not currently on vitamin D.  Hemoglobin improved from 10.7 up to 11.6.  Iron improved, yet still a little low.   B12 improved, low normal.  She is not currently on B12 or iron.  CMP normal.  Negative celiac panel 10/2022.  05/27/2023 EGD by Dr. Tobi Mendez: Normal.  Biopsies negative for H. pylori.  05/27/2023 colonoscopy: Poor prep (with 2-day prep).  Incomplete.  She needs a repeat colonoscopy with extra 2-day prep.  RUQ ultrasound 10/2022 showed sludge in the gallbladder.  No evidence of cholecystitis.  Enlarged liver 18.12 cm.  Normal echogenicity.  Normal bile duct 4.8 mm.   HIDA scan 11/19/2022 showed normal gallbladder ejection fraction 59%.  EGD 08/2020 showed LA grade  a distal esophagitis, findings concerning for esophageal candidiasis, mild gastritis, and a medium amount of food in the stomach.  Duodenal biopsies negative for celiac.  Stomach biopsies negative for H. pylori.  Esophageal brushings were negative for candidiasis.   Gastric emptying test 09/2020 was normal with no evidence of gastroparesis.  64% emptied at 1 hour, 83% emptied at 2 hours, 100% emptied at 3 hours.  Current Outpatient Medications  Medication Sig Dispense Refill   DULoxetine (CYMBALTA) 30 MG capsule Take 2 capsules (60 mg total) by mouth daily. 180 capsule 0   esomeprazole (NEXIUM) 40 MG capsule Take 1 capsule (40 mg total) by mouth 2 (two) times daily before a meal. 60 capsule 5   famotidine (PEPCID) 40 MG tablet Take 1 tablet (40 mg total) by mouth at bedtime. 90 tablet 3   gabapentin (NEURONTIN) 800 MG tablet TAKE 1 TABLET BY MOUTH THREE TIMES DAILY 270 tablet 0   linaclotide (LINZESS) 145 MCG CAPS capsule Take 1 capsule (145 mcg total) by mouth daily before breakfast. 30 capsule 5   ondansetron (ZOFRAN-ODT) 4 MG disintegrating tablet Take 1 tablet (4 mg total) by mouth every 8 (eight) hours as needed for nausea or vomiting. 20 tablet 0   valsartan (DIOVAN) 80 MG tablet TAKE 1 TABLET(80 MG) BY MOUTH DAILY 60 tablet 0   varenicline (CHANTIX) 0.5 MG tablet Take 1 tablet (0.5 mg total) by mouth 2 (two) times daily. 60 tablet 1   varenicline (CHANTIX) 0.5 MG tablet Chantix  No current facility-administered medications for this visit.    Allergies as of 06/07/2023 - Review Complete 06/07/2023  Allergen Reaction Noted   Sulfa antibiotics Other (See Comments) 03/27/2011    Past Medical History:  Diagnosis Date   Anxiety    Depression    Fibromyalgia    Migraines    Obesity    Obesity, morbid, BMI 40.0-49.9 (HCC) 02/18/2023   Pre-diabetes    Prediabetes 02/18/2023    Past Surgical History:  Procedure Laterality Date   BIOPSY  05/27/2023   Procedure: BIOPSY;  Surgeon:  Melissa Mood, MD;  Location: [facility] ENDOSCOPY;  Service: Gastroenterology;;   COLONOSCOPY N/A 05/27/2023   Procedure: COLONOSCOPY;  Surgeon: Melissa Mood, MD;  Location: [facility] ENDOSCOPY;  Service: Gastroenterology;  Laterality: N/A;   COLONOSCOPY WITH PROPOFOL N/A 03/17/2023   Procedure: COLONOSCOPY WITH PROPOFOL;  Surgeon: Melissa Mood, MD;  Location: [facility] ENDOSCOPY;  Service: Gastroenterology;  Laterality: N/A;   ESOPHAGOGASTRODUODENOSCOPY (EGD) WITH PROPOFOL N/A 08/27/2020   Procedure: ESOPHAGOGASTRODUODENOSCOPY (EGD) WITH PROPOFOL;  Surgeon: Melissa Spillers, MD;  Location: [facility] SURGERY CNTR;  Service: Endoscopy;  Laterality: N/A;   ESOPHAGOGASTRODUODENOSCOPY (EGD) WITH PROPOFOL N/A 03/17/2023   Procedure: ESOPHAGOGASTRODUODENOSCOPY (EGD) WITH PROPOFOL;  Surgeon: Melissa Mood, MD;  Location: [facility] ENDOSCOPY;  Service: Gastroenterology;  Laterality: N/A;   ESOPHAGOGASTRODUODENOSCOPY (EGD) WITH PROPOFOL  05/27/2023   Procedure: ESOPHAGOGASTRODUODENOSCOPY (EGD) WITH PROPOFOL;  Surgeon: Melissa Mood, MD;  Location: [facility] ENDOSCOPY;  Service: Gastroenterology;;   TONSILLECTOMY AND ADENOIDECTOMY  1993   WISDOM TOOTH EXTRACTION      Review of Systems:    All systems reviewed and negative except where noted in HPI.   Physical Examination:   BP (!) 142/85   Pulse (!) 105   Temp 98.4 F (36.9 C)   Ht 5\' 8"  (1.727 m)   Wt 276 lb 12.8 oz (125.6 kg)   BMI 42.09 kg/m   General: Well-nourished, well-developed in no acute distress.  Lungs: Clear to auscultation bilaterally. Non-labored. Heart: Regular rate and rhythm, no murmurs rubs or gallops.  Abdomen: Bowel sounds are normal; Abdomen is Soft; No hepatosplenomegaly, masses or hernias;  No Abdominal Tenderness; No guarding or rebound tenderness. Neuro: Alert and oriented x 3.  Grossly intact.  Psych: Alert and cooperative, normal Mendez and affect.   Imaging Studies: No results found.  Assessment and Plan:   Melissa Mendez is a 33 y.o. y/o female  presents for follow-up of:  1.  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  Recent EGD was normal.  Biopsies negative for H. pylori.  Prior Gastric Empty Test and HIDA scan normal.  Labs unrevealing.  Encouraged complete cessation of marijuana use.  Rx promethazine 25 Mg half to 1 tablet every 6 hours as needed.  I advised her to try OTC Salonpas (capsaicin) patch, apply to abdomen as needed for nausea or vomiting (for hyperemesis cannabis syndrome).  CT abdomen pelvis with contrast  2.  Bilateral lower abdominal tenderness and pain  CT abdomen pelvis with contrast  3.  GERD  Continue Nexium 40 Mg twice daily  Increase famotidine to 40 Mg twice daily.  Recommend Lifestyle Modifications to prevent Acid Reflux.  Rec. Avoid coffee, sodas, peppermint, garlic, onions, alcohol, citrus fruits, chocolate, tomatoes, fatty and spicey foods.  Avoid eating 2-3 hours before bedtime.  Encouraged weight loss.  4.  Chronic constipation  Linzess 290 helped, however was cost prohibitive.  Start OTC MiraLAX powder, 2 capfuls in a drink daily.  Start OTC Senokot 8.6 Mg 2 tablets twice daily.  Max of 4 tablets twice daily.  Advised her to drink 64 ounces of water/fluids daily.  Encouraged high-fiber diet with fruits, vegetables, and whole grains.  5.  Iron deficiency and B12 deficiency anemia  Repeat Labs: CBC, iron panel, ferritin, B12.  If low, then she will need to start iron and B12.  Patient declined to reschedule colonoscopy because she cannot tolerate colonoscopy prep.  Recommend get her nausea and vomiting under control before considering repeat colonoscopy.  6.  Gallbladder sludge  If nausea and vomiting symptoms nausea and vomiting persist after stopping marijuana, then consider referal for cholecystectomy.  7.  Vitamin D Deficiency  Lab: Vit D.  Pending results decide about starting vitamin D supplement.   Melissa Amy,  PA-C  Follow up in 1 month with TG.

## 2023-06-09 NOTE — Telephone Encounter (Signed)
Called patient again and had to leave her another voicemail to return my call. I will also send her a MyChart message to call us back to reschedule her colonoscopy.

## 2023-06-14 ENCOUNTER — Ambulatory Visit: Admission: RE | Admit: 2023-06-14 | Payer: Medicaid Other | Source: Ambulatory Visit

## 2023-06-30 ENCOUNTER — Other Ambulatory Visit: Payer: Self-pay

## 2023-07-04 NOTE — Progress Notes (Deleted)
 Melissa Canard, PA-C [address]  [address]  [place], [place] [address]  Main: [phone]  Fax: [phone]   Primary Care Physician: Suszanne Eriksson, FNP  Primary Gastroenterologist:  ***  CC: Follow-up GERD, constipation, anemia, Chronic N/V   HPI: Melissa Mendez is a 33 y.o. female returns for follow-up of chronic nausea, vomiting, acid reflux, constipation, iron deficiency anemia, and B12 deficiency.  History of chronic daily marijuana use for many years.  Currently taking Nexium 40 Mg twice daily and famotidine 40 Mg twice daily for GERD.  Linzess 290 mcg helped her constipation, but was cost prohibitive.  1 month ago she was started on OTC MiraLAX powder 2 capfuls daily plus Senokot 8.6 Mg 2 tablets twice daily, max 4 tablets twice daily.  Encouraged 64 ounces of water/fluids daily and 30 g of dietary fiber daily.  She vomits once or twice per week in the mornings.  Takes Zofran with little benefit.  Also takes promethazine as needed.  Tried Salonpas capsaicin patch for hyperemesis cannabis syndrome.  She attempted to do colonoscopy prep twice, but could not keep the prep down due to nausea and vomiting.  She admits to bilateral lower abdominal pain which comes and goes.  She had gallbladder sludge on previous gallbladder ultrasound.  Repeat labs (lipase, CBC, CMP, vitamin D, iron, B12) were ordered 06/07/2023.  Not performed.  CT abdomen pelvis with contrast was ordered 1 month ago, yet patient not performed.   01/2023 last labs: Very low vitamin D 17.2.  Started on vitamin D 50,000 units weekly for 8 weeks and then discontinued.   Hemoglobin improved from 10.7 up to 11.6.  Iron improved, yet still a little low.   B12 improved, low normal.    CMP normal.  Negative celiac panel 10/2022.   05/27/2023 EGD by Dr. Antony Baumgartner: Normal.  Biopsies negative for H. pylori.   05/27/2023 colonoscopy: Poor prep (with 2-day prep).  Incomplete.  She needs a repeat colonoscopy with extra 2-day  prep.   RUQ ultrasound 10/2022 showed sludge in the gallbladder.  No evidence of cholecystitis.  Enlarged liver 18.12 cm.  Normal echogenicity.  Normal bile duct 4.8 mm.   HIDA scan 11/19/2022 showed normal gallbladder ejection fraction 59%.   EGD 08/2020 showed LA grade a distal esophagitis, findings concerning for esophageal candidiasis, mild gastritis, and a medium amount of food in the stomach.  Duodenal biopsies negative for celiac.  Stomach biopsies negative for H. pylori.  Esophageal brushings were negative for candidiasis.   Gastric emptying test 09/2020 was normal with no evidence of gastroparesis.  64% emptied at 1 hour, 83% emptied at 2 hours, 100% emptied at 3 hours.    Current Outpatient Medications  Medication Sig Dispense Refill   DULoxetine (CYMBALTA) 30 MG capsule Take 2 capsules (60 mg total) by mouth daily. 180 capsule 0   esomeprazole (NEXIUM) 40 MG capsule Take 1 capsule (40 mg total) by mouth 2 (two) times daily before a meal. 60 capsule 5   famotidine (PEPCID) 40 MG tablet Take 1 tablet (40 mg total) by mouth at bedtime. 90 tablet 3   gabapentin (NEURONTIN) 800 MG tablet TAKE 1 TABLET BY MOUTH THREE TIMES DAILY 270 tablet 0   linaclotide (LINZESS) 145 MCG CAPS capsule Take 1 capsule (145 mcg total) by mouth daily before breakfast. 30 capsule 5   promethazine (PHENERGAN) 25 MG tablet Take 1 tablet (25 mg total) by mouth every 6 (six) hours as needed for nausea or vomiting.  60 tablet 1   valsartan (DIOVAN) 80 MG tablet TAKE 1 TABLET(80 MG) BY MOUTH DAILY 60 tablet 0   varenicline (CHANTIX) 0.5 MG tablet Take 1 tablet (0.5 mg total) by mouth 2 (two) times daily. 60 tablet 1   varenicline (CHANTIX) 0.5 MG tablet Chantix     No current facility-administered medications for this visit.    Allergies as of 07/05/2023 - Review Complete 06/07/2023  Allergen Reaction Noted   Sulfa antibiotics Other (See Comments) 03/27/2011    Past Medical History:  Diagnosis Date   Anxiety     Depression    Fibromyalgia    Migraines    Obesity    Obesity, morbid, BMI 40.0-49.9 (HCC) 02/18/2023   Pre-diabetes    Prediabetes 02/18/2023    Past Surgical History:  Procedure Laterality Date   BIOPSY  05/27/2023   Procedure: BIOPSY;  Surgeon: Luke Salaam, MD;  Location: [facility] ENDOSCOPY;  Service: Gastroenterology;;   COLONOSCOPY N/A 05/27/2023   Procedure: COLONOSCOPY;  Surgeon: Luke Salaam, MD;  Location: [facility] ENDOSCOPY;  Service: Gastroenterology;  Laterality: N/A;   COLONOSCOPY WITH PROPOFOL N/A 03/17/2023   Procedure: COLONOSCOPY WITH PROPOFOL;  Surgeon: Luke Salaam, MD;  Location: [facility] ENDOSCOPY;  Service: Gastroenterology;  Laterality: N/A;   ESOPHAGOGASTRODUODENOSCOPY (EGD) WITH PROPOFOL N/A 08/27/2020   Procedure: ESOPHAGOGASTRODUODENOSCOPY (EGD) WITH PROPOFOL;  Surgeon: Irby Mannan, MD;  Location: [facility]ERY [facility];  Service: Endoscopy;  Laterality: N/A;   ESOPHAGOGASTRODUODENOSCOPY (EGD) WITH PROPOFOL N/A 03/17/2023   Procedure: ESOPHAGOGASTRODUODENOSCOPY (EGD) WITH PROPOFOL;  Surgeon: Luke Salaam, MD;  Location: [facility] ENDOSCOPY;  Service: Gastroenterology;  Laterality: N/A;   ESOPHAGOGASTRODUODENOSCOPY (EGD) WITH PROPOFOL  05/27/2023   Procedure: ESOPHAGOGASTRODUODENOSCOPY (EGD) WITH PROPOFOL;  Surgeon: Luke Salaam, MD;  Location: [facility] ENDOSCOPY;  Service: Gastroenterology;;   TONSILLECTOMY AND ADENOIDECTOMY  1993   WISDOM TOOTH EXTRACTION      Review of Systems:    All systems reviewed and negative except where noted in HPI.   Physical Examination:   There were no vitals taken for this visit.  General: Well-nourished, well-developed in no acute distress.  Lungs: Clear to auscultation bilaterally. Non-labored. Heart: Regular rate and rhythm, no murmurs rubs or gallops.  Abdomen: Bowel sounds are normal; Abdomen is Soft; No hepatosplenomegaly, masses or hernias;  No Abdominal Tenderness; No guarding or rebound tenderness. Neuro: Alert and oriented x 3.   Grossly intact.  Psych: Alert and cooperative, normal mood and affect.   Imaging Studies: No results found.  Assessment and Plan:   Melissa Mendez is a 32 y.o. y/o female   1.  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  Recent EGD was normal.  Biopsies negative for H. pylori.  Prior Gastric Empty Test and HIDA scan normal.  Labs unrevealing.             Encouraged complete cessation of marijuana use.             Rx promethazine 25 Mg half to 1 tablet every 6 hours as needed.             I advised her to try OTC Salonpas (capsaicin) patch, apply to abdomen as needed for nausea or vomiting (for hyperemesis cannabis syndrome).             CT abdomen pelvis with contrast   2.  Bilateral lower abdominal tenderness and pain             CT abdomen pelvis with contrast   3.  GERD             Continue Nexium 40 Mg twice daily  Increase famotidine to 40 Mg twice daily.             Recommend Lifestyle Modifications to prevent Acid Reflux.  Rec. Avoid coffee, sodas, peppermint, garlic, onions, alcohol, citrus fruits, chocolate, tomatoes, fatty and spicey foods.  Avoid eating 2-3 hours before bedtime.  Encouraged weight loss.                                4.  Chronic constipation             Linzess 290 helped, however was cost prohibitive.             Start OTC MiraLAX powder, 2 capfuls in a drink daily.             Start OTC Senokot 8.6 Mg 2 tablets twice daily.  Max of 4 tablets twice daily.             Advised her to drink 64 ounces of water/fluids daily.             Encouraged high-fiber diet with fruits, vegetables, and whole grains.   5.  Iron deficiency and B12 deficiency anemia             Repeat Labs: CBC, iron panel, ferritin, B12.             If low, then she will need to start iron and B12.             Patient declined to reschedule colonoscopy because she cannot tolerate colonoscopy prep.  Recommend get her nausea and vomiting under control  before considering repeat colonoscopy.   6.  Gallbladder sludge             If nausea and vomiting symptoms nausea and vomiting persist after stopping marijuana, then consider referal for cholecystectomy.   7.  Vitamin D Deficiency             Lab: Vit D.  Pending results decide about starting vitamin D supplement.    Melissa Canard, PA-C  Follow up ***  BP check ***

## 2023-07-05 ENCOUNTER — Ambulatory Visit: Payer: Medicaid Other | Admitting: Physician Assistant

## 2023-07-24 ENCOUNTER — Other Ambulatory Visit: Payer: Self-pay | Admitting: Nurse Practitioner

## 2023-08-12 ENCOUNTER — Telehealth: Payer: Self-pay | Admitting: Psychiatry

## 2023-08-12 NOTE — Telephone Encounter (Signed)
 I have reviewed [facility] neuropsychiatric records per Dr. Cheryal Corp -dated 07/30/2023.  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  Patient initiated on Adderall 5 mg .  Will have staff contact patient to see if she wants to schedule an appointment.

## 2023-08-13 ENCOUNTER — Telehealth: Payer: Self-pay | Admitting: Psychiatry

## 2023-08-13 NOTE — Telephone Encounter (Signed)
 I Left a voicemail with PT to call us  back if she's interested in setting up an appt for 40 minutes

## 2023-09-28 ENCOUNTER — Encounter: Payer: Medicaid Other | Attending: Psychology | Admitting: Psychology

## 2023-11-11 ENCOUNTER — Other Ambulatory Visit: Payer: Self-pay | Admitting: Nurse Practitioner

## 2023-11-12 NOTE — Telephone Encounter (Signed)
 Requested Prescriptions  Refused Prescriptions Disp Refills   DULoxetine  (CYMBALTA ) 30 MG capsule [Pharmacy Med Name: DULOXETINE  DR 30MG  CAPSULES] 180 capsule 0    Sig: TAKE 2 CAPSULES BY MOUTH DAILY     Psychiatry: Antidepressants - SNRI - duloxetine  Failed - 11/12/2023  2:17 PM      Failed - Completed PHQ-2 or PHQ-9 in the last 360 days      Failed - Last BP in normal range    BP Readings from Last 1 Encounters:  06/07/23 (!) 142/85         Failed - Valid encounter within last 6 months    Recent Outpatient Visits   None            Passed - Cr in normal range and within 360 days    Creat  Date Value Ref Range Status  09/29/2022 0.97 0.50 - 0.97 mg/dL Final   Creatinine, Ser  Date Value Ref Range Status  01/25/2023 0.97 0.57 - 1.00 mg/dL Final         Passed - eGFR is 30 or above and within 360 days    GFR calc Af Amer  Date Value Ref Range Status  12/01/2019 90 >59 mL/min/1.73 Final    Comment:    **[facility] currently reports eGFR in compliance with the current**   recommendations of the [employer]. [facility] will   update reporting as new guidelines are published from the [employer]-ASN   Task force.    GFR calc non Af Amer  Date Value Ref Range Status  12/01/2019 78 >59 mL/min/1.73 Final   eGFR  Date Value Ref Range Status  01/25/2023 79 >59 mL/min/1.73 Final

## 2023-12-14 ENCOUNTER — Other Ambulatory Visit: Payer: Self-pay | Admitting: Psychiatry

## 2023-12-14 DIAGNOSIS — F17200 Nicotine dependence, unspecified, uncomplicated: Secondary | ICD-10-CM

## 2024-01-11 NOTE — Progress Notes (Deleted)
 01/11/2024 Melissa Mendez [id number] [birth year]  Gastroenterology Office Note    Referring Provider: Lora Odor, FNP Primary Care Physician:  Lora Odor, FNP  Primary GI Provider: Jinny Carmine, MD    Chief Complaint   No chief complaint on file.    History of Present Illness   Melissa Mendez is a 33 y.o. female presenting today for follow-up  Past last seen by Ellouise Console, PA on 06/07/2023.   05/27/2023 EGD by Dr. Therisa: Normal.  Biopsies negative for H. pylori.   05/27/2023 colonoscopy: Poor prep (with 2-day prep).  Incomplete.  She needs a repeat colonoscopy with extra 2-day prep.   RUQ ultrasound 10/2022 showed sludge in the gallbladder.  No evidence of cholecystitis.  Enlarged liver 18.12 cm.  Normal echogenicity.  Normal bile duct 4.8 mm.   HIDA scan 11/19/2022 showed normal gallbladder ejection fraction 59%.   EGD 08/2020 showed LA grade a distal esophagitis, findings concerning for esophageal candidiasis, mild gastritis, and a medium amount of food in the stomach.  Duodenal biopsies negative for celiac.  Stomach biopsies negative for H. pylori.  Esophageal brushings were negative for candidiasis.   Gastric emptying test 09/2020 was normal with no evidence of gastroparesis.  64% emptied at 1 hour, 83% emptied at 2 hours, 100% emptied at 3 hours.    Past Medical History:  Diagnosis Date   Anxiety    Depression    Fibromyalgia    Migraines    Obesity    Obesity, morbid, BMI 40.0-49.9 (HCC) 02/18/2023   Pre-diabetes    Prediabetes 02/18/2023    Past Surgical History:  Procedure Laterality Date   BIOPSY  05/27/2023   Procedure: BIOPSY;  Surgeon: Therisa Bi, MD;  Location: [facility] ENDOSCOPY;  Service: Gastroenterology;;   COLONOSCOPY N/A 05/27/2023   Procedure: COLONOSCOPY;  Surgeon: Therisa Bi, MD;  Location: [facility] ENDOSCOPY;  Service: Gastroenterology;  Laterality: N/A;   COLONOSCOPY WITH PROPOFOL  N/A 03/17/2023   Procedure: COLONOSCOPY WITH PROPOFOL ;   Surgeon: Therisa Bi, MD;  Location: [facility] ENDOSCOPY;  Service: Gastroenterology;  Laterality: N/A;   ESOPHAGOGASTRODUODENOSCOPY (EGD) WITH PROPOFOL  N/A 08/27/2020   Procedure: ESOPHAGOGASTRODUODENOSCOPY (EGD) WITH PROPOFOL ;  Surgeon: Janalyn Keene NOVAK, MD;  Location: [facility];  Service: Endoscopy;  Laterality: N/A;   ESOPHAGOGASTRODUODENOSCOPY (EGD) WITH PROPOFOL  N/A 03/17/2023   Procedure: ESOPHAGOGASTRODUODENOSCOPY (EGD) WITH PROPOFOL ;  Surgeon: Therisa Bi, MD;  Location: [facility] ENDOSCOPY;  Service: Gastroenterology;  Laterality: N/A;   ESOPHAGOGASTRODUODENOSCOPY (EGD) WITH PROPOFOL   05/27/2023   Procedure: ESOPHAGOGASTRODUODENOSCOPY (EGD) WITH PROPOFOL ;  Surgeon: Therisa Bi, MD;  Location: [facility] ENDOSCOPY;  Service: Gastroenterology;;   TONSILLECTOMY AND ADENOIDECTOMY  1993   WISDOM TOOTH EXTRACTION      Current Outpatient Medications  Medication Sig Dispense Refill   DULoxetine  (CYMBALTA ) 30 MG capsule Take 2 capsules (60 mg total) by mouth daily. 180 capsule 0   esomeprazole  (NEXIUM ) 40 MG capsule Take 1 capsule (40 mg total) by mouth 2 (two) times daily before a meal. 60 capsule 5   famotidine  (PEPCID ) 40 MG tablet Take 1 tablet (40 mg total) by mouth at bedtime. 90 tablet 3   gabapentin  (NEURONTIN ) 800 MG tablet TAKE 1 TABLET BY MOUTH THREE TIMES DAILY 270 tablet 0   linaclotide  (LINZESS ) 145 MCG CAPS capsule Take 1 capsule (145 mcg total) by mouth daily before breakfast. 30 capsule 5   promethazine  (PHENERGAN ) 25 MG tablet Take 1 tablet (25 mg total) by mouth every 6 (six) hours as needed for nausea or vomiting. 60  tablet 1   valsartan  (DIOVAN ) 80 MG tablet TAKE 1 TABLET(80 MG) BY MOUTH DAILY 60 tablet 0   varenicline  (CHANTIX ) 0.5 MG tablet Take 1 tablet (0.5 mg total) by mouth 2 (two) times daily. 60 tablet 1   varenicline  (CHANTIX ) 0.5 MG tablet Chantix      No current facility-administered medications for this visit.    Allergies as of 01/12/2024 - Review Complete  06/07/2023  Allergen Reaction Noted   Sulfa antibiotics Other (See Comments) 03/27/2011    Family History  Problem Relation Age of Onset   ADD / ADHD Mother    Anxiety disorder Mother    Hypertension Father    Drug abuse Maternal Uncle    Cancer Maternal Grandfather    Multiple sclerosis Maternal Grandmother     Social History   Socioeconomic History   Marital status: Married    Spouse name: Not on file   Number of children: Not on file   Years of education: Not on file   Highest education level: Some college, no degree  Occupational History   Not on file  Tobacco Use   Smoking status: Every Day    Current packs/day: 1.00    Average packs/day: 1 pack/day for 16.6 years (16.6 ttl pk-yrs)    Types: Cigarettes    Start date: 05/2007   Smokeless tobacco: Never  Vaping Use   Vaping status: Never Used  Substance and Sexual Activity   Alcohol use: No    Alcohol/week: 0.0 standard drinks of alcohol   Drug use: No   Sexual activity: Yes  Other Topics Concern   Not on file  Social History Narrative   Not on file   Social Drivers of Health   Financial Resource Strain: Not on file  Food Insecurity: No Food Insecurity (02/18/2023)   Received from [facility]   Hunger Vital Sign    Within the past 12 months, you worried that your food would run out before you got the money to buy more.: Never true    Within the past 12 months, the food you bought just didn't last and you didn't have money to get more.: Never true  Transportation Needs: No Transportation Needs (02/18/2023)   Received from [facility] - Transportation    Lack of Transportation (Medical): No    Lack of Transportation (Non-Medical): No  Physical Activity: Not on file  Stress: Not on file  Social Connections: Not on file  Intimate Partner Violence: Not on file     RELEVANT GI HISTORY, IMAGING AND LABS: CBC    Component Value Date/Time   WBC 7.3 01/25/2023 1415   WBC 7.9 09/29/2022  1225   RBC 4.38 01/25/2023 1415   RBC 4.70 09/29/2022 1225   HGB 11.6 01/25/2023 1415   HCT 36.2 01/25/2023 1415   PLT 245 01/25/2023 1415   MCV 83 01/25/2023 1415   MCH 26.5 (L) 01/25/2023 1415   MCH 25.3 (L) 09/29/2022 1225   MCHC 32.0 01/25/2023 1415   MCHC 31.3 (L) 09/29/2022 1225   RDW 14.6 01/25/2023 1415   LYMPHSABS 3.0 01/25/2023 1415   MONOABS 0.4 11/05/2015 2053   EOSABS 0.4 01/25/2023 1415   BASOSABS 0.1 01/25/2023 1415   Recent Labs    01/25/23 1415  HGB 11.6    CMP     Component Value Date/Time   NA 140 01/25/2023 1415   K 4.5 01/25/2023 1415   CL 104 01/25/2023 1415   CO2 23 01/25/2023  1415   GLUCOSE 97 01/25/2023 1415   GLUCOSE 103 (H) 09/29/2022 1225   BUN 6 01/25/2023 1415   CREATININE 0.97 01/25/2023 1415   CREATININE 0.97 09/29/2022 1225   CALCIUM 9.7 01/25/2023 1415   PROT 6.4 01/25/2023 1415   ALBUMIN 4.1 01/25/2023 1415   AST 15 01/25/2023 1415   ALT 15 01/25/2023 1415   ALKPHOS 55 01/25/2023 1415   BILITOT 0.2 01/25/2023 1415   GFRNONAA 78 12/01/2019 1449   GFRAA 90 12/01/2019 1449      Latest Ref Rng & Units 01/25/2023    2:15 PM 09/29/2022   12:25 PM 06/18/2022   11:20 AM  Hepatic Function  Total Protein 6.0 - 8.5 g/dL 6.4  6.9  7.4   Albumin 3.9 - 4.9 g/dL 4.1   4.7   AST 0 - 40 IU/L 15  12  24    ALT 0 - 32 IU/L 15  14  27    Alk Phosphatase 44 - 121 IU/L 55   81   Total Bilirubin 0.0 - 1.2 mg/dL 0.2  0.2  <9.7       Review of Systems   All systems reviewed and negative except where noted in HPI.    Physical Exam  There were no vitals taken for this visit. No LMP recorded. General:   Alert and oriented. Pleasant and cooperative. Well-nourished and well-developed.  Head:  Normocephalic and atraumatic. Eyes:  Without icterus Ears:  Normal auditory acuity. Neck:  Supple; no masses or thyromegaly. Lungs:  Respirations even and unlabored.  Clear throughout to auscultation.   No wheezes, crackles, or rhonchi. No acute  distress. Heart:  Regular rate and rhythm; no murmurs, clicks, rubs, or gallops. Abdomen:  Normal bowel sounds.  No bruits.  Soft, non-tender and non-distended without masses, hepatosplenomegaly or hernias noted.  No guarding or rebound tenderness.  ***Negative Carnett sign.   Rectal:  Deferred.***  Msk:  Symmetrical without gross deformities. Normal posture. Extremities:  Without edema. Neurologic:  Alert and  oriented x4;  grossly normal neurologically. Skin:  Intact without significant lesions or rashes. Psych:  Alert and cooperative. Normal mood and affect.   Assessment & Plan   Melissa Mendez is a 33 y.o. female presenting today with    I discussed the assessment and treatment plan with the patient. The patient was provided an opportunity to ask questions and all were answered. The patient agreed with the plan and demonstrated an understanding of the instructions.   The patient was advised to call back or seek an in-person evaluation if the symptoms worsen or if the condition fails to improve as anticipated.  Grayce Bohr, DNP, AGNP-C [facility]

## 2024-01-12 ENCOUNTER — Ambulatory Visit: Admitting: Family Medicine
